# Patient Record
Sex: Female | Born: 1960
Health system: Southern US, Community
[De-identification: ages and names within clinical notes are randomized; demographics above are authoritative.]

## PROBLEM LIST (undated history)

## (undated) DIAGNOSIS — R011 Cardiac murmur, unspecified: Secondary | ICD-10-CM

## (undated) DIAGNOSIS — N816 Rectocele: Secondary | ICD-10-CM

## (undated) DIAGNOSIS — Z86010 Personal history of colonic polyps: Secondary | ICD-10-CM

## (undated) DIAGNOSIS — M6530 Trigger finger, unspecified finger: Secondary | ICD-10-CM

## (undated) DIAGNOSIS — M1990 Unspecified osteoarthritis, unspecified site: Secondary | ICD-10-CM

## (undated) DIAGNOSIS — I639 Cerebral infarction, unspecified: Secondary | ICD-10-CM

## (undated) DIAGNOSIS — G4700 Insomnia, unspecified: Secondary | ICD-10-CM

## (undated) DIAGNOSIS — M792 Neuralgia and neuritis, unspecified: Secondary | ICD-10-CM

## (undated) DIAGNOSIS — N200 Calculus of kidney: Secondary | ICD-10-CM

## (undated) DIAGNOSIS — I10 Essential (primary) hypertension: Secondary | ICD-10-CM

## (undated) DIAGNOSIS — G459 Transient cerebral ischemic attack, unspecified: Secondary | ICD-10-CM

## (undated) DIAGNOSIS — N289 Disorder of kidney and ureter, unspecified: Secondary | ICD-10-CM

## (undated) DIAGNOSIS — E785 Hyperlipidemia, unspecified: Secondary | ICD-10-CM

## (undated) DIAGNOSIS — K589 Irritable bowel syndrome without diarrhea: Secondary | ICD-10-CM

## (undated) DIAGNOSIS — G43909 Migraine, unspecified, not intractable, without status migrainosus: Secondary | ICD-10-CM

## (undated) DIAGNOSIS — M797 Fibromyalgia: Secondary | ICD-10-CM

## (undated) DIAGNOSIS — F419 Anxiety disorder, unspecified: Secondary | ICD-10-CM

## (undated) HISTORY — DX: Anxiety disorder, unspecified: F41.9

## (undated) HISTORY — DX: Cardiac murmur, unspecified: R01.1

## (undated) HISTORY — DX: Calculus of kidney: N20.0

## (undated) HISTORY — DX: Hyperlipidemia, unspecified: E78.5

## (undated) HISTORY — DX: Cerebral infarction, unspecified: I63.9

## (undated) HISTORY — DX: Insomnia, unspecified: G47.00

## (undated) HISTORY — DX: Irritable bowel syndrome, unspecified: K58.9

---
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
## 2009-02-27 ENCOUNTER — Ambulatory Visit: Payer: Self-pay | Admitting: Radiology

## 2009-02-27 ENCOUNTER — Emergency Department (HOSPITAL_BASED_OUTPATIENT_CLINIC_OR_DEPARTMENT_OTHER): Admission: EM | Admit: 2009-02-27 | Discharge: 2009-02-27 | Payer: Self-pay | Admitting: Emergency Medicine

## 2010-03-01 ENCOUNTER — Emergency Department (HOSPITAL_COMMUNITY)
Admission: EM | Admit: 2010-03-01 | Discharge: 2010-03-01 | Payer: Self-pay | Source: Home / Self Care | Admitting: Family Medicine

## 2010-04-01 HISTORY — PX: ABDOMINAL HYSTERECTOMY: SHX81

## 2010-04-25 ENCOUNTER — Emergency Department (HOSPITAL_COMMUNITY)
Admission: EM | Admit: 2010-04-25 | Discharge: 2010-04-26 | Payer: Self-pay | Source: Home / Self Care | Admitting: Emergency Medicine

## 2010-04-25 LAB — BASIC METABOLIC PANEL
BUN: 13 mg/dL (ref 6–23)
Calcium: 8.9 mg/dL (ref 8.4–10.5)
GFR calc non Af Amer: >60 mL/min (ref >60)
Glucose, Bld: 144 mg/dL — ABNORMAL HIGH (ref 70–99)
Potassium: 3.6 mEq/L (ref 3.5–5.1)
Sodium: 132 mEq/L — ABNORMAL LOW (ref 135–145)

## 2010-04-25 LAB — DIFFERENTIAL
Basophils Absolute: 0 10*3/uL (ref 0.0–0.1)
Eosinophils Absolute: 0.2 10*3/uL (ref 0.0–0.7)
Eosinophils Relative: 3 % (ref 0–5)
Monocytes Absolute: 0.3 10*3/uL (ref 0.1–1.0)

## 2010-04-25 LAB — CBC
MCHC: 32.4 g/dL (ref 30.0–36.0)
Platelets: 246 10*3/uL (ref 150–400)
RDW: 12.8 % (ref 11.5–15.5)
WBC: 5.9 10*3/uL (ref 4.0–10.5)

## 2010-07-04 LAB — DIFFERENTIAL
Basophils Absolute: 0.1 10*3/uL (ref 0.0–0.1)
Basophils Relative: 1 % (ref 0–1)
Eosinophils Absolute: 0.1 10*3/uL (ref 0.0–0.7)
Eosinophils Relative: 2 % (ref 0–5)
Monocytes Absolute: 0.4 10*3/uL (ref 0.1–1.0)
Monocytes Relative: 5 % (ref 3–12)

## 2010-07-04 LAB — COMPREHENSIVE METABOLIC PANEL
ALT: 10 U/L (ref 0–35)
Albumin: 4.1 g/dL (ref 3.5–5.2)
Alkaline Phosphatase: 85 U/L (ref 39–117)
BUN: 11 mg/dL (ref 6–23)
Chloride: 103 mEq/L (ref 96–112)
Potassium: 4.5 mEq/L (ref 3.5–5.1)
Sodium: 141 mEq/L (ref 135–145)
Total Bilirubin: 0.4 mg/dL (ref 0.3–1.2)
Total Protein: 7.4 g/dL (ref 6.0–8.3)

## 2010-07-04 LAB — CBC
HCT: 39.6 % (ref 36.0–46.0)
Hemoglobin: 13.1 g/dL (ref 12.0–15.0)
Platelets: 220 10*3/uL (ref 150–400)
RDW: 12.9 % (ref 11.5–15.5)
WBC: 7.5 10*3/uL (ref 4.0–10.5)

## 2010-07-04 LAB — URINALYSIS, ROUTINE W REFLEX MICROSCOPIC
Bilirubin Urine: NEGATIVE
Glucose, UA: NEGATIVE mg/dL
Hgb urine dipstick: NEGATIVE
Ketones, ur: NEGATIVE mg/dL
Protein, ur: NEGATIVE mg/dL
pH: 6 (ref 5.0–8.0)

## 2010-10-12 ENCOUNTER — Inpatient Hospital Stay (INDEPENDENT_AMBULATORY_CARE_PROVIDER_SITE_OTHER)
Admission: RE | Admit: 2010-10-12 | Discharge: 2010-10-12 | Disposition: A | Discharge status: Home / Self Care | Payer: 59 | Source: Ambulatory Visit | Attending: Family Medicine | Admitting: Family Medicine

## 2010-10-12 DIAGNOSIS — R252 Cramp and spasm: Secondary | ICD-10-CM

## 2011-10-23 ENCOUNTER — Emergency Department (HOSPITAL_COMMUNITY)
Admission: EM | Admit: 2011-10-23 | Discharge: 2011-10-24 | Disposition: A | Discharge status: Home / Self Care | Payer: 59 | Attending: Emergency Medicine | Admitting: Emergency Medicine

## 2011-10-23 ENCOUNTER — Emergency Department (HOSPITAL_COMMUNITY)
Admission: EM | Admit: 2011-10-23 | Discharge: 2011-10-23 | Disposition: A | Discharge status: Nursing Home (Includes ICF) | Payer: 59 | Source: Home / Self Care | Attending: Emergency Medicine | Admitting: Emergency Medicine

## 2011-10-23 ENCOUNTER — Encounter (HOSPITAL_COMMUNITY): Payer: Self-pay | Admitting: *Deleted

## 2011-10-23 ENCOUNTER — Encounter (HOSPITAL_COMMUNITY): Payer: Self-pay

## 2011-10-23 DIAGNOSIS — R1032 Left lower quadrant pain: Secondary | ICD-10-CM | POA: Insufficient documentation

## 2011-10-23 DIAGNOSIS — E119 Type 2 diabetes mellitus without complications: Secondary | ICD-10-CM | POA: Insufficient documentation

## 2011-10-23 DIAGNOSIS — I10 Essential (primary) hypertension: Secondary | ICD-10-CM | POA: Insufficient documentation

## 2011-10-23 DIAGNOSIS — K5900 Constipation, unspecified: Secondary | ICD-10-CM | POA: Insufficient documentation

## 2011-10-23 DIAGNOSIS — R1031 Right lower quadrant pain: Secondary | ICD-10-CM | POA: Insufficient documentation

## 2011-10-23 DIAGNOSIS — R109 Unspecified abdominal pain: Secondary | ICD-10-CM

## 2011-10-23 DIAGNOSIS — Z79899 Other long term (current) drug therapy: Secondary | ICD-10-CM | POA: Insufficient documentation

## 2011-10-23 HISTORY — DX: Neuralgia and neuritis, unspecified: M79.2

## 2011-10-23 HISTORY — DX: Essential (primary) hypertension: I10

## 2011-10-23 LAB — COMPREHENSIVE METABOLIC PANEL
CO2: 24 mEq/L (ref 19–32)
Calcium: 9.3 mg/dL (ref 8.4–10.5)
Creatinine, Ser: 0.75 mg/dL (ref 0.50–1.10)
GFR calc Af Amer: >90 mL/min (ref >90)
GFR calc non Af Amer: >90 mL/min (ref >90)
Glucose, Bld: 89 mg/dL (ref 70–99)
Total Protein: 7.9 g/dL (ref 6.0–8.3)

## 2011-10-23 LAB — URINALYSIS, ROUTINE W REFLEX MICROSCOPIC
Leukocytes, UA: NEGATIVE
Protein, ur: NEGATIVE mg/dL
Urobilinogen, UA: 0.2 mg/dL (ref 0.0–1.0)

## 2011-10-23 LAB — DIFFERENTIAL
Basophils Absolute: 0.1 10*3/uL (ref 0.0–0.1)
Eosinophils Absolute: 0.1 10*3/uL (ref 0.0–0.7)
Eosinophils Relative: 1 % (ref 0–5)
Lymphocytes Relative: 51 % — ABNORMAL HIGH (ref 12–46)
Lymphs Abs: 2.7 10*3/uL (ref 0.7–4.0)
Monocytes Absolute: 0.2 10*3/uL (ref 0.1–1.0)

## 2011-10-23 LAB — CBC
HCT: 40.3 % (ref 36.0–46.0)
MCH: 28.5 pg (ref 26.0–34.0)
MCV: 85 fL (ref 78.0–100.0)
RDW: 12.9 % (ref 11.5–15.5)
WBC: 5.2 10*3/uL (ref 4.0–10.5)

## 2011-10-23 LAB — POCT URINALYSIS DIP (DEVICE)
Hgb urine dipstick: NEGATIVE
Ketones, ur: NEGATIVE mg/dL
Protein, ur: NEGATIVE mg/dL
Specific Gravity, Urine: 1.015 (ref 1.005–1.030)

## 2011-10-23 NOTE — ED Notes (Signed)
i attempted to startan iv. The pt has been stuck x3  Previously and no vein is visible.  Iv team called

## 2011-10-23 NOTE — ED Provider Notes (Signed)
History     CSN: [id number]  Arrival date & time 10/23/11  1914   First MD Initiated Contact with Patient 10/23/11 2141      Chief Complaint  Patient presents with  . Abdominal Pain    (Consider location/radiation/quality/duration/timing/severity/associated sxs/prior treatment) Patient is a 51 y.o. female presenting with abdominal pain. The history is provided by the patient.  Abdominal Pain The primary symptoms of the illness include abdominal pain. The primary symptoms of the illness do not include fever, fatigue, shortness of breath, nausea, vomiting, diarrhea, hematemesis, hematochezia, dysuria or vaginal discharge. The current episode started yesterday. The onset of the illness was gradual. The problem has not changed since onset. The abdominal pain began yesterday. The abdominal pain is located in the LLQ and RLQ. The abdominal pain radiates to the epigastric region. The abdominal pain is relieved by nothing. The abdominal pain is exacerbated by eating.  The patient states that she believes she is currently not pregnant. The patient has not had a change in bowel habit. Symptoms associated with the illness do not include chills, diaphoresis, hematuria or back pain. Significant associated medical issues include diabetes.    Past Medical History  Diagnosis Date  . Diabetes mellitus   . Hypertension   . Nerve pain     Past Surgical History  Procedure Date  . Abdominal hysterectomy     History reviewed. No pertinent family history.  History  Substance Use Topics  . Smoking status: Never Smoker   . Smokeless tobacco: Not on file  . Alcohol Use: No    OB History    Grav Para Term Preterm Abortions TAB SAB Ect Mult Living                  Review of Systems  Constitutional: Negative for fever, chills, diaphoresis and fatigue.  HENT: Negative for ear pain, congestion, sore throat, facial swelling, mouth sores, trouble swallowing, neck pain and neck stiffness.   Eyes:  Negative.   Respiratory: Negative for apnea, cough, chest tightness, shortness of breath and wheezing.   Cardiovascular: Negative for chest pain, palpitations and leg swelling.  Gastrointestinal: Positive for abdominal pain. Negative for nausea, vomiting, diarrhea, blood in stool, hematochezia, abdominal distention and hematemesis.  Genitourinary: Negative for dysuria, hematuria, flank pain, vaginal discharge, difficulty urinating and menstrual problem.  Musculoskeletal: Negative for back pain and gait problem.  Skin: Negative for rash and wound.  Neurological: Negative for dizziness, tremors, seizures, syncope, facial asymmetry, numbness and headaches.  Psychiatric/Behavioral: Negative.   All other systems reviewed and are negative.    Allergies  Enalapril and Relpax  Home Medications   Current Outpatient Rx  Name Route Sig Dispense Refill  . LIPOIC ACID PO Oral Take 1 tablet by mouth daily.    . ASPIRIN EC 81 MG PO TBEC Oral Take 81 mg by mouth every morning.    . DESIPRAMINE HCL PO Oral Take 1 tablet by mouth 3 (three) times daily.    . OMEGA-3 FATTY ACIDS 1000 MG PO CAPS Oral Take 1 g by mouth 3 (three) times daily.     Marland Kitchen GABAPENTIN 600 MG PO TABS Oral Take 600 mg by mouth 3 (three) times daily.    Marland Kitchen GINKGO BILOBA COMPLEX PO Oral Take 1 capsule by mouth daily.     . MELOXICAM 7.5 MG PO TABS Oral Take 7.5 mg by mouth daily as needed. For pain    . METFORMIN HCL 500 MG PO TABS Oral Take 500 mg  by mouth 2 (two) times daily with a meal.    . ADULT MULTIVITAMIN W/MINERALS CH Oral Take 1 tablet by mouth daily.    Marland Kitchen PROPRANOLOL HCL 60 MG PO TABS Oral Take 60 mg by mouth 2 (two) times daily.    . TRAMADOL HCL 50 MG PO TABS Oral Take 50 mg by mouth every 6 (six) hours as needed. For pain    . VITAMIN C 500 MG PO TABS Oral Take 500 mg by mouth daily.      BP 147/116  Pulse 73  Temp 97.6 F (36.4 C) (Oral)  Resp 18  SpO2 96%  Physical Exam  Nursing note and vitals  reviewed. Constitutional: She is oriented to person, place, and time. She appears well-developed and well-nourished. No distress.  HENT:  Head: Normocephalic and atraumatic.  Right Ear: External ear normal.  Left Ear: External ear normal.  Nose: Nose normal.  Mouth/Throat: Oropharynx is clear and moist. No oropharyngeal exudate.  Eyes: Conjunctivae and EOM are normal. Pupils are equal, round, and reactive to light. Right eye exhibits no discharge. Left eye exhibits no discharge.  Neck: Normal range of motion. Neck supple. No JVD present. No tracheal deviation present. No thyromegaly present.  Cardiovascular: Normal rate, regular rhythm, normal heart sounds and intact distal pulses.  Exam reveals no gallop and no friction rub.   No murmur heard. Pulmonary/Chest: Effort normal and breath sounds normal. No respiratory distress. She has no wheezes. She has no rales. She exhibits no tenderness.  Abdominal: Soft. Bowel sounds are normal. She exhibits no distension. There is tenderness in the right lower quadrant and left lower quadrant. There is no rebound and no guarding.  Genitourinary: Vagina normal and uterus normal. Uterus is not deviated. Cervix exhibits no motion tenderness, no discharge and no friability. Right adnexum displays no mass. Left adnexum displays no mass. No erythema or tenderness around the vagina. No foreign body around the vagina. No signs of injury around the vagina. No vaginal discharge found.       RLQ and LLQ pain   Musculoskeletal: Normal range of motion.  Lymphadenopathy:    She has no cervical adenopathy.  Neurological: She is alert and oriented to person, place, and time. No cranial nerve deficit. Coordination normal.  Skin: Skin is warm. No rash noted. She is not diaphoretic.  Psychiatric: She has a normal mood and affect. Her behavior is normal. Judgment and thought content normal.    ED Course  Procedures (including critical care time)  Labs Reviewed   DIFFERENTIAL - Abnormal; Notable for the following:    Neutrophils Relative 42 (*)     Lymphocytes Relative 51 (*)     All other components within normal limits  CBC  COMPREHENSIVE METABOLIC PANEL  URINALYSIS, ROUTINE W REFLEX MICROSCOPIC   No results found.   No diagnosis found.    MDM  51 year old female patient with past medical history diabetes hypertension presents as a transfer from urgent care clinic with lower abdominal pain. Patient says lower bowel pain started last night next left and right sides associated with some epigastric pain. Patient nausea but not vomiting pain is slightly worse with eating no fevers. No vaginal discharge or vaginal bleeding. GU exam normal as above with just right and left lower bowel pain. No bleeding in stool patient with normal bowel movement 2 days ago. Patient with rebound tenderness significant lower quadrant pain differential includes possible appendicitis or diverticulitis we'll assess with CT. Results for orders placed during  the hospital encounter of 10/23/11  CBC      Component Value Range   WBC 5.2  4.0 - 10.5 K/uL   RBC 4.74  3.87 - 5.11 MIL/uL   Hemoglobin 13.5  12.0 - 15.0 g/dL   HCT 16.1  09.6 - 04.5 %   MCV 85.0  78.0 - 100.0 fL   MCH 28.5  26.0 - 34.0 pg   MCHC 33.5  30.0 - 36.0 g/dL   RDW 40.9  81.1 - 91.4 %   Platelets 202  150 - 400 K/uL  DIFFERENTIAL      Component Value Range   Neutrophils Relative 42 (*) 43 - 77 %   Neutro Abs 2.2  1.7 - 7.7 K/uL   Lymphocytes Relative 51 (*) 12 - 46 %   Lymphs Abs 2.7  0.7 - 4.0 K/uL   Monocytes Relative 4  3 - 12 %   Monocytes Absolute 0.2  0.1 - 1.0 K/uL   Eosinophils Relative 1  0 - 5 %   Eosinophils Absolute 0.1  0.0 - 0.7 K/uL   Basophils Relative 1  0 - 1 %   Basophils Absolute 0.1  0.0 - 0.1 K/uL  COMPREHENSIVE METABOLIC PANEL      Component Value Range   Sodium 135  135 - 145 mEq/L   Potassium 4.2  3.5 - 5.1 mEq/L   Chloride 99  96 - 112 mEq/L   CO2 24  19 - 32  mEq/L   Glucose, Bld 89  70 - 99 mg/dL   BUN 11  6 - 23 mg/dL   Creatinine, Ser 7.82  0.50 - 1.10 mg/dL   Calcium 9.3  8.4 - 95.6 mg/dL   Total Protein 7.9  6.0 - 8.3 g/dL   Albumin 3.8  3.5 - 5.2 g/dL   AST 19  0 - 37 U/L   ALT 12  0 - 35 U/L   Alkaline Phosphatase 78  39 - 117 U/L   Total Bilirubin 0.3  0.3 - 1.2 mg/dL   GFR calc non Af Amer >90  >90 mL/min   GFR calc Af Amer >90  >90 mL/min  URINALYSIS, ROUTINE W REFLEX MICROSCOPIC      Component Value Range   Color, Urine YELLOW  YELLOW   APPearance CLEAR  CLEAR   Specific Gravity, Urine 1.008  1.005 - 1.030   pH 5.5  5.0 - 8.0   Glucose, UA NEGATIVE  NEGATIVE mg/dL   Hgb urine dipstick NEGATIVE  NEGATIVE   Bilirubin Urine NEGATIVE  NEGATIVE   Ketones, ur NEGATIVE  NEGATIVE mg/dL   Protein, ur NEGATIVE  NEGATIVE mg/dL   Urobilinogen, UA 0.2  0.0 - 1.0 mg/dL   Nitrite NEGATIVE  NEGATIVE   Leukocytes, UA NEGATIVE  NEGATIVE  ]   Labs normal patient without urinary tract infection. CT results of abdomen still pending at this time will be followed up by Dr. Patria Mane or Dr. Rubin Payor.  Case discussed with Dr. Felicity Coyer, MD 10/23/11 2325

## 2011-10-23 NOTE — ED Notes (Signed)
Pt was sent here from ucc with lower abd pain she has had for several days.  Alert skin warm and dry no distress

## 2011-10-23 NOTE — ED Notes (Signed)
C/o lower abdominal cramping, bloating and nausea since last night.  Denies diarrhea, vomiting, fever or dysuria.  States she was constipated and had enema with good results on Monday.  States normal soft BM this am.

## 2011-10-23 NOTE — ED Notes (Signed)
Pt waiting for results no difficulies

## 2011-10-23 NOTE — ED Provider Notes (Signed)
Chief Complaint  Patient presents with  . Abdominal Pain    History of Present Illness:    The patient is a 51 year old female who has a history since last night of bilateral lower abdominal pain without radiation. The pain is severe, constant, and aching. Nothing particularly makes it worse or better. Her abdomen feels swollen and tight. She's felt nauseated but has not vomited. She notes anorexia. She feels weak and has broken out in a sweat she denies any fever. She tends to have chronic constipation problems but this is no different from usual and she denies diarrhea or blood in the stool. She's had no urinary symptoms or GYN complaints. She is status post hysterectomy 2 years ago.  Review of Systems:  Other than noted above, the patient denies any of the following symptoms: Constitutional:  No fever, chills, fatigue, weight loss or anorexia. Lungs:  No cough or shortness of breath. Heart:  No chest pain, palpitations, syncope or edema.  No cardiac history. Abdomen:  No nausea, vomiting, hematememesis, melena, diarrhea, or hematochezia. GU:  No dysuria, frequency, urgency, or hematuria. Gyn:  No vaginal discharge, itching, abnormal bleeding, dyspareunia, or pelvic pain. Skin:  No rash or itching.  PMFSH:  Past medical history, family history, social history, meds, and allergies were reviewed along with nurse's notes.  No prior abdominal surgeries, past history of GI problems, STDs or GYN problems.  No history of aspirin or NSAID use.  No excessive alcohol intake.  Physical Exam:   Vital signs:  BP 161/88  Pulse 66  Temp 98.3 F (36.8 C) (Oral)  Resp 20  SpO2 99% Gen:  Alert, oriented, in no distress. Lungs:  Breath sounds clear and equal bilaterally.  No wheezes, rales or rhonchi. Heart:  Regular rhythm.  No gallops or murmers.   Abdomen:  Abdomen is slightly distended, soft, and has tenderness with guarding but no rebound in both lower quadrants and the epigastrium. Bowel sounds are  active. No organomegaly or mass. Skin:  Clear, warm and dry.  No rash.  Labs:   Results for orders placed during the hospital encounter of 10/23/11  POCT URINALYSIS DIP (DEVICE)      Component Value Range   Glucose, UA NEGATIVE  NEGATIVE mg/dL   Bilirubin Urine NEGATIVE  NEGATIVE   Ketones, ur NEGATIVE  NEGATIVE mg/dL   Specific Gravity, Urine 1.015  1.005 - 1.030   Hgb urine dipstick NEGATIVE  NEGATIVE   pH 7.0  5.0 - 8.0   Protein, ur NEGATIVE  NEGATIVE mg/dL   Urobilinogen, UA 0.2  0.0 - 1.0 mg/dL   Nitrite NEGATIVE  NEGATIVE   Leukocytes, UA NEGATIVE  NEGATIVE    Assessment:  The encounter diagnosis was Abdominal pain. My differential diagnosis is diverticulitis, appendicitis, or intestinal obstruction. She will need further evaluation at the emergency department for this.  Plan:   1.  The following meds were prescribed:   New Prescriptions   No medications on file   2.  The patient was transferred via shuttle to the emergency department.   Reuben Likes, MD 10/23/11 1900

## 2011-10-23 NOTE — ED Notes (Addendum)
Pt from Urgent Care.  Pt c/o lower abdominal pain that radiates to epigastric area, nausea with no vomiting.  Denies SOB, CP, diarrhea, dysuria.  Urinalysis done today at urgent care.

## 2011-10-24 ENCOUNTER — Encounter (HOSPITAL_COMMUNITY): Payer: Self-pay | Admitting: Radiology

## 2011-10-24 ENCOUNTER — Emergency Department (HOSPITAL_COMMUNITY): Payer: 59

## 2011-10-24 LAB — GC/CHLAMYDIA PROBE AMP, GENITAL: GC Probe Amp, Genital: NEGATIVE

## 2011-10-24 MED ORDER — IOHEXOL 300 MG/ML  SOLN: 20.0000 mL | INTRAMUSCULAR | Status: AC

## 2011-10-24 MED ORDER — MAGNESIUM CITRATE PO SOLN: 296.0000 mL | Freq: Once | ORAL | Status: AC

## 2011-10-24 MED ORDER — POLYETHYLENE GLYCOL 3350 17 GM/SCOOP PO POWD: 17.0000 g | Freq: Every day | ORAL | Status: AC

## 2011-10-24 MED ORDER — IOHEXOL 300 MG/ML  SOLN
100.0000 mL | Freq: Once | INTRAMUSCULAR | Status: AC | PRN
  Administered 2011-10-24: 100 mL via INTRAVENOUS

## 2011-10-24 NOTE — ED Notes (Signed)
Completed drinking contrast and CT notified

## 2011-10-24 NOTE — ED Provider Notes (Signed)
I saw and evaluated the patient, reviewed the resident's note and I agree with the findings and plan.  The patient has mild ongoing left lower quadrant abdominal pain.  CT scan pending at this time.  Care transferred to Dr. Rubin Payor.   1. RLQ abdominal pain   2. LLQ pain    Results for orders placed during the hospital encounter of 10/23/11  CBC      Component Value Range   WBC 5.2  4.0 - 10.5 K/uL   RBC 4.74  3.87 - 5.11 MIL/uL   Hemoglobin 13.5  12.0 - 15.0 g/dL   HCT 52.8  41.3 - 24.4 %   MCV 85.0  78.0 - 100.0 fL   MCH 28.5  26.0 - 34.0 pg   MCHC 33.5  30.0 - 36.0 g/dL   RDW 01.0  27.2 - 53.6 %   Platelets 202  150 - 400 K/uL  DIFFERENTIAL      Component Value Range   Neutrophils Relative 42 (*) 43 - 77 %   Neutro Abs 2.2  1.7 - 7.7 K/uL   Lymphocytes Relative 51 (*) 12 - 46 %   Lymphs Abs 2.7  0.7 - 4.0 K/uL   Monocytes Relative 4  3 - 12 %   Monocytes Absolute 0.2  0.1 - 1.0 K/uL   Eosinophils Relative 1  0 - 5 %   Eosinophils Absolute 0.1  0.0 - 0.7 K/uL   Basophils Relative 1  0 - 1 %   Basophils Absolute 0.1  0.0 - 0.1 K/uL  COMPREHENSIVE METABOLIC PANEL      Component Value Range   Sodium 135  135 - 145 mEq/L   Potassium 4.2  3.5 - 5.1 mEq/L   Chloride 99  96 - 112 mEq/L   CO2 24  19 - 32 mEq/L   Glucose, Bld 89  70 - 99 mg/dL   BUN 11  6 - 23 mg/dL   Creatinine, Ser 6.44  0.50 - 1.10 mg/dL   Calcium 9.3  8.4 - 03.4 mg/dL   Total Protein 7.9  6.0 - 8.3 g/dL   Albumin 3.8  3.5 - 5.2 g/dL   AST 19  0 - 37 U/L   ALT 12  0 - 35 U/L   Alkaline Phosphatase 78  39 - 117 U/L   Total Bilirubin 0.3  0.3 - 1.2 mg/dL   GFR calc non Af Amer >90  >90 mL/min   GFR calc Af Amer >90  >90 mL/min  URINALYSIS, ROUTINE W REFLEX MICROSCOPIC      Component Value Range   Color, Urine YELLOW  YELLOW   APPearance CLEAR  CLEAR   Specific Gravity, Urine 1.008  1.005 - 1.030   pH 5.5  5.0 - 8.0   Glucose, UA NEGATIVE  NEGATIVE mg/dL   Hgb urine dipstick NEGATIVE  NEGATIVE   Bilirubin Urine NEGATIVE  NEGATIVE   Ketones, ur NEGATIVE  NEGATIVE mg/dL   Protein, ur NEGATIVE  NEGATIVE mg/dL   Urobilinogen, UA 0.2  0.0 - 1.0 mg/dL   Nitrite NEGATIVE  NEGATIVE   Leukocytes, UA NEGATIVE  NEGATIVE  WET PREP, GENITAL      Component Value Range   Yeast Wet Prep HPF POC RARE (*) NONE SEEN   Trich, Wet Prep NONE SEEN  NONE SEEN   Clue Cells Wet Prep HPF POC MODERATE (*) NONE SEEN   WBC, Wet Prep HPF POC FEW (*) NONE SEEN     Vania Rea  Patria Mane, MD 10/24/11 [phone]

## 2011-10-24 NOTE — ED Notes (Signed)
Patient up to the bathroom, amb without difficulty. 

## 2012-01-28 ENCOUNTER — Ambulatory Visit (INDEPENDENT_AMBULATORY_CARE_PROVIDER_SITE_OTHER): Payer: Self-pay | Admitting: Family Medicine

## 2012-01-28 ENCOUNTER — Ambulatory Visit: Payer: 59

## 2012-01-28 DIAGNOSIS — E119 Type 2 diabetes mellitus without complications: Secondary | ICD-10-CM

## 2012-01-28 NOTE — Progress Notes (Signed)
Patient presents today for 3 month DM follow-up as part of the employer-sponsored [employer] to Wellness program.  Medications, glucose readings, and lifestyle factors (diet and exercise) have been reviewed.  Details of this visit can by found in [employer] documenting program through [employer] [facility]).  Patient has set a series of goals and will follow-up in 3 months for further review of DM.

## 2012-02-04 ENCOUNTER — Encounter: Payer: Self-pay | Admitting: Family Medicine

## 2012-02-04 NOTE — Progress Notes (Signed)
Patient ID: Nichole Garza, female   DOB: [birth year], 51 y.o.   MRN: [id number] Reviewed and agree with documentation and management.

## 2012-05-28 ENCOUNTER — Emergency Department (HOSPITAL_COMMUNITY): Payer: 59

## 2012-05-28 ENCOUNTER — Observation Stay (HOSPITAL_COMMUNITY)
Admission: EM | Admit: 2012-05-28 | Discharge: 2012-05-30 | Disposition: A | Discharge status: Home / Self Care | Payer: 59 | Attending: Urology | Admitting: Urology

## 2012-05-28 ENCOUNTER — Encounter (HOSPITAL_COMMUNITY): Payer: Self-pay | Admitting: Adult Health

## 2012-05-28 DIAGNOSIS — Z79899 Other long term (current) drug therapy: Secondary | ICD-10-CM | POA: Insufficient documentation

## 2012-05-28 DIAGNOSIS — E119 Type 2 diabetes mellitus without complications: Secondary | ICD-10-CM | POA: Insufficient documentation

## 2012-05-28 DIAGNOSIS — N201 Calculus of ureter: Principal | ICD-10-CM | POA: Insufficient documentation

## 2012-05-28 DIAGNOSIS — Z7982 Long term (current) use of aspirin: Secondary | ICD-10-CM | POA: Insufficient documentation

## 2012-05-28 DIAGNOSIS — N200 Calculus of kidney: Secondary | ICD-10-CM

## 2012-05-28 DIAGNOSIS — N1330 Unspecified hydronephrosis: Secondary | ICD-10-CM | POA: Insufficient documentation

## 2012-05-28 DIAGNOSIS — IMO0001 Reserved for inherently not codable concepts without codable children: Secondary | ICD-10-CM | POA: Insufficient documentation

## 2012-05-28 DIAGNOSIS — I10 Essential (primary) hypertension: Secondary | ICD-10-CM | POA: Insufficient documentation

## 2012-05-28 HISTORY — DX: Fibromyalgia: M79.7

## 2012-05-28 LAB — COMPREHENSIVE METABOLIC PANEL
Alkaline Phosphatase: 85 U/L (ref 39–117)
BUN: 12 mg/dL (ref 6–23)
Calcium: 9.1 mg/dL (ref 8.4–10.5)
GFR calc Af Amer: 55 mL/min — ABNORMAL LOW (ref >90)
Glucose, Bld: 228 mg/dL — ABNORMAL HIGH (ref 70–99)
Potassium: 4 mEq/L (ref 3.5–5.1)
Total Protein: 7.3 g/dL (ref 6.0–8.3)

## 2012-05-28 LAB — URINE MICROSCOPIC-ADD ON

## 2012-05-28 LAB — CBC WITH DIFFERENTIAL/PLATELET
Eosinophils Absolute: 0 10*3/uL (ref 0.0–0.7)
Eosinophils Relative: 0 % (ref 0–5)
HCT: 33.6 % — ABNORMAL LOW (ref 36.0–46.0)
Hemoglobin: 11.2 g/dL — ABNORMAL LOW (ref 12.0–15.0)
Lymphs Abs: 1.2 10*3/uL (ref 0.7–4.0)
MCH: 28.6 pg (ref 26.0–34.0)
MCV: 85.9 fL (ref 78.0–100.0)
Monocytes Relative: 6 % (ref 3–12)
RBC: 3.91 MIL/uL (ref 3.87–5.11)

## 2012-05-28 LAB — URINALYSIS, ROUTINE W REFLEX MICROSCOPIC
Bilirubin Urine: NEGATIVE
Ketones, ur: NEGATIVE mg/dL
Nitrite: NEGATIVE
Protein, ur: NEGATIVE mg/dL
Specific Gravity, Urine: 1.014 (ref 1.005–1.030)
Urobilinogen, UA: 0.2 mg/dL (ref 0.0–1.0)

## 2012-05-28 LAB — LIPASE, BLOOD: Lipase: 24 U/L (ref 11–59)

## 2012-05-28 MED ORDER — HYDROMORPHONE HCL PF 1 MG/ML IJ SOLN
0.5000 mg | INTRAMUSCULAR | Status: DC | PRN
  Administered 2012-05-29 (×3): 0.5 mg via INTRAVENOUS
  Administered 2012-05-29 (×2): 1 mg via INTRAVENOUS
  Filled 2012-05-28 (×5): qty 1

## 2012-05-28 MED ORDER — ZOLPIDEM TARTRATE 5 MG PO TABS: 5.0000 mg | ORAL_TABLET | Freq: Every evening | ORAL | Status: DC | PRN

## 2012-05-28 MED ORDER — HYDROMORPHONE HCL PF 1 MG/ML IJ SOLN: 0.5000 mg | INTRAMUSCULAR | Status: DC | PRN

## 2012-05-28 MED ORDER — HYDROMORPHONE HCL PF 1 MG/ML IJ SOLN
1.0000 mg | Freq: Once | INTRAMUSCULAR | Status: AC
  Administered 2012-05-28: 1 mg via INTRAVENOUS
  Filled 2012-05-28: qty 1

## 2012-05-28 MED ORDER — DEXTROSE-NACL 5-0.45 % IV SOLN
INTRAVENOUS | Status: DC
  Administered 2012-05-28: via INTRAVENOUS

## 2012-05-28 MED ORDER — ONDANSETRON HCL 4 MG/2ML IJ SOLN
4.0000 mg | Freq: Once | INTRAMUSCULAR | Status: AC
  Administered 2012-05-28: 4 mg via INTRAVENOUS
  Filled 2012-05-28: qty 2

## 2012-05-28 MED ORDER — HYDROMORPHONE HCL PF 1 MG/ML IJ SOLN
0.5000 mg | Freq: Once | INTRAMUSCULAR | Status: AC
  Administered 2012-05-28: 0.5 mg via INTRAVENOUS
  Filled 2012-05-28: qty 1

## 2012-05-28 MED ORDER — SODIUM CHLORIDE 0.9 % IV SOLN: INTRAVENOUS | Status: AC

## 2012-05-28 MED ORDER — ACETAMINOPHEN 325 MG PO TABS: 650.0000 mg | ORAL_TABLET | ORAL | Status: DC | PRN

## 2012-05-28 MED ORDER — ONDANSETRON HCL 4 MG/2ML IJ SOLN
4.0000 mg | INTRAMUSCULAR | Status: DC | PRN
  Administered 2012-05-28 – 2012-05-29 (×2): 4 mg via INTRAVENOUS
  Filled 2012-05-28 (×2): qty 2

## 2012-05-28 MED ORDER — SODIUM CHLORIDE 0.9 % IV BOLUS (SEPSIS)
1000.0000 mL | Freq: Once | INTRAVENOUS | Status: AC
  Administered 2012-05-28: 1000 mL via INTRAVENOUS

## 2012-05-28 NOTE — ED Provider Notes (Signed)
History     CSN: [id number]  Arrival date & time 05/28/12  1651   First MD Initiated Contact with Patient 05/28/12 1656      Chief Complaint  Patient presents with  . Abdominal Pain    (Consider location/radiation/quality/duration/timing/severity/associated sxs/prior treatment) Patient is a 52 y.o. female presenting with abdominal pain.  Abdominal Pain  Pt reports about 24hrs of gradually worsening, now severe RUQ, radiating down to RLQ and into R leg worse with movement. Nausea but no vomiting, no diarrhea, constipation or dysuria. No prior history of same.   Past Medical History  Diagnosis Date  . Diabetes mellitus   . Hypertension   . Nerve pain   . Fibromyalgia     Past Surgical History  Procedure Laterality Date  . Abdominal hysterectomy      History reviewed. No pertinent family history.  History  Substance Use Topics  . Smoking status: Never Smoker   . Smokeless tobacco: Not on file  . Alcohol Use: No    OB History   Grav Para Term Preterm Abortions TAB SAB Ect Mult Living                  Review of Systems  Gastrointestinal: Positive for abdominal pain.   All other systems reviewed and are negative except as noted in HPI.   Allergies  Enalapril and Relpax  Home Medications   Current Outpatient Rx  Name  Route  Sig  Dispense  Refill  . Alpha-Lipoic Acid (LIPOIC ACID PO)   Oral   Take 1 tablet by mouth daily.         Marland Kitchen aspirin EC 81 MG tablet   Oral   Take 81 mg by mouth every morning.         . DESIPRAMINE HCL PO   Oral   Take 1 tablet by mouth 3 (three) times daily.         . fish oil-omega-3 fatty acids 1000 MG capsule   Oral   Take 1 g by mouth 3 (three) times daily.          Marland Kitchen gabapentin (NEURONTIN) 600 MG tablet   Oral   Take 600 mg by mouth 3 (three) times daily.         Marland Kitchen GINKGO BILOBA COMPLEX PO   Oral   Take 1 capsule by mouth daily.          Marland Kitchen losartan (COZAAR) 25 MG tablet   Oral   Take 25 mg by mouth  daily.         . meloxicam (MOBIC) 7.5 MG tablet   Oral   Take 7.5 mg by mouth daily as needed. For pain         . metFORMIN (GLUCOPHAGE) 500 MG tablet   Oral   Take 500 mg by mouth 2 (two) times daily with a meal.         . mexiletine (MEXITIL) 150 MG capsule   Oral   Take 150 mg by mouth 3 (three) times daily.         . Multiple Vitamin (MULTIVITAMIN WITH MINERALS) TABS   Oral   Take 1 tablet by mouth daily.         . pravastatin (PRAVACHOL) 10 MG tablet   Oral   Take 10 mg by mouth daily.         . propranolol (INDERAL) 60 MG tablet   Oral   Take 60 mg by mouth 2 (two) times  daily.         . traMADol (ULTRAM) 50 MG tablet   Oral   Take 50 mg by mouth every 6 (six) hours as needed. For pain         . vitamin C (ASCORBIC ACID) 500 MG tablet   Oral   Take 500 mg by mouth daily.           BP 151/81  Pulse 76  Temp(Src) 97.8 F (36.6 C) (Oral)  Resp 18  SpO2 100%  Physical Exam  Nursing note and vitals reviewed. Constitutional: She is oriented to person, place, and time. She appears well-developed and well-nourished.  HENT:  Head: Normocephalic and atraumatic.  Eyes: EOM are normal. Pupils are equal, round, and reactive to light.  Neck: Normal range of motion. Neck supple.  Cardiovascular: Normal rate, normal heart sounds and intact distal pulses.   Pulmonary/Chest: Effort normal and breath sounds normal.  Abdominal: Bowel sounds are normal. She exhibits no distension. There is tenderness (RUQ). There is guarding (RUQ). There is no rebound.  Musculoskeletal: Normal range of motion. She exhibits no edema and no tenderness.  Neurological: She is alert and oriented to person, place, and time. She has normal strength. No cranial nerve deficit or sensory deficit.  Skin: Skin is warm and dry. No rash noted.  Psychiatric: She has a normal mood and affect.    ED Course  Procedures (including critical care time)  Labs Reviewed  CBC WITH  DIFFERENTIAL - Abnormal; Notable for the following:    Hemoglobin 11.2 (*)    HCT 33.6 (*)    Neutrophils Relative 82 (*)    Neutro Abs 8.4 (*)    All other components within normal limits  COMPREHENSIVE METABOLIC PANEL - Abnormal; Notable for the following:    Sodium 130 (*)    Chloride 92 (*)    Glucose, Bld 228 (*)    Creatinine, Ser 1.28 (*)    Albumin 3.3 (*)    Total Bilirubin 0.2 (*)    GFR calc non Af Amer 47 (*)    GFR calc Af Amer 55 (*)    All other components within normal limits  URINALYSIS, ROUTINE W REFLEX MICROSCOPIC - Abnormal; Notable for the following:    Glucose, UA 250 (*)    Hgb urine dipstick TRACE (*)    All other components within normal limits  URINE MICROSCOPIC-ADD ON - Abnormal; Notable for the following:    Squamous Epithelial / LPF FEW (*)    All other components within normal limits  LIPASE, BLOOD   Ct Abdomen Pelvis Wo Contrast  05/28/2012  *RADIOLOGY REPORT*  Clinical Data: Right-sided pain.  Evaluate for obstructive uropathy.  CT ABDOMEN AND PELVIS WITHOUT CONTRAST  Technique:  Multidetector CT imaging of the abdomen and pelvis was performed following the standard protocol without intravenous contrast.  Comparison: Ultrasound from earlier today  Findings: There is atelectasis noted within both lung bases.  There is no focal liver abnormality.  The gallbladder appears normal.  No biliary dilatation.  Normal appearance of the pancreas.  The spleen is within normal limits.  Normal appearance of the adrenal glands. There is a right-sided high attenuation, subcapsular fluid collection encasing the right kidney.  Moderate to marked right-sided hydronephrosis and hydroureter is noted.  Large stone in the distal right ureter measures 7 mm, image 70.  Urinary bladder appears normal.  Previous hysterectomy.  The left kidney appears normal.  Small amount of free fluid noted within the  dependent portion of the pelvis.  There is also new stranding within the perinephric  space surrounding the right kidney.  No upper abdominal adenopathy is noted.  The abdominal aorta has a normal caliber.  There is no pelvic or inguinal adenopathy.  There is moderate distention of the stomach.  The small bowel loops have a normal caliber.  Normal appearance of the colon.  Review of the visualized osseous structures is unremarkable.  IMPRESSION:  1.  Distal right ureteral stone measures 7 mm.  There is associated right-sided hydroureter and hydronephrosis. 2.  High attenuation, subcapsular fluid encasing the right kidney is concerning for subcapsular hematoma.   Original Report Authenticated By: Signa Kell, M.D.    US Abdomen Complete  05/28/2012  *RADIOLOGY REPORT*  Clinical Data:  52 year old female with right-sided abdominal pain.  ABDOMINAL ULTRASOUND COMPLETE  Comparison:  10/24/2011 CT  Findings:  Gallbladder:  The gallbladder is unremarkable. There is no evidence of gallstones, gallbladder wall thickening, or pericholecystic fluid.  Common Bile Duct:  There is no evidence of intrahepatic or extrahepatic biliary dilation. The CBD measures 2.2 mm in greatest diameter.  Liver:  The liver is within normal limits in parenchymal echogenicity. No focal abnormalities are identified.  IVC:  Appears normal.  Pancreas:  Although the pancreas is difficult to visualize in its entirety, no focal pancreatic abnormality is identified.  Spleen:  Within normal limits in size and echotexture.  Right kidney: There is a mildly dilated proximal ureter and fullness of the intrarenal collecting system without identifiable cause on this study.  The right kidney is normal in parenchymal echogenicity.  There is no evidence of solid mass or definite renal calculi.  The right kidney measures 10.3 cm.  Left kidney:  The left kidney is normal in parenchymal echogenicity.  There is no evidence of solid mass, hydronephrosis or definite renal calculi.   The left kidney measures 9.2 cm.  Abdominal Aorta:  No abdominal  aortic aneurysm identified.  There is no evidence of ascites.  IMPRESSION: Dilated proximal right ureter with fullness of the right intrarenal collecting system without identifiable cause on this study.  A distal obstruction/calculus is not excluded and consider CT for further evaluation as indicated.  No other significant abnormalities identified.   Original Report Authenticated By: Harmon Pier, M.D.      1. Kidney stone       MDM  Labs as above. US shows normal gall bladder, but hydronephrosis so sent for CT which shows large stone as above. Discussed with Dr. Brunilda Payor, pain is not well controlled, will likely need stenting. He will accept in transfer to [facility] hospital for pain control overnight. Pt amenable to this plan.         Charles B. Bernette Mayers, MD 05/28/12 2132

## 2012-05-28 NOTE — ED Notes (Signed)
Presents with onset of RUQ abdominal pain that began last night described as cramping and progressive pain radiates to back and goes down to RLQ into right leg. Denies diarrhea and nausea.

## 2012-05-28 NOTE — H&P (Signed)
History and Physical  Chief Complaint:  Right flank and right lower quadrant pain  History of Present Illness: The patient is a 52 years old female who was seen in the emergency room today with a 24 hours history of gradually increasing right-sided abdominal pain. The pain was severe this afternoon and she went to the emergency room. The pain was associated with nausea and radiated to the right lower quadrant and her right leg. She did not have vomiting. A CT scan showed a 7 mm stone in the right distal ureter with hydronephrosis. She was treated with analgesics. However she continued to have pain. She is now admitted for pain control and management of the distal ureteral stone.  Past Medical History  Diagnosis Date  . Diabetes mellitus   . Hypertension   . Nerve pain   . Fibromyalgia    Past Surgical History  Procedure Laterality Date  . Abdominal hysterectomy      Medications: Alpha lipoic, Aspirin, desipramine, gabapentin, Ginko, losartan, metformin, Mexitil, pravastatin, propanolol, tramadol, Vit C. Allergies:  Allergies  Allergen Reactions  . Enalapril Anaphylaxis  . Relpax (Eletriptan) Other (See Comments)    "fainting"    History reviewed. No pertinent family history. Social History:  reports that she has never smoked. She does not have any smokeless tobacco history on file. She reports that she does not drink alcohol or use illicit drugs.  ROS: All systems are reviewed and negative except as noted.   Physical Exam:  Vital signs in last 24 hours: Temp:  [97.8 F (36.6 C)-98.2 F (36.8 C)] 98.2 F (36.8 C) (02/27 2038) Pulse Rate:  [73-79] 73 (02/27 2038) Resp:  [16-18] 18 (02/27 2038) BP: (149-151)/(81-95) 151/81 mmHg (02/27 1707) SpO2:  [100 %] 100 % (02/27 2038) Head: Normal.  Pupils: Equal and reactive to light. Ears, Nose Throat: Are normal.  No cervical adenopathy.  No thyromegaly. Cardiovascular: Skin warm; not flushed Respiratory: Breaths quiet; no  shortness of breath Abdomen: No masses.  Right CVA tenderness.  Right lower quadrant tenderness. Neurological: Normal sensation to touch Musculoskeletal: Normal motor function arms and legs Lymphatics: No inguinal adenopathy Skin: No rashes Genitourinary:Normal female genitalia.  S/P hysterectomy.  Laboratory Data:  Results for orders placed during the hospital encounter of 05/28/12 (from the past 24 hour(s))  CBC WITH DIFFERENTIAL     Status: Abnormal   Collection Time    05/28/12  5:20 PM      Result Value Range   WBC 10.3  4.0 - 10.5 K/uL   RBC 3.91  3.87 - 5.11 MIL/uL   Hemoglobin 11.2 (*) 12.0 - 15.0 g/dL   HCT 21.3 (*) 08.6 - 57.8 %   MCV 85.9  78.0 - 100.0 fL   MCH 28.6  26.0 - 34.0 pg   MCHC 33.3  30.0 - 36.0 g/dL   RDW 46.9  62.9 - 52.8 %   Platelets 244  150 - 400 K/uL   Neutrophils Relative 82 (*) 43 - 77 %   Neutro Abs 8.4 (*) 1.7 - 7.7 K/uL   Lymphocytes Relative 12  12 - 46 %   Lymphs Abs 1.2  0.7 - 4.0 K/uL   Monocytes Relative 6  3 - 12 %   Monocytes Absolute 0.6  0.1 - 1.0 K/uL   Eosinophils Relative 0  0 - 5 %   Eosinophils Absolute 0.0  0.0 - 0.7 K/uL   Basophils Relative 0  0 - 1 %   Basophils Absolute 0.0  0.0 - 0.1 K/uL  COMPREHENSIVE METABOLIC PANEL     Status: Abnormal   Collection Time    05/28/12  5:20 PM      Result Value Range   Sodium 130 (*) 135 - 145 mEq/L   Potassium 4.0  3.5 - 5.1 mEq/L   Chloride 92 (*) 96 - 112 mEq/L   CO2 27  19 - 32 mEq/L   Glucose, Bld 228 (*) 70 - 99 mg/dL   BUN 12  6 - 23 mg/dL   Creatinine, Ser 1.61 (*) 0.50 - 1.10 mg/dL   Calcium 9.1  8.4 - 09.6 mg/dL   Total Protein 7.3  6.0 - 8.3 g/dL   Albumin 3.3 (*) 3.5 - 5.2 g/dL   AST 24  0 - 37 U/L   ALT 19  0 - 35 U/L   Alkaline Phosphatase 85  39 - 117 U/L   Total Bilirubin 0.2 (*) 0.3 - 1.2 mg/dL   GFR calc non Af Amer 47 (*) >90 mL/min   GFR calc Af Amer 55 (*) >90 mL/min  LIPASE, BLOOD     Status: None   Collection Time    05/28/12  5:20 PM      Result  Value Range   Lipase 24  11 - 59 U/L  URINALYSIS, ROUTINE W REFLEX MICROSCOPIC     Status: Abnormal   Collection Time    05/28/12  7:11 PM      Result Value Range   Color, Urine YELLOW  YELLOW   APPearance CLEAR  CLEAR   Specific Gravity, Urine 1.014  1.005 - 1.030   pH 8.0  5.0 - 8.0   Glucose, UA 250 (*) NEGATIVE mg/dL   Hgb urine dipstick TRACE (*) NEGATIVE   Bilirubin Urine NEGATIVE  NEGATIVE   Ketones, ur NEGATIVE  NEGATIVE mg/dL   Protein, ur NEGATIVE  NEGATIVE mg/dL   Urobilinogen, UA 0.2  0.0 - 1.0 mg/dL   Nitrite NEGATIVE  NEGATIVE   Leukocytes, UA NEGATIVE  NEGATIVE  URINE MICROSCOPIC-ADD ON     Status: Abnormal   Collection Time    05/28/12  7:11 PM      Result Value Range   Squamous Epithelial / LPF FEW (*) RARE   WBC, UA 0-2  <3 WBC/hpf   RBC / HPF 0-2  <3 RBC/hpf   No results found for this or any previous visit (from the past 240 hour(s)). Creatinine:  Recent Labs  05/28/12 1720  CREATININE 1.28*    Impression/Assessment:  Right distal ureteral calculus. Right hydronephrosis  Plan:  Cystoscopy, right ureteral stone manipulation  Nichole Garza 05/28/2012, 10:06 PM

## 2012-05-28 NOTE — ED Notes (Signed)
Patient transported to Ultrasound 

## 2012-05-29 ENCOUNTER — Encounter (HOSPITAL_COMMUNITY)
Admission: EM | Disposition: A | Discharge status: Home / Self Care | Payer: Self-pay | Source: Home / Self Care | Attending: Emergency Medicine

## 2012-05-29 ENCOUNTER — Encounter (HOSPITAL_COMMUNITY): Payer: Self-pay

## 2012-05-29 ENCOUNTER — Encounter (HOSPITAL_COMMUNITY): Payer: Self-pay | Admitting: Anesthesiology

## 2012-05-29 ENCOUNTER — Observation Stay (HOSPITAL_COMMUNITY): Payer: 59 | Admitting: Anesthesiology

## 2012-05-29 HISTORY — PX: CYSTOSCOPY WITH RETROGRADE PYELOGRAM, URETEROSCOPY AND STENT PLACEMENT: SHX5789

## 2012-05-29 HISTORY — PX: HOLMIUM LASER APPLICATION: SHX5852

## 2012-05-29 LAB — SURGICAL PCR SCREEN: MRSA, PCR: NEGATIVE

## 2012-05-29 LAB — GLUCOSE, CAPILLARY
Glucose-Capillary: 110 mg/dL — ABNORMAL HIGH (ref 70–99)
Glucose-Capillary: 157 mg/dL — ABNORMAL HIGH (ref 70–99)
Glucose-Capillary: 88 mg/dL (ref 70–99)

## 2012-05-29 SURGERY — CYSTOURETEROSCOPY, WITH RETROGRADE PYELOGRAM AND STENT INSERTION
Anesthesia: General | Site: Ureter | Laterality: Right

## 2012-05-29 MED ORDER — PROPRANOLOL HCL 60 MG PO TABS
60.0000 mg | ORAL_TABLET | Freq: Two times a day (BID) | ORAL | Status: DC
  Administered 2012-05-29: 60 mg via ORAL
  Filled 2012-05-29 (×3): qty 1

## 2012-05-29 MED ORDER — INSULIN ASPART 100 UNIT/ML ~~LOC~~ SOLN
0.0000 [IU] | SUBCUTANEOUS | Status: DC
  Administered 2012-05-29: 3 [IU] via SUBCUTANEOUS

## 2012-05-29 MED ORDER — ACETAMINOPHEN 10 MG/ML IV SOLN
INTRAVENOUS | Status: DC | PRN
  Administered 2012-05-29: 1000 mg via INTRAVENOUS

## 2012-05-29 MED ORDER — CEFAZOLIN SODIUM-DEXTROSE 2-3 GM-% IV SOLR
2.0000 g | INTRAVENOUS | Status: AC
  Administered 2012-05-29: 2 g via INTRAVENOUS

## 2012-05-29 MED ORDER — OMEGA-3 FATTY ACIDS 1000 MG PO CAPS: 1.0000 g | ORAL_CAPSULE | Freq: Three times a day (TID) | ORAL | Status: DC

## 2012-05-29 MED ORDER — IOHEXOL 300 MG/ML  SOLN
INTRAMUSCULAR | Status: DC | PRN
  Administered 2012-05-29: 15 mL via INTRAVENOUS

## 2012-05-29 MED ORDER — OXYCODONE HCL 5 MG/5ML PO SOLN: 5.0000 mg | Freq: Once | ORAL | Status: DC | PRN

## 2012-05-29 MED ORDER — ACETAMINOPHEN 10 MG/ML IV SOLN: 1000.0000 mg | Freq: Once | INTRAVENOUS | Status: DC | PRN

## 2012-05-29 MED ORDER — HYDROCODONE-ACETAMINOPHEN 5-500 MG PO CAPS: 1.0000 | ORAL_CAPSULE | Freq: Four times a day (QID) | ORAL | Status: DC | PRN

## 2012-05-29 MED ORDER — PROPOFOL 10 MG/ML IV BOLUS
INTRAVENOUS | Status: DC | PRN
  Administered 2012-05-29: 115 mg via INTRAVENOUS

## 2012-05-29 MED ORDER — MIDAZOLAM HCL 5 MG/5ML IJ SOLN
INTRAMUSCULAR | Status: DC | PRN
  Administered 2012-05-29: 1.5 mg via INTRAVENOUS

## 2012-05-29 MED ORDER — LACTATED RINGERS IV SOLN
INTRAVENOUS | Status: DC
  Administered 2012-05-29: 15:00:00 via INTRAVENOUS

## 2012-05-29 MED ORDER — GABAPENTIN 300 MG PO CAPS
600.0000 mg | ORAL_CAPSULE | Freq: Three times a day (TID) | ORAL | Status: DC
  Administered 2012-05-29 (×2): 600 mg via ORAL
  Filled 2012-05-29 (×5): qty 2

## 2012-05-29 MED ORDER — SIMVASTATIN 10 MG PO TABS
10.0000 mg | ORAL_TABLET | Freq: Every day | ORAL | Status: DC
  Administered 2012-05-29: 10 mg via ORAL
  Filled 2012-05-29 (×2): qty 1

## 2012-05-29 MED ORDER — ADULT MULTIVITAMIN W/MINERALS CH
1.0000 | ORAL_TABLET | Freq: Every day | ORAL | Status: DC
  Administered 2012-05-29: 1 via ORAL
  Filled 2012-05-29 (×2): qty 1

## 2012-05-29 MED ORDER — OMEGA-3-ACID ETHYL ESTERS 1 G PO CAPS
1.0000 g | ORAL_CAPSULE | Freq: Three times a day (TID) | ORAL | Status: DC
  Administered 2012-05-29 (×2): 1 g via ORAL
  Filled 2012-05-29 (×7): qty 1

## 2012-05-29 MED ORDER — LACTATED RINGERS IV SOLN
INTRAVENOUS | Status: DC | PRN
  Administered 2012-05-29: 13:00:00 via INTRAVENOUS

## 2012-05-29 MED ORDER — SUCCINYLCHOLINE CHLORIDE 20 MG/ML IJ SOLN
INTRAMUSCULAR | Status: DC | PRN
  Administered 2012-05-29: 80 mg via INTRAVENOUS

## 2012-05-29 MED ORDER — ONDANSETRON HCL 4 MG/2ML IJ SOLN
INTRAMUSCULAR | Status: DC | PRN
  Administered 2012-05-29: 4 mg via INTRAVENOUS

## 2012-05-29 MED ORDER — METFORMIN HCL 500 MG PO TABS
500.0000 mg | ORAL_TABLET | Freq: Two times a day (BID) | ORAL | Status: DC
Start: 2012-05-29 — End: 2012-05-30
  Administered 2012-05-29 – 2012-05-30 (×2): 500 mg via ORAL
  Filled 2012-05-29 (×4): qty 1

## 2012-05-29 MED ORDER — VITAMIN C 500 MG PO TABS
500.0000 mg | ORAL_TABLET | Freq: Every day | ORAL | Status: DC
  Administered 2012-05-29: 500 mg via ORAL
  Filled 2012-05-29 (×3): qty 1

## 2012-05-29 MED ORDER — TRAMADOL HCL 50 MG PO TABS: 50.0000 mg | ORAL_TABLET | Freq: Four times a day (QID) | ORAL | Status: DC | PRN

## 2012-05-29 MED ORDER — 0.9 % SODIUM CHLORIDE (POUR BTL) OPTIME
TOPICAL | Status: DC | PRN
  Administered 2012-05-29: 1000 mL

## 2012-05-29 MED ORDER — ASPIRIN EC 81 MG PO TBEC
81.0000 mg | DELAYED_RELEASE_TABLET | Freq: Every morning | ORAL | Status: DC
  Filled 2012-05-29: qty 1

## 2012-05-29 MED ORDER — HYDROMORPHONE HCL PF 1 MG/ML IJ SOLN: 0.2500 mg | INTRAMUSCULAR | Status: DC | PRN

## 2012-05-29 MED ORDER — DESIPRAMINE HCL 50 MG PO TABS
50.0000 mg | ORAL_TABLET | Freq: Two times a day (BID) | ORAL | Status: DC
  Filled 2012-05-29 (×4): qty 1

## 2012-05-29 MED ORDER — OXYCODONE HCL 5 MG PO TABS: 5.0000 mg | ORAL_TABLET | Freq: Once | ORAL | Status: DC | PRN

## 2012-05-29 MED ORDER — GABAPENTIN 600 MG PO TABS
600.0000 mg | ORAL_TABLET | Freq: Three times a day (TID) | ORAL | Status: DC
  Filled 2012-05-29 (×2): qty 1

## 2012-05-29 MED ORDER — LOSARTAN POTASSIUM 25 MG PO TABS
25.0000 mg | ORAL_TABLET | Freq: Every day | ORAL | Status: DC
  Administered 2012-05-29: 25 mg via ORAL
  Filled 2012-05-29 (×2): qty 1

## 2012-05-29 MED ORDER — MEPERIDINE HCL 50 MG/ML IJ SOLN: 6.2500 mg | INTRAMUSCULAR | Status: DC | PRN

## 2012-05-29 MED ORDER — MEXILETINE HCL 150 MG PO CAPS
150.0000 mg | ORAL_CAPSULE | Freq: Three times a day (TID) | ORAL | Status: DC
  Administered 2012-05-29: 150 mg via ORAL
  Filled 2012-05-29 (×5): qty 1

## 2012-05-29 MED ORDER — SODIUM CHLORIDE 0.9 % IR SOLN
Status: DC | PRN
  Administered 2012-05-29: 5000 mL

## 2012-05-29 MED ORDER — METOCLOPRAMIDE HCL 5 MG/ML IJ SOLN
INTRAMUSCULAR | Status: DC | PRN
  Administered 2012-05-29: 10 mg via INTRAVENOUS

## 2012-05-29 MED ORDER — FENTANYL CITRATE 0.05 MG/ML IJ SOLN
INTRAMUSCULAR | Status: DC | PRN
  Administered 2012-05-29: 50 ug via INTRAVENOUS

## 2012-05-29 MED ORDER — PROMETHAZINE HCL 25 MG/ML IJ SOLN: 6.2500 mg | INTRAMUSCULAR | Status: DC | PRN

## 2012-05-29 SURGICAL SUPPLY — 23 items
ADAPTER CATH URET PLST 4-6FR (CATHETERS) ×2 IMPLANT
BAG URO CATCHER STRL LF (DRAPE) ×2 IMPLANT
BASKET ZERO TIP NITINOL 2.4FR (BASKET) ×2 IMPLANT
CATH INTERMIT  6FR 70CM (CATHETERS) IMPLANT
CATH URET 5FR 28IN CONE TIP (BALLOONS) ×1
CATH URET 5FR 28IN OPEN ENDED (CATHETERS) ×2 IMPLANT
CATH URET 5FR 70CM CONE TIP (BALLOONS) ×1 IMPLANT
CLOTH BEACON ORANGE TIMEOUT ST (SAFETY) ×2 IMPLANT
DRAPE CAMERA CLOSED 9X96 (DRAPES) ×2 IMPLANT
GLOVE BIOGEL M 7.0 STRL (GLOVE) ×6 IMPLANT
GLOVE SURG SS PI 8.0 STRL IVOR (GLOVE) IMPLANT
GOWN PREVENTION PLUS XLARGE (GOWN DISPOSABLE) IMPLANT
GOWN STRL NON-REIN LRG LVL3 (GOWN DISPOSABLE) ×4 IMPLANT
GOWN STRL REIN XL XLG (GOWN DISPOSABLE) IMPLANT
LASER FIBER DISP (UROLOGICAL SUPPLIES) ×2 IMPLANT
MANIFOLD NEPTUNE II (INSTRUMENTS) ×2 IMPLANT
MARKER SKIN DUAL TIP RULER LAB (MISCELLANEOUS) ×2 IMPLANT
NS IRRIG 1000ML POUR BTL (IV SOLUTION) ×2 IMPLANT
PACK CYSTO (CUSTOM PROCEDURE TRAY) ×2 IMPLANT
SHEATH URET ACCESS 12FR/35CM (UROLOGICAL SUPPLIES) ×2 IMPLANT
STENT CONTOUR 6FRX24X.038 (STENTS) ×2 IMPLANT
TUBING CONNECTING 10 (TUBING) ×2 IMPLANT
WIRE COONS/BENSON .038X145CM (WIRE) ×2 IMPLANT

## 2012-05-29 NOTE — Progress Notes (Signed)
Report called to OR holding; Electronics engineer.

## 2012-05-29 NOTE — Anesthesia Preprocedure Evaluation (Addendum)
Anesthesia Evaluation  Patient identified by MRN, date of birth, ID band Patient awake    Reviewed: Allergy & Precautions, H&P , NPO status , Patient's Chart, lab work & pertinent test results, reviewed documented beta blocker date and time   History of Anesthesia Complications (+) Family history of anesthesia reaction  Airway Mallampati: II TM Distance: >3 FB Neck ROM: Full    Dental  (+) Upper Dentures and Dental Advisory Given   Pulmonary neg pulmonary ROS,  breath sounds clear to auscultation  Pulmonary exam normal       Cardiovascular hypertension, Pt. on medications and Pt. on home beta blockers Rhythm:Regular Rate:Normal     Neuro/Psych  Neuromuscular disease negative psych ROS   GI/Hepatic negative GI ROS, Neg liver ROS,   Endo/Other  diabetes, Type 2, Oral Hypoglycemic Agents  Renal/GU negative Renal ROS  negative genitourinary   Musculoskeletal  (+) Fibromyalgia -  Abdominal   Peds negative pediatric ROS (+)  Hematology negative hematology ROS (+)   Anesthesia Other Findings   Reproductive/Obstetrics negative OB ROS                          Anesthesia Physical Anesthesia Plan  ASA: III  Anesthesia Plan: General   Post-op Pain Management:    Induction: Intravenous  Airway Management Planned: Oral ETT  Additional Equipment:   Intra-op Plan:   Post-operative Plan: Extubation in OR  Informed Consent: I have reviewed the patients History and Physical, chart, labs and discussed the procedure including the risks, benefits and alternatives for the proposed anesthesia with the patient or authorized representative who has indicated his/her understanding and acceptance.   Dental advisory given  Plan Discussed with: CRNA  Anesthesia Plan Comments:         Anesthesia Quick Evaluation

## 2012-05-29 NOTE — Progress Notes (Signed)
Dr. Brunilda Payor present to assess and DC pt home, however she has not transportation home this pm. As noted earlier today, pt's husband in [place] for work, and she does not have anyone else available this pm. She states she is fearful to go home as she just had general anesthesia today. Reassured, and Dr. Brunilda Payor stated to make Dr. Laverle Patter aware if pt truly could not find transportation home.

## 2012-05-29 NOTE — Transfer of Care (Signed)
Immediate Anesthesia Transfer of Care Note  Patient: Nichole Garza  Procedure(s) Performed: Procedure(s): CYSTOSCOPY WITH RETROGRADE PYELOGRAM, URETEROSCOPY AND STENT PLACEMENT (Right) HOLMIUM LASER APPLICATION (Right)  Patient Location: PACU  Anesthesia Type:General  Level of Consciousness: awake, sedated and patient cooperative  Airway & Oxygen Therapy: Patient Spontanous Breathing and Patient connected to face mask oxygen  Post-op Assessment: Report given to PACU RN and Post -op Vital signs reviewed and stable  Post vital signs: Reviewed and stable  Complications: No apparent anesthesia complications

## 2012-05-29 NOTE — Discharge Summary (Signed)
Patient ID: Nichole Garza MRN: [id number] DOB/AGE: 04/20/1960 52 y.o.  Admit date: 05/28/2012 Discharge date: 05/29/2012  Admission Diagnoses: Kidney stone [592.0]  Discharge Diagnoses:  Right distal ureteral calculus, right hydronephrosis.  Diabetes.  Fibromyalgia.  Discharged Condition: Improved  Hospital Course: Admitted last night with severe right sided abdominal pain not relieved by IV analgesics.  CT scan revealed a 7 mm right distal ureteral calculus with hydronephrosis.  She had right ureteral stone extraction today.  She is doing well at this time.  She does not have any more flank pain.  Discharged home on hydrocodone PRN.  Will follow up as outpatient and remove stent in about 2 weeks.  Will send stone for composition analysis.   Significant Diagnostic Studies: Ct Abdomen Pelvis Wo Contrast  05/28/2012  *RADIOLOGY REPORT*  Clinical Data: Right-sided pain.  Evaluate for obstructive uropathy.  CT ABDOMEN AND PELVIS WITHOUT CONTRAST  Technique:  Multidetector CT imaging of the abdomen and pelvis was performed following the standard protocol without intravenous contrast.  Comparison: Ultrasound from earlier today  Findings: There is atelectasis noted within both lung bases.  There is no focal liver abnormality.  The gallbladder appears normal.  No biliary dilatation.  Normal appearance of the pancreas.  The spleen is within normal limits.  Normal appearance of the adrenal glands. There is a right-sided high attenuation, subcapsular fluid collection encasing the right kidney.  Moderate to marked right-sided hydronephrosis and hydroureter is noted.  Large stone in the distal right ureter measures 7 mm, image 70.  Urinary bladder appears normal.  Previous hysterectomy.  The left kidney appears normal.  Small amount of free fluid noted within the dependent portion of the pelvis.  There is also new stranding within the perinephric space surrounding the right kidney.  No upper abdominal  adenopathy is noted.  The abdominal aorta has a normal caliber.  There is no pelvic or inguinal adenopathy.  There is moderate distention of the stomach.  The small bowel loops have a normal caliber.  Normal appearance of the colon.  Review of the visualized osseous structures is unremarkable.  IMPRESSION:  1.  Distal right ureteral stone measures 7 mm.  There is associated right-sided hydroureter and hydronephrosis. 2.  High attenuation, subcapsular fluid encasing the right kidney is concerning for subcapsular hematoma.   Original Report Authenticated By: Signa Kell, M.D.    US Abdomen Complete  05/28/2012  *RADIOLOGY REPORT*  Clinical Data:  52 year old female with right-sided abdominal pain.  ABDOMINAL ULTRASOUND COMPLETE  Comparison:  10/24/2011 CT  Findings:  Gallbladder:  The gallbladder is unremarkable. There is no evidence of gallstones, gallbladder wall thickening, or pericholecystic fluid.  Common Bile Duct:  There is no evidence of intrahepatic or extrahepatic biliary dilation. The CBD measures 2.2 mm in greatest diameter.  Liver:  The liver is within normal limits in parenchymal echogenicity. No focal abnormalities are identified.  IVC:  Appears normal.  Pancreas:  Although the pancreas is difficult to visualize in its entirety, no focal pancreatic abnormality is identified.  Spleen:  Within normal limits in size and echotexture.  Right kidney: There is a mildly dilated proximal ureter and fullness of the intrarenal collecting system without identifiable cause on this study.  The right kidney is normal in parenchymal echogenicity.  There is no evidence of solid mass or definite renal calculi.  The right kidney measures 10.3 cm.  Left kidney:  The left kidney is normal in parenchymal echogenicity.  There is no evidence of solid mass,  hydronephrosis or definite renal calculi.   The left kidney measures 9.2 cm.  Abdominal Aorta:  No abdominal aortic aneurysm identified.  There is no evidence of  ascites.  IMPRESSION: Dilated proximal right ureter with fullness of the right intrarenal collecting system without identifiable cause on this study.  A distal obstruction/calculus is not excluded and consider CT for further evaluation as indicated.  No other significant abnormalities identified.   Original Report Authenticated By: Harmon Pier, M.D.     Treatments: Cystoscopy, right ureteral stone extraction  Discharge Exam: Blood pressure 150/77, pulse 79, temperature 98 F (36.7 C), temperature source Oral, resp. rate 16, height 5' (1.524 m), weight 112 lb 9.6 oz (51.075 kg), SpO2 100.00%. Abdomen: soft, non distended, non tender. Voids well.  Passed more stone fragments.  Disposition: 01-Home or Self Care     Medication List    TAKE these medications       aspirin EC 81 MG tablet  Take 81 mg by mouth every morning.     desipramine 50 MG tablet  Commonly known as:  NORPRAMIN  Take 50 mg by mouth 2 (two) times daily.     fish oil-omega-3 fatty acids 1000 MG capsule  Take 1 g by mouth 3 (three) times daily.     gabapentin 600 MG tablet  Commonly known as:  NEURONTIN  Take 600 mg by mouth 3 (three) times daily.     GINKGO BILOBA COMPLEX PO  Take 1 capsule by mouth daily.     hydrocodone-acetaminophen 5-500 MG per capsule  Commonly known as:  LORCET-HD  Take 1 capsule by mouth every 6 (six) hours as needed for pain.     LIPOIC ACID PO  Take 1 tablet by mouth daily.     losartan 25 MG tablet  Commonly known as:  COZAAR  Take 25 mg by mouth daily.     metFORMIN 500 MG tablet  Commonly known as:  GLUCOPHAGE  Take 500 mg by mouth 2 (two) times daily with a meal.     mexiletine 150 MG capsule  Commonly known as:  MEXITIL  Take 150 mg by mouth 3 (three) times daily.     multivitamin with minerals Tabs  Take 1 tablet by mouth daily.     pravastatin 10 MG tablet  Commonly known as:  PRAVACHOL  Take 10 mg by mouth daily.     propranolol 60 MG tablet  Commonly known  as:  INDERAL  Take 60 mg by mouth 2 (two) times daily.     traMADol 50 MG tablet  Commonly known as:  ULTRAM  Take 50 mg by mouth every 6 (six) hours as needed. For pain     vitamin C 500 MG tablet  Commonly known as:  ASCORBIC ACID  Take 500 mg by mouth daily.         Signed: Lincy Belles-HENRY 05/29/2012, 6:30 PM

## 2012-05-29 NOTE — Anesthesia Postprocedure Evaluation (Signed)
Anesthesia Post Note  Patient: Chloie Shaw-Falconer  Procedure(s) Performed: Procedure(s) (LRB): CYSTOSCOPY WITH RETROGRADE PYELOGRAM, URETEROSCOPY AND STENT PLACEMENT (Right) HOLMIUM LASER APPLICATION (Right)  Anesthesia type: General  Patient location: PACU  Post pain: Pain level controlled  Post assessment: Post-op Vital signs reviewed  Last Vitals: BP 133/73  Pulse 87  Temp(Src) 36.3 C (Oral)  Resp 16  Ht 5' (1.524 m)  Wt 112 lb 9.6 oz (51.075 kg)  BMI 21.99 kg/m2  SpO2 98%  Post vital signs: Reviewed  Level of consciousness: sedated  Complications: No apparent anesthesia complications

## 2012-05-30 LAB — GLUCOSE, CAPILLARY
Glucose-Capillary: 128 mg/dL — ABNORMAL HIGH (ref 70–99)
Glucose-Capillary: 100 mg/dL — ABNORMAL HIGH (ref 70–99)

## 2012-05-30 NOTE — Progress Notes (Signed)
Patient ID: Nichole Garza, female   DOB: [birth year], 52 y.o.   MRN: [id number]  Pt doing well.  She was discharged by Dr. Brunilda Payor last night but did not have a ride.  She has remained stable overnight and will be discharged this morning.

## 2012-06-01 ENCOUNTER — Encounter (HOSPITAL_COMMUNITY): Payer: Self-pay | Admitting: Urology

## 2012-06-03 NOTE — Op Note (Signed)
Nichole Garza is a 52 y.o.    Pre-op diagnosis: Right distal ureteral calculus, right hydronephrosis  Postop diagnosis: Same  Procedure done: Cystoscopy, right retrograde pyelogram, ureteroscopy with holmium laser of right distal ureteral calculus, stone extraction, insertion of double-J stent  Surgeon: Wendie Simmer. Nesi  Anesthesia: General  Date of procedure: 05/29/2012  Indication: Patient is a 52 years old female who presented to the emergency room on February 27 with severe right flank pain radiating to the right lower quadrant and associated with nausea and vomiting. CT scan showed moderate right hydronephrosis secondary to a 7 mm distal ureteral calculus. She was given analgesics. However she continued to complain of pain. She was admitted for further management of the pain and stone manipulation. She was brought to the OR for the procedure. The procedure, the risks, benefits were explained to the patient. The risks include but are not limited to hemorrhage, infection, ureteral injury, inability to extract the stone. She understands and wishes to proceed  Procedure: Patient was identified by her wrist band and proper timeout was taken.  Under general anesthesia she was prepped and draped and placed in the dorsolithotomy position. A panendoscope was inserted in the bladder. The bladder mucosa is normal. There is no stone or tumor in the bladder. The ureteral orifices are in normal position and shape.  Right retrograde pyelogram:  A cone-tip catheter was passed through the cystoscope and the right ureteral orifice. 5 cc of Omnipaque were then injected through the cone-tip catheter. There is a filling defect in the distal ureter consistent with the known ureteral stone. The ureter proximal to the filling defect is moderately dilated. I did not inject the contrast under pressure and therefore the proximal ureter and calyces were not visualized. The cone-tip catheter was then removed. A  sensor wire was passed through the cystoscope and the right ureter. The cystoscope was then removed.  A semirigid ureteroscope was then passed in the bladder and through the right ureteral orifice. The ureteroscope was advanced in the distal ureter and the stone was identified. With a 365 microfiber holmium laser the stone was then fragmented in multiple small stone fragments. The stone fragments were then extracted with the Nitinol basket. Some minute stone fragments remained in the ureter. There are small enough for her to pass then spontaneously.  Contrast was then injected through the ureteroscope. There was no evidence of extravasation of contrast. The proximal ureter and calyces are moderately dilated. The ureteroscope was then removed.  The sensor wire was backloaded into the cystoscope and a #[id number] [id number] double-J stent was passed over the sensor wire. When the tip of the double-J stent reached the  collecting system the sensor wire was removed. The proximal curl of the double-J stent is in the renal pelvis and the distal curl is in the bladder.  The bladder was then emptied and the cystoscope removed.  The patient tolerated the procedure well and left the OR in satisfactory condition to postanesthesia care unit.

## 2012-10-19 ENCOUNTER — Observation Stay (HOSPITAL_BASED_OUTPATIENT_CLINIC_OR_DEPARTMENT_OTHER)
Admission: EM | Admit: 2012-10-19 | Discharge: 2012-10-21 | Disposition: A | Discharge status: Home / Self Care | Payer: 59 | Attending: Internal Medicine | Admitting: Internal Medicine

## 2012-10-19 ENCOUNTER — Encounter (HOSPITAL_BASED_OUTPATIENT_CLINIC_OR_DEPARTMENT_OTHER): Payer: Self-pay | Admitting: Family Medicine

## 2012-10-19 ENCOUNTER — Observation Stay (HOSPITAL_COMMUNITY): Payer: 59

## 2012-10-19 ENCOUNTER — Emergency Department (HOSPITAL_BASED_OUTPATIENT_CLINIC_OR_DEPARTMENT_OTHER): Payer: 59

## 2012-10-19 DIAGNOSIS — Z79899 Other long term (current) drug therapy: Secondary | ICD-10-CM | POA: Insufficient documentation

## 2012-10-19 DIAGNOSIS — G589 Mononeuropathy, unspecified: Secondary | ICD-10-CM | POA: Insufficient documentation

## 2012-10-19 DIAGNOSIS — G629 Polyneuropathy, unspecified: Secondary | ICD-10-CM | POA: Diagnosis present

## 2012-10-19 DIAGNOSIS — H538 Other visual disturbances: Secondary | ICD-10-CM | POA: Insufficient documentation

## 2012-10-19 DIAGNOSIS — E119 Type 2 diabetes mellitus without complications: Secondary | ICD-10-CM | POA: Diagnosis present

## 2012-10-19 DIAGNOSIS — IMO0001 Reserved for inherently not codable concepts without codable children: Secondary | ICD-10-CM | POA: Insufficient documentation

## 2012-10-19 DIAGNOSIS — R519 Headache, unspecified: Secondary | ICD-10-CM | POA: Diagnosis present

## 2012-10-19 DIAGNOSIS — R209 Unspecified disturbances of skin sensation: Secondary | ICD-10-CM | POA: Insufficient documentation

## 2012-10-19 DIAGNOSIS — Z8673 Personal history of transient ischemic attack (TIA), and cerebral infarction without residual deficits: Secondary | ICD-10-CM | POA: Insufficient documentation

## 2012-10-19 DIAGNOSIS — M797 Fibromyalgia: Secondary | ICD-10-CM | POA: Insufficient documentation

## 2012-10-19 DIAGNOSIS — H53149 Visual discomfort, unspecified: Secondary | ICD-10-CM | POA: Insufficient documentation

## 2012-10-19 DIAGNOSIS — I079 Rheumatic tricuspid valve disease, unspecified: Secondary | ICD-10-CM | POA: Insufficient documentation

## 2012-10-19 DIAGNOSIS — R51 Headache: Principal | ICD-10-CM | POA: Insufficient documentation

## 2012-10-19 DIAGNOSIS — I10 Essential (primary) hypertension: Secondary | ICD-10-CM | POA: Insufficient documentation

## 2012-10-19 DIAGNOSIS — I160 Hypertensive urgency: Secondary | ICD-10-CM | POA: Diagnosis present

## 2012-10-19 DIAGNOSIS — G459 Transient cerebral ischemic attack, unspecified: Secondary | ICD-10-CM

## 2012-10-19 DIAGNOSIS — Z7982 Long term (current) use of aspirin: Secondary | ICD-10-CM | POA: Insufficient documentation

## 2012-10-19 DIAGNOSIS — I059 Rheumatic mitral valve disease, unspecified: Secondary | ICD-10-CM | POA: Insufficient documentation

## 2012-10-19 DIAGNOSIS — M79609 Pain in unspecified limb: Secondary | ICD-10-CM | POA: Insufficient documentation

## 2012-10-19 HISTORY — DX: Transient cerebral ischemic attack, unspecified: G45.9

## 2012-10-19 LAB — GLUCOSE, CAPILLARY
Glucose-Capillary: 129 mg/dL — ABNORMAL HIGH (ref 70–99)
Glucose-Capillary: 113 mg/dL — ABNORMAL HIGH (ref 70–99)
Glucose-Capillary: 121 mg/dL — ABNORMAL HIGH (ref 70–99)

## 2012-10-19 LAB — COMPREHENSIVE METABOLIC PANEL
ALT: 23 U/L (ref 0–35)
AST: 24 U/L (ref 0–37)
Albumin: 3.6 g/dL (ref 3.5–5.2)
Calcium: 9.8 mg/dL (ref 8.4–10.5)
Creatinine, Ser: 0.9 mg/dL (ref 0.50–1.10)
Sodium: 138 mEq/L (ref 135–145)
Total Protein: 7.4 g/dL (ref 6.0–8.3)

## 2012-10-19 LAB — CBC
MCH: 28.3 pg (ref 26.0–34.0)
MCHC: 32.9 g/dL (ref 30.0–36.0)
MCHC: 32.8 g/dL (ref 30.0–36.0)
MCV: 85.9 fL (ref 78.0–100.0)
MCV: 85.5 fL (ref 78.0–100.0)
Platelets: 219 10*3/uL (ref 150–400)
Platelets: 238 10*3/uL (ref 150–400)
RBC: 4.74 MIL/uL (ref 3.87–5.11)
RDW: 14.5 % (ref 11.5–15.5)
WBC: 5.8 10*3/uL (ref 4.0–10.5)

## 2012-10-19 LAB — CREATININE, SERUM: GFR calc Af Amer: >90 mL/min (ref >90)

## 2012-10-19 LAB — RAPID URINE DRUG SCREEN, HOSP PERFORMED
Barbiturates: NOT DETECTED
Cocaine: NOT DETECTED
Opiates: POSITIVE — AB
Tetrahydrocannabinol: NOT DETECTED

## 2012-10-19 LAB — PROTIME-INR
INR: 0.92 (ref 0.00–1.49)
Prothrombin Time: 12.2 seconds (ref 11.6–15.2)

## 2012-10-19 LAB — URINALYSIS, ROUTINE W REFLEX MICROSCOPIC
Nitrite: NEGATIVE
Specific Gravity, Urine: 1.02 (ref 1.005–1.030)
Urobilinogen, UA: 0.2 mg/dL (ref 0.0–1.0)
pH: 6 (ref 5.0–8.0)

## 2012-10-19 LAB — DIFFERENTIAL
Basophils Relative: 1 % (ref 0–1)
Eosinophils Absolute: 0.1 10*3/uL (ref 0.0–0.7)
Eosinophils Relative: 1 % (ref 0–5)
Lymphs Abs: 2.1 10*3/uL (ref 0.7–4.0)
Neutrophils Relative %: 51 % (ref 43–77)

## 2012-10-19 LAB — TROPONIN I: Troponin I: <0.3 ng/mL (ref <0.30)

## 2012-10-19 MED ORDER — SIMVASTATIN 5 MG PO TABS
5.0000 mg | ORAL_TABLET | Freq: Every day | ORAL | Status: DC
  Administered 2012-10-19 – 2012-10-20 (×2): 5 mg via ORAL
  Filled 2012-10-19 (×3): qty 1

## 2012-10-19 MED ORDER — TRAMADOL HCL 50 MG PO TABS
50.0000 mg | ORAL_TABLET | Freq: Four times a day (QID) | ORAL | Status: DC | PRN
  Administered 2012-10-19: 50 mg via ORAL
  Filled 2012-10-19: qty 1

## 2012-10-19 MED ORDER — CLOPIDOGREL BISULFATE 75 MG PO TABS
75.0000 mg | ORAL_TABLET | Freq: Every day | ORAL | Status: DC
  Administered 2012-10-19 – 2012-10-21 (×3): 75 mg via ORAL
  Filled 2012-10-19 (×4): qty 1

## 2012-10-19 MED ORDER — KETOROLAC TROMETHAMINE 30 MG/ML IJ SOLN
30.0000 mg | Freq: Four times a day (QID) | INTRAMUSCULAR | Status: DC | PRN
  Administered 2012-10-19 – 2012-10-20 (×3): 30 mg via INTRAVENOUS
  Filled 2012-10-19 (×3): qty 1

## 2012-10-19 MED ORDER — GABAPENTIN 800 MG PO TABS
800.0000 mg | ORAL_TABLET | Freq: Three times a day (TID) | ORAL | Status: DC
  Filled 2012-10-19 (×2): qty 1

## 2012-10-19 MED ORDER — GABAPENTIN 600 MG PO TABS: 600.0000 mg | ORAL_TABLET | Freq: Three times a day (TID) | ORAL | Status: DC

## 2012-10-19 MED ORDER — VITAMIN C 500 MG PO TABS
500.0000 mg | ORAL_TABLET | Freq: Every day | ORAL | Status: DC
  Administered 2012-10-20 – 2012-10-21 (×2): 500 mg via ORAL
  Filled 2012-10-19 (×2): qty 1

## 2012-10-19 MED ORDER — PROPRANOLOL HCL 60 MG PO TABS
60.0000 mg | ORAL_TABLET | Freq: Two times a day (BID) | ORAL | Status: DC
  Administered 2012-10-19: 60 mg via ORAL
  Filled 2012-10-19 (×3): qty 1

## 2012-10-19 MED ORDER — SODIUM CHLORIDE 0.9 % IV SOLN: INTRAVENOUS | Status: DC

## 2012-10-19 MED ORDER — ADULT MULTIVITAMIN W/MINERALS CH
1.0000 | ORAL_TABLET | Freq: Every day | ORAL | Status: DC
  Administered 2012-10-20 – 2012-10-21 (×2): 1 via ORAL
  Filled 2012-10-19 (×2): qty 1

## 2012-10-19 MED ORDER — LOSARTAN POTASSIUM 25 MG PO TABS
50.0000 mg | ORAL_TABLET | Freq: Every day | ORAL | Status: DC
  Filled 2012-10-19: qty 2

## 2012-10-19 MED ORDER — ONDANSETRON HCL 4 MG/2ML IJ SOLN
4.0000 mg | Freq: Once | INTRAMUSCULAR | Status: AC
  Administered 2012-10-19: 4 mg via INTRAVENOUS
  Filled 2012-10-19: qty 2

## 2012-10-19 MED ORDER — MORPHINE SULFATE 4 MG/ML IJ SOLN
4.0000 mg | INTRAMUSCULAR | Status: DC | PRN
  Administered 2012-10-20: 4 mg via INTRAVENOUS
  Filled 2012-10-19: qty 1

## 2012-10-19 MED ORDER — HYDRALAZINE HCL 20 MG/ML IJ SOLN
10.0000 mg | Freq: Once | INTRAMUSCULAR | Status: AC
  Administered 2012-10-19: 10 mg via INTRAVENOUS
  Filled 2012-10-19: qty 1

## 2012-10-19 MED ORDER — PANTOPRAZOLE SODIUM 40 MG PO TBEC
40.0000 mg | DELAYED_RELEASE_TABLET | Freq: Every day | ORAL | Status: DC
  Administered 2012-10-20 – 2012-10-21 (×2): 40 mg via ORAL
  Filled 2012-10-19 (×2): qty 1

## 2012-10-19 MED ORDER — MEXILETINE HCL 150 MG PO CAPS
150.0000 mg | ORAL_CAPSULE | Freq: Three times a day (TID) | ORAL | Status: DC
  Administered 2012-10-19 – 2012-10-21 (×6): 150 mg via ORAL
  Filled 2012-10-19 (×8): qty 1

## 2012-10-19 MED ORDER — ONDANSETRON HCL 4 MG/2ML IJ SOLN: 4.0000 mg | Freq: Three times a day (TID) | INTRAMUSCULAR | Status: DC | PRN

## 2012-10-19 MED ORDER — MORPHINE SULFATE 4 MG/ML IJ SOLN
4.0000 mg | Freq: Once | INTRAMUSCULAR | Status: AC
  Administered 2012-10-19: 4 mg via INTRAVENOUS
  Filled 2012-10-19: qty 1

## 2012-10-19 MED ORDER — ACETAMINOPHEN 325 MG PO TABS: 650.0000 mg | ORAL_TABLET | ORAL | Status: DC | PRN

## 2012-10-19 MED ORDER — DULOXETINE HCL 30 MG PO CPEP
30.0000 mg | ORAL_CAPSULE | Freq: Every day | ORAL | Status: DC
  Administered 2012-10-20 – 2012-10-21 (×2): 30 mg via ORAL
  Filled 2012-10-19 (×2): qty 1

## 2012-10-19 MED ORDER — HYDROCODONE-ACETAMINOPHEN 5-325 MG PO TABS: 1.0000 | ORAL_TABLET | ORAL | Status: DC | PRN

## 2012-10-19 MED ORDER — DESIPRAMINE HCL 50 MG PO TABS: 50.0000 mg | ORAL_TABLET | Freq: Two times a day (BID) | ORAL | Status: DC

## 2012-10-19 MED ORDER — ENOXAPARIN SODIUM 40 MG/0.4ML ~~LOC~~ SOLN
40.0000 mg | SUBCUTANEOUS | Status: DC
  Administered 2012-10-19 – 2012-10-20 (×2): 40 mg via SUBCUTANEOUS
  Filled 2012-10-19 (×3): qty 0.4

## 2012-10-19 MED ORDER — METFORMIN HCL 500 MG PO TABS
500.0000 mg | ORAL_TABLET | Freq: Two times a day (BID) | ORAL | Status: DC
  Administered 2012-10-19 – 2012-10-21 (×4): 500 mg via ORAL
  Filled 2012-10-19 (×6): qty 1

## 2012-10-19 MED ORDER — GABAPENTIN 400 MG PO CAPS
800.0000 mg | ORAL_CAPSULE | Freq: Three times a day (TID) | ORAL | Status: DC
  Administered 2012-10-19 – 2012-10-21 (×6): 800 mg via ORAL
  Filled 2012-10-19 (×8): qty 2

## 2012-10-19 MED ORDER — OMEGA-3-ACID ETHYL ESTERS 1 G PO CAPS
1.0000 g | ORAL_CAPSULE | Freq: Every day | ORAL | Status: DC
  Administered 2012-10-19 – 2012-10-21 (×3): 1 g via ORAL
  Filled 2012-10-19 (×3): qty 1

## 2012-10-19 MED ORDER — LABETALOL HCL 5 MG/ML IV SOLN
10.0000 mg | INTRAVENOUS | Status: DC | PRN
  Administered 2012-10-20: 10 mg via INTRAVENOUS
  Filled 2012-10-19 (×2): qty 4

## 2012-10-19 MED ORDER — LABETALOL HCL 5 MG/ML IV SOLN
20.0000 mg | Freq: Once | INTRAVENOUS | Status: AC
  Administered 2012-10-19: 20 mg via INTRAVENOUS
  Filled 2012-10-19: qty 4

## 2012-10-19 NOTE — ED Notes (Signed)
[facility] has been notified of room 1229 at [facility]

## 2012-10-19 NOTE — Progress Notes (Signed)
  Echocardiogram 2D Echocardiogram has been performed.  Nichole Garza 10/19/2012, 2:54 PM

## 2012-10-19 NOTE — H&P (Signed)
[facility] History and Physical  Nichole Garza YNW:[id number] DOB: 1960/10/01 DOA: 10/19/2012  Referring physician: Dr. Manus Gunning PCP: Jackie Plum, MD   Chief Complaint: Headache   History of Present Illness: Man Garza is an 52 y.o. female with a PMH of DM and HTN who awoke this morning with a headache and left sided facial numbness.  Had a similar episode 13 years ago and was diagnosed with a TIA at that time.  Headache is frontal and left sided, rated a 6/10.  There is associated photophobia.  Has had a total of 8 mg of morphine with limited relief of pain.  Has not missed any of her usual medications.  Although she has a history of migraine headaches, her current headache is different than her usual migraine pain.  Review of Systems: Constitutional: No fever, no chills;  Appetite diminished; No weight loss, no weight gain, no fatigue.  HEENT: + blurry vision, no diplopia, no pharyngitis, no dysphagia CV: No chest pain, no palpitations.  Resp: No SOB, no cough. GI: No nausea, no vomiting, no diarrhea, no melena, no hematochezia.  GU: No dysuria, no hematuria. MSK: + chronic myalgias and arthralgias.  Neuro:  + headache, + focal neurological deficits, no history of seizures.  Psych: + depression, no anxiety.  Endo: No heat intolerance, no cold intolerance, no polyuria, no polydipsia  Skin: No rashes, no skin lesions.  Heme: No easy bruising.  Past Medical History Past Medical History  Diagnosis Date  . Diabetes mellitus   . Hypertension   . Nerve pain   . Fibromyalgia   . TIA (transient ischemic attack)      Past Surgical History Past Surgical History  Procedure Laterality Date  . Abdominal hysterectomy    . Cystoscopy with retrograde pyelogram, ureteroscopy and stent placement Right 05/29/2012    Procedure: CYSTOSCOPY WITH RETROGRADE PYELOGRAM, URETEROSCOPY AND STENT PLACEMENT;  Surgeon: Lindaann Slough, MD;  Location: [facility];  Service: Urology;   Laterality: Right;  . Holmium laser application Right 05/29/2012    Procedure: HOLMIUM LASER APPLICATION;  Surgeon: Lindaann Slough, MD;  Location: [facility];  Service: Urology;  Laterality: Right;     Social History: History   Social History  . Marital Status: Married    Spouse Name: Nichole Garza    Number of Children: 1  . Years of Education: N/A   Occupational History  . NURSE [employer]   Social History Main Topics  . Smoking status: Never Smoker   . Smokeless tobacco: Not on file  . Alcohol Use: No  . Drug Use: No  . Sexually Active: Yes    Birth Control/ Protection: Post-menopausal   Other Topics Concern  . Not on file   Social History Narrative   Married.  Lives with husband.  Works as a Lawyer.    Family History:  Family History  Problem Relation Age of Onset  . Diabetes Mother   . Diabetes Father   . Cancer Brother     Allergies: Enalapril and Relpax  Meds: Prior to Admission medications   Medication Sig Start Date End Date Taking? Authorizing Provider  DULoxetine (CYMBALTA) 30 MG capsule Take 30 mg by mouth daily.   Yes Historical Provider, MD  Alpha-Lipoic Acid (LIPOIC ACID PO) Take 1 tablet by mouth daily.    Historical Provider, MD  aspirin EC 81 MG tablet Take 81 mg by mouth every morning.    Historical Provider, MD  desipramine (NORPRAMIN) 50 MG tablet Take 50 mg by mouth  2 (two) times daily.    Historical Provider, MD  fish oil-omega-3 fatty acids 1000 MG capsule Take 1 g by mouth 3 (three) times daily.     Historical Provider, MD  gabapentin (NEURONTIN) 600 MG tablet Take 600 mg by mouth 3 (three) times daily.    Historical Provider, MD  Edrick Oh BILOBA COMPLEX PO Take 1 capsule by mouth daily.     Historical Provider, MD  hydrocodone-acetaminophen (LORCET-HD) 5-500 MG per capsule Take 1 capsule by mouth every 6 (six) hours as needed for pain. 05/29/12   Lindaann Slough, MD  losartan (COZAAR) 25 MG tablet Take 25 mg by mouth daily.    Historical  Provider, MD  metFORMIN (GLUCOPHAGE) 500 MG tablet Take 500 mg by mouth 2 (two) times daily with a meal.    Historical Provider, MD  mexiletine (MEXITIL) 150 MG capsule Take 150 mg by mouth 3 (three) times daily.    Historical Provider, MD  Multiple Vitamin (MULTIVITAMIN WITH MINERALS) TABS Take 1 tablet by mouth daily.    Historical Provider, MD  pravastatin (PRAVACHOL) 10 MG tablet Take 10 mg by mouth daily.    Historical Provider, MD  propranolol (INDERAL) 60 MG tablet Take 60 mg by mouth 2 (two) times daily.    Historical Provider, MD  traMADol (ULTRAM) 50 MG tablet Take 50 mg by mouth every 6 (six) hours as needed. For pain    Historical Provider, MD  vitamin C (ASCORBIC ACID) 500 MG tablet Take 500 mg by mouth daily.    Historical Provider, MD    Physical Exam: Filed Vitals:   10/19/12 1400 10/19/12 1415 10/19/12 1430 10/19/12 1445  BP: 152/83 165/72 123/76 148/85  Pulse: 77 73 79 67  Temp:      TempSrc:      Resp: 21 24 16 19   Height: 5' (1.524 m)     Weight: 49.1 kg (108 lb 3.9 oz)     SpO2: 100% 100% 100% 100%     Physical Exam: Blood pressure 148/85, pulse 67, temperature 98.3 F (36.8 C), temperature source Oral, resp. rate 19, height 5' (1.524 m), weight 49.1 kg (108 lb 3.9 oz), SpO2 100.00%. Gen: Mild-moderate distress. Head: Normocephalic, atraumatic. Eyes: PERRL, EOMI, sclerae nonicteric. Visual fields full. Mouth: Oropharynx clear. Tongue midline. Palate rises symmetrically. Neck: Supple, no thyromegaly, no lymphadenopathy, no jugular venous distention. Chest: Lungs clear to auscultation bilaterally. CV: Heart sounds are regular. No murmurs, rubs, or gallops. Abdomen: Soft, nontender, nondistended with normal active bowel sounds. Extremities: Extremities Skin: Warm and dry. Neuro: Alert and oriented times 3; cranial nerves II through XII grossly intact. No pronator drift. 5/5 strength bilaterally. Subjective numbness to left cheek. Psych: Mood and affect  anxious.  Labs on Admission:  Basic Metabolic Panel:  Recent Labs Lab 10/19/12 1100  NA 138  K 3.9  CL 100  CO2 29  GLUCOSE 125*  BUN 14  CREATININE 0.90  CALCIUM 9.8   Liver Function Tests:  Recent Labs Lab 10/19/12 1100  AST 24  ALT 23  ALKPHOS 113  BILITOT <0.1*  PROT 7.4  ALBUMIN 3.6   CBC:  Recent Labs Lab 10/19/12 1025 10/19/12 1427  WBC 5.1 5.8  NEUTROABS 2.6  --   HGB 13.4 12.4  HCT 40.7 37.8  MCV 85.9 85.5  PLT 219 238   Cardiac Enzymes:  Recent Labs Lab 10/19/12 1100  TROPONINI <0.30    CBG:  Recent Labs Lab 10/19/12 1026  GLUCAP 129*    Radiological  Exams on Admission: Ct Head Wo Contrast  10/19/2012   *RADIOLOGY REPORT*  Clinical Data: Headache.  Hypertension.  CT HEAD WITHOUT CONTRAST  Technique:  Contiguous axial images were obtained from the base of the skull through the vertex without contrast.  Comparison: 04/26/2010.  Findings: The ventricles are normal.  No extra-axial fluid collections are seen.  The brainstem and cerebellum are unremarkable.  No acute intracranial findings such as infarction or hemorrhage.  No mass lesions.  The bony calvarium is intact.  The visualized paranasal sinuses and mastoid air cells are clear.  IMPRESSION: No acute intracranial findings or mass lesion.   Original Report Authenticated By: Rudie Meyer, M.D.    EKG: Independently reviewed. Normal sinus rhythm at 76 beats per minute. By atrial enlargement. Nonspecific T-wave abnormalities noted.  Assessment/Plan Principal Problem:   Headache(784.0) with left-sided paresthesias to the cheek -Admit for observation. Given history of TIA, and risk factors for cerebrovascular disease, we'll initiate a full neurological evaluation with MRI/MRA of the brain, two-dimensional echocardiogram, and carotid Dopplers. -Check fasting lipid panel and hemoglobin A1c. -Treat headaches symptomatically. -Continue home medications to lower blood pressure with when  necessary labetalol. Active Problems:   Diabetes mellitus -Continue metformin. Check CBGs q. a.c. and at bedtime. Add sliding scale insulin if greater than 140.   Hypertensive urgency -Continue home medications with when necessary labetalol. Blood pressure improved.   Neuropathy / Fibromyalgia -Continue Cymbalta and Neurontin.  Code Status: Full. Family Communication: Husband Delroy.   Disposition Plan: Home when stable.  Time spent: 1 hour.  Aalijah Mims [facility] Pager [phone]  If 7PM-7AM, please contact night-coverage [email or web address] Password [facility] 10/19/2012, 3:05 PM

## 2012-10-19 NOTE — ED Notes (Signed)
BP prior to Labetalol BP 152/139 given over 5 minutes.  C/o of HA. Moves all extremities without difficulties.  Hand grips equal bil.  Oxygen at 2l. Friend in room with patient. . States head hurts across forehead and then down across nose. Bp after 10 min 168/92.  States feels sleepy and wanted head lowered.  C/o right side of chin hurting. Placed on Cardiac monitor NS noted.

## 2012-10-19 NOTE — ED Notes (Signed)
Pt c/o headache since "middle of the night", photophobia, left side of face numb. No facial asymmetry, equal hand grips and leg strength at present. Pt reports h/o migraines and similar issue.

## 2012-10-19 NOTE — Progress Notes (Signed)
Name: Shayana Hornstein MRN: [id number] DOB: May 14, 1960  ELECTRONIC ICU PHYSICIAN NOTE  Problem:  Best practice review re GI prophylaxis  Intervention:  Protonix 40 mg po daily   Sandrea Hughs 10/19/2012, 4:32 PM

## 2012-10-19 NOTE — ED Provider Notes (Signed)
History    CSN: [id number] Arrival date & time 10/19/12  4098  First MD Initiated Contact with Patient 10/19/12 [phone]     Chief Complaint  Patient presents with  . Headache  . Hypertension   (Consider location/radiation/quality/duration/timing/severity/associated sxs/prior Treatment) HPI Comments: Patient awoke around 12:00 with diffuse headache is gradually worsened. Is associated with photophobia left-sided facial numbness, , "stiffness" in the legs. Denies any chest pain or shortness of breath. States compliance with her blood pressure medications. She is a diabetic and hypertensive. Reports she's had a headache 13 years ago that was similar to this associated with numbness as well. This was attributed to TIA. Denies having any facial numbness with her headaches until today except for that one episode. She does report frequent headaches and fibromyalgia. She denies any vision change, fever or chills.  The history is provided by the patient.   Past Medical History  Diagnosis Date  . Diabetes mellitus   . Hypertension   . Nerve pain   . Fibromyalgia   . TIA (transient ischemic attack)    Past Surgical History  Procedure Laterality Date  . Abdominal hysterectomy    . Cystoscopy with retrograde pyelogram, ureteroscopy and stent placement Right 05/29/2012    Procedure: CYSTOSCOPY WITH RETROGRADE PYELOGRAM, URETEROSCOPY AND STENT PLACEMENT;  Surgeon: Lindaann Slough, MD;  Location: [facility];  Service: Urology;  Laterality: Right;  . Holmium laser application Right 05/29/2012    Procedure: HOLMIUM LASER APPLICATION;  Surgeon: Lindaann Slough, MD;  Location: [facility];  Service: Urology;  Laterality: Right;   Family History  Problem Relation Age of Onset  . Diabetes Mother   . Diabetes Father   . Cancer Brother    History  Substance Use Topics  . Smoking status: Never Smoker   . Smokeless tobacco: Not on file  . Alcohol Use: No   OB History   Grav Para Term Preterm Abortions TAB  SAB Ect Mult Living                 Review of Systems  Constitutional: Negative for fever, activity change and appetite change.  HENT: Negative for congestion and rhinorrhea.   Eyes: Positive for photophobia.  Respiratory: Negative for cough, chest tightness and shortness of breath.   Cardiovascular: Negative for chest pain.  Gastrointestinal: Negative for nausea, vomiting and abdominal pain.  Genitourinary: Negative for dysuria, hematuria, vaginal bleeding and vaginal discharge.  Musculoskeletal: Negative for back pain.  Skin: Negative for rash.  Neurological: Positive for dizziness, light-headedness and headaches.  A complete 10 system review of systems was obtained and all systems are negative except as noted in the HPI and PMH.    Allergies  Enalapril and Relpax  Home Medications   No current outpatient prescriptions on file. BP 148/85  Pulse 67  Temp(Src) 98.3 F (36.8 C) (Oral)  Resp 19  Ht 5' (1.524 m)  Wt 108 lb 3.9 oz (49.1 kg)  BMI 21.14 kg/m2  SpO2 100% Physical Exam  Constitutional: She is oriented to person, place, and time. She appears well-developed. No distress.  HENT:  Head: Normocephalic and atraumatic.  Mouth/Throat: Oropharynx is clear and moist. No oropharyngeal exudate.  photophobic  Eyes: Conjunctivae and EOM are normal. Pupils are equal, round, and reactive to light.  Neck: Normal range of motion. Neck supple.  No meningismus  Cardiovascular: Normal rate, regular rhythm and normal heart sounds.   No murmur heard. Pulmonary/Chest: Effort normal and breath sounds normal. No respiratory distress.  Abdominal:  Soft. There is no tenderness. There is no rebound and no guarding.  Musculoskeletal: Normal range of motion. She exhibits no edema and no tenderness.  Neurological: She is alert and oriented to person, place, and time. No cranial nerve deficit. She exhibits normal muscle tone. Coordination normal.  CN 2-12 intact, no ataxia on finger to nose,  no nystagmus, 5/5 strength throughout, no pronator drift, Romberg negative, normal gait.   Skin: Skin is warm.    ED Course  Procedures (including critical care time) Labs Reviewed  GLUCOSE, CAPILLARY - Abnormal; Notable for the following:    Glucose-Capillary 129 (*)    All other components within normal limits  COMPREHENSIVE METABOLIC PANEL - Abnormal; Notable for the following:    Glucose, Bld 125 (*)    Total Bilirubin <0.1 (*)    GFR calc non Af Amer 72 (*)    GFR calc Af Amer 83 (*)    All other components within normal limits  CREATININE, SERUM - Abnormal; Notable for the following:    GFR calc non Af Amer 79 (*)    All other components within normal limits  MRSA PCR SCREENING  PROTIME-INR  APTT  CBC  DIFFERENTIAL  TROPONIN I  ETHANOL  CBC  URINE RAPID DRUG SCREEN (HOSP PERFORMED)  URINALYSIS, ROUTINE W REFLEX MICROSCOPIC  TROPONIN I  HEMOGLOBIN A1C  URINE RAPID DRUG SCREEN (HOSP PERFORMED)   Ct Head Wo Contrast  10/19/2012   *RADIOLOGY REPORT*  Clinical Data: Headache.  Hypertension.  CT HEAD WITHOUT CONTRAST  Technique:  Contiguous axial images were obtained from the base of the skull through the vertex without contrast.  Comparison: 04/26/2010.  Findings: The ventricles are normal.  No extra-axial fluid collections are seen.  The brainstem and cerebellum are unremarkable.  No acute intracranial findings such as infarction or hemorrhage.  No mass lesions.  The bony calvarium is intact.  The visualized paranasal sinuses and mastoid air cells are clear.  IMPRESSION: No acute intracranial findings or mass lesion.   Original Report Authenticated By: Rudie Meyer, M.D.   1. Hypertensive urgency   2. TIA (transient ischemic attack)     MDM  Gradual onset headache with left-sided facial numbness and tingling. No facial asymmetry. Equal hand grips. Last seen normal at midnight.  CT head negative. Patient reports facial numbness has improved throughout her stay in the ED.  Headache persists. She is hypertensive but states compliance with her medications.  Improvement in blood pressure with labetalol to 150 systolic. Patient's numbness has improved though her headache persists. This is different than her usual migraine headaches. Low suspicion for subarachnoid hemorrhage. She has significant cerebrovascular risk factors including diabetes, hypertension, previous TIA. Observation admission for further workup discussed with Dr. Brien Few.   Date: 10/19/2012  Rate: 76  Rhythm: normal sinus rhythm  QRS Axis: normal  Intervals: normal  ST/T Wave abnormalities: nonspecific ST/T changes  Conduction Disutrbances:none  Narrative Interpretation: Inferior lateral T wave inversions, no comparison  Old EKG Reviewed: none available       Glynn Octave, MD 10/19/12 1535

## 2012-10-19 NOTE — Progress Notes (Signed)
*  PRELIMINARY RESULTS* Vascular Ultrasound Carotid Duplex (Doppler) has been completed.  Preliminary findings: Bilateral:  Less than 39% ICA stenosis.  Vertebral artery flow is antegrade.      Farrel Demark, RDMS, RVT  10/19/2012, 4:13 PM

## 2012-10-20 LAB — GLUCOSE, CAPILLARY: Glucose-Capillary: 129 mg/dL — ABNORMAL HIGH (ref 70–99)

## 2012-10-20 LAB — SEDIMENTATION RATE: Sed Rate: 22 mm/hr (ref 0–22)

## 2012-10-20 LAB — LIPID PANEL
HDL: 66 mg/dL (ref >39)
LDL Cholesterol: 79 mg/dL (ref 0–99)
Total CHOL/HDL Ratio: 2.8 RATIO
VLDL: 37 mg/dL (ref 0–40)

## 2012-10-20 MED ORDER — POLYETHYLENE GLYCOL 3350 17 G PO PACK
17.0000 g | PACK | Freq: Every day | ORAL | Status: DC | PRN
  Administered 2012-10-20: 17 g via ORAL
  Filled 2012-10-20 (×2): qty 1

## 2012-10-20 MED ORDER — OXYCODONE HCL 5 MG PO TABS: 10.0000 mg | ORAL_TABLET | ORAL | Status: DC | PRN

## 2012-10-20 MED ORDER — LOSARTAN POTASSIUM 50 MG PO TABS
100.0000 mg | ORAL_TABLET | Freq: Every day | ORAL | Status: DC
  Administered 2012-10-20 – 2012-10-21 (×2): 100 mg via ORAL
  Filled 2012-10-20 (×2): qty 2

## 2012-10-20 MED ORDER — CYCLOBENZAPRINE HCL 5 MG PO TABS
5.0000 mg | ORAL_TABLET | Freq: Three times a day (TID) | ORAL | Status: DC
  Administered 2012-10-20 – 2012-10-21 (×3): 5 mg via ORAL
  Filled 2012-10-20 (×5): qty 1

## 2012-10-20 MED ORDER — PROPRANOLOL HCL 80 MG PO TABS
80.0000 mg | ORAL_TABLET | Freq: Two times a day (BID) | ORAL | Status: DC
  Administered 2012-10-20 – 2012-10-21 (×3): 80 mg via ORAL
  Filled 2012-10-20 (×4): qty 1

## 2012-10-20 NOTE — Progress Notes (Signed)
Pt slept well, requested pain medication only once after she was awaken for a temp check.

## 2012-10-20 NOTE — Progress Notes (Signed)
Pt transferred from ICU to room 1440. Agree with this morning's assessment. Will continue to monitor. [place] Mccuen, Lavone Orn, RN

## 2012-10-20 NOTE — Progress Notes (Signed)
[facility] PROGRESS NOTE  Nichole Garza ZOX:[id number] DOB: 11/27/1960 DOA: 10/19/2012 PCP: Jackie Plum, MD  Brief narrative: Nichole Garza is an 52 y.o. female with a PMH of DM and HTN who was admitted on 10/19/2012 for evaluation of headache and left sided facial numbness concerning for at TIA. On initial presentation, the patient's blood pressure was markedly elevated at 200/94.  Assessment/Plan: Principal Problem:  Headache(784.0) with left-sided paresthesias to the cheek  -Admitted for observation. Given history of TIA, and risk factors for cerebrovascular disease, she is status post a full neurological evaluation with MRI/MRA of the brain (negative), two-dimensional echocardiogram (normal EF, no source of embolism), and carotid Dopplers (39% blockage of the right internal carotid artery).  -Fasting lipid panel showed total cholesterol 182, triglycerides 187, HDL 66, and LDL 79. She is on appropriate lipid-lowering therapies. -Hemoglobin A1c shows good glycemic control, 6.6%. -Treat headaches symptomatically. Check sedimentation rate. Suspect atypical migraine. -Continue home medications to lower blood pressure with when necessary labetalol.  -Given risk factor profile, aspirin was changed to Plavix. Active Problems:  Diabetes mellitus  -Continue metformin. CBGs 105-129. Add sliding scale insulin if greater than 140.  Hypertensive urgency  -Continue home medications with when necessary labetalol. Blood pressure improved.  -Propranolol increased to 80 mg twice a day and Cozaar increased to 100 mg daily for better blood pressure control. Neuropathy / Fibromyalgia  -Continue Cymbalta and Neurontin.  Code Status: Full.  Family Communication: No family at the bedside.   Disposition Plan: Home when stable.  Medical Consultants:  None.  Other Consultants:  None.  Anti-infectives:  None.  HPI/Subjective: Nichole Garza continues to have  intermittent headache pain. She slept well until about 4:00 in the morning when she was awakened for labs, and complained of headache at that time. She's also complaining of left lower extremity pain. The numbness in her cheek is currently gone.  Objective: Filed Vitals:   10/20/12 0600 10/20/12 0700 10/20/12 0800 10/20/12 0816  BP: 136/73 188/88 161/96 156/91  Pulse: 65 77 70 75  Temp:      TempSrc:      Resp: 12 22 22 17   Height:      Weight:      SpO2: 99% 98% 99% 99%    Intake/Output Summary (Last 24 hours) at 10/20/12 0842 Last data filed at 10/20/12 0800  Gross per 24 hour  Intake   1540 ml  Output   1100 ml  Net    440 ml    Exam: Gen:  NAD Cardiovascular:  RRR, No M/R/G Respiratory:  Lungs CTAB Gastrointestinal:  Abdomen soft, NT/ND, + BS Extremities:  No C/E/C Neurological: Nonfocal  Data Reviewed: Basic Metabolic Panel:  Recent Labs Lab 10/19/12 1100 10/19/12 1427  NA 138  --   K 3.9  --   CL 100  --   CO2 29  --   GLUCOSE 125*  --   BUN 14  --   CREATININE 0.90 0.83  CALCIUM 9.8  --    GFR Estimated Creatinine Clearance: 56.3 ml/min (by C-G formula based on Cr of 0.83). Liver Function Tests:  Recent Labs Lab 10/19/12 1100  AST 24  ALT 23  ALKPHOS 113  BILITOT <0.1*  PROT 7.4  ALBUMIN 3.6   Coagulation profile  Recent Labs Lab 10/19/12 1025  INR 0.92    CBC:  Recent Labs Lab 10/19/12 1025 10/19/12 1427  WBC 5.1 5.8  NEUTROABS 2.6  --   HGB 13.4 12.4  HCT 40.7 37.8  MCV 85.9 85.5  PLT 219 238   Cardiac Enzymes:  Recent Labs Lab 10/19/12 1100  TROPONINI <0.30   CBG:  Recent Labs Lab 10/19/12 1026 10/19/12 1618 10/19/12 2124 10/20/12 0757  GLUCAP 129* 113* 121* 105*   Hgb A1c  Recent Labs  10/19/12 1427  HGBA1C 6.6*   Lipid Profile  Recent Labs  10/20/12 0320  CHOL 182  HDL 66  LDLCALC 79  TRIG 187*  CHOLHDL 2.8   Microbiology Recent Results (from the past 240 hour(s))  MRSA PCR SCREENING      Status: None   Collection Time    10/19/12  2:00 PM      Result Value Range Status   MRSA by PCR NEGATIVE  NEGATIVE Final   Comment:            The GeneXpert MRSA Assay (FDA     approved for NASAL specimens     only), is one component of a     comprehensive MRSA colonization     surveillance program. It is not     intended to diagnose MRSA     infection nor to guide or     monitor treatment for     MRSA infections.     Procedures and Diagnostic Studies: Ct Head Wo Contrast  10/19/2012   *RADIOLOGY REPORT*  Clinical Data: Headache.  Hypertension.  CT HEAD WITHOUT CONTRAST  Technique:  Contiguous axial images were obtained from the base of the skull through the vertex without contrast.  Comparison: 04/26/2010.  Findings: The ventricles are normal.  No extra-axial fluid collections are seen.  The brainstem and cerebellum are unremarkable.  No acute intracranial findings such as infarction or hemorrhage.  No mass lesions.  The bony calvarium is intact.  The visualized paranasal sinuses and mastoid air cells are clear.  IMPRESSION: No acute intracranial findings or mass lesion.   Original Report Authenticated By: Rudie Meyer, M.D.   Mr [facility] Wo Contrast  10/19/2012   *RADIOLOGY REPORT*  Clinical Data:  Left-sided facial numbness.  Headache.  Blurred vision.  MRI HEAD WITHOUT CONTRAST MRA HEAD WITHOUT CONTRAST  Technique:  Multiplanar, multiecho pulse sequences of the brain and surrounding structures were obtained without intravenous contrast. Angiographic images of the head were obtained using MRA technique without contrast.  Comparison:  Head CT same day  MRI HEAD  Findings:  The brain has a normal appearance without evidence of old or acute infarction, mass lesion, hemorrhage, hydrocephalus or extra-axial collection.  No pituitary mass.  No inflammatory sinus disease.  No skull or skull base lesion.  IMPRESSION: Normal MRI of the brain  MRA HEAD  Findings: Both internal carotid arteries are  widely patent into the brain.  The anterior and middle cerebral vessels are patent without proximal stenosis, aneurysm or vascular malformation.  Both vertebral arteries are patent to the basilar.  No basilar stenosis. Posterior circulation branch vessels are patent and unremarkable.  IMPRESSION: Normal intracranial MR angiography of the large and medium-sized vessels.   Original Report Authenticated By: Paulina Fusi, M.D.   Mri Brain Without Contrast  10/19/2012   *RADIOLOGY REPORT*  Clinical Data:  Left-sided facial numbness.  Headache.  Blurred vision.  MRI HEAD WITHOUT CONTRAST MRA HEAD WITHOUT CONTRAST  Technique:  Multiplanar, multiecho pulse sequences of the brain and surrounding structures were obtained without intravenous contrast. Angiographic images of the head were obtained using MRA technique without contrast.  Comparison:  Head CT same day  MRI HEAD  Findings:  The brain has a normal appearance without evidence of old or acute infarction, mass lesion, hemorrhage, hydrocephalus or extra-axial collection.  No pituitary mass.  No inflammatory sinus disease.  No skull or skull base lesion.  IMPRESSION: Normal MRI of the brain  MRA HEAD  Findings: Both internal carotid arteries are widely patent into the brain.  The anterior and middle cerebral vessels are patent without proximal stenosis, aneurysm or vascular malformation.  Both vertebral arteries are patent to the basilar.  No basilar stenosis. Posterior circulation branch vessels are patent and unremarkable.  IMPRESSION: Normal intracranial MR angiography of the large and medium-sized vessels.   Original Report Authenticated By: Paulina Fusi, M.D.   Two-dimensional echocardiogram  10/19/2012  Study Conclusions  - Left ventricle: The cavity size was normal. Systolic function was normal. The estimated ejection fraction was in the range of 55% to 60%. Wall motion was normal; there were no regional wall motion abnormalities. - Mitral valve: Mild  regurgitation.  Carotid Dopplers  10/19/2012  Summary:  - The vertebral arteries appear patent with antegrade flow. - Findings consistent with less than 39 percent stenosis involving the right internal carotid artery and the left internal carotid artery. - Distal left ICA demonstrates increased velocities which is most likely due to tortuosity of the vessel. No apparent plaque formation.    Scheduled Meds: . clopidogrel  75 mg Oral Q breakfast  . DULoxetine  30 mg Oral Daily  . enoxaparin (LOVENOX) injection  40 mg Subcutaneous Q24H  . gabapentin  800 mg Oral TID  . losartan  50 mg Oral Daily  . metFORMIN  500 mg Oral BID WC  . mexiletine  150 mg Oral TID  . multivitamin with minerals  1 tablet Oral Daily  . omega-3 acid ethyl esters  1 g Oral Daily  . pantoprazole  40 mg Oral Q1200  . propranolol  60 mg Oral BID  . simvastatin  5 mg Oral q1800  . vitamin C  500 mg Oral Daily   Continuous Infusions: . sodium chloride 50 mL (10/19/12 1430)    Time spent: 25 minutes.   LOS: 1 day   Takasha Vetere  [facility] Pager [phone].   *Please note that the hospitalists switch teams on Wednesdays. Please call the flow manager at [phone] if you are having difficulty reaching the hospitalist taking care of this patient as she can update you and provide the most up-to-date pager number of provider caring for the patient. If 8PM-8AM, please contact night-coverage at [email or web address], password [facility]  10/20/2012, 8:42 AM

## 2012-10-21 DIAGNOSIS — E119 Type 2 diabetes mellitus without complications: Secondary | ICD-10-CM

## 2012-10-21 DIAGNOSIS — R51 Headache: Principal | ICD-10-CM

## 2012-10-21 LAB — GLUCOSE, CAPILLARY
Glucose-Capillary: 107 mg/dL — ABNORMAL HIGH (ref 70–99)
Glucose-Capillary: 94 mg/dL (ref 70–99)

## 2012-10-21 NOTE — Discharge Summary (Signed)
Physician Discharge Summary  Nichole Garza [id number]:[id number] DOB: 03/25/1961 DOA: 10/19/2012  PCP: Jackie Plum, MD  Admit date: 10/19/2012 Discharge date: 10/21/2012  Time spent: 35 minutes  Recommendations for Outpatient Follow-up:  1. Follow up with PCP in 2 weeks. 2. Recheck Bp and titrate medications as tolerate it.  Discharge Diagnoses:  Principal Problem:   Headache(784.0) with left-sided paresthesias to the cheek Active Problems:   Diabetes mellitus   Hypertensive urgency   Neuropathy   Fibromyalgia   Discharge Condition: stable  Diet recommendation: regular  Filed Weights   10/19/12 1400 10/20/12 0400  Weight: 49.1 kg (108 lb 3.9 oz) 50.5 kg (111 lb 5.3 oz)    History of present illness:  52 y.o. female with a PMH of DM and HTN who awoke this morning with a headache and left sided facial numbness. Had a similar episode 13 years ago and was diagnosed with a TIA at that time. Headache is frontal and left sided, rated a 6/10. There is associated photophobia. Has had a total of 8 mg of morphine with limited relief of pain. Has not missed any of her usual medications. Although she has a history of migraine headaches, her current headache is different than her usual migraine pain.   Hospital Course:  Headache(784.0) with left-sided paresthesias to the cheek probably due to complicated migranes: - Admitted for observation.  -  Full neurological evaluation with MRI/MRA of the brain (negative), two-dimensional echocardiogram (normal EF, no source of embolism), and carotid Dopplers (39% blockage of the right internal carotid artery).  - Fasting lipid panel showed total cholesterol 182, triglycerides 187, HDL 66, and LDL 79. She is on appropriate lipid-lowering therapies.  - Hemoglobin A1c  6.6%.  - Continue home medications. - pain resolved with current home meds.  Diabetes mellitus  -Continue metformin.   Hypertensive urgency  -Continue home  medications. -Propranolol increased to 80 mg twice a day and Cozaar increased to 100 mg daily.  Neuropathy / Fibromyalgia  -Continue Cymbalta and Neurontin.     Procedures:  MRI/MRA  Carotid oppler   Consultations:  none  Discharge Exam: Filed Vitals:   10/20/12 2119 10/21/12 0206 10/21/12 0539 10/21/12 1101  BP: 108/77 142/76 140/69 153/84  Pulse: 68 65 58 68  Temp: 98.3 F (36.8 C) 98.4 F (36.9 C) 98.2 F (36.8 C)   TempSrc: Oral Oral Oral   Resp: 16 16 15    Height:      Weight:      SpO2: 100% 100% 99%     General: A&O x3 Cardiovascular: RRR Respiratory: good air movement CTA B/L  Discharge Instructions  Discharge Orders   Future Orders Complete By Expires     Diet - low sodium heart healthy  As directed     Increase activity slowly  As directed         Medication List         aspirin EC 81 MG tablet  Take 81 mg by mouth every morning.     DULoxetine 30 MG capsule  Commonly known as:  CYMBALTA  Take 30 mg by mouth daily.     fish oil-omega-3 fatty acids 1000 MG capsule  Take 1 g by mouth 3 (three) times daily.     gabapentin 800 MG tablet  Commonly known as:  NEURONTIN  Take 800 mg by mouth 3 (three) times daily.     GINKGO BILOBA COMPLEX PO  Take 1 capsule by mouth daily.     LIPOIC ACID PO  Take 1 tablet by mouth daily.     losartan 50 MG tablet  Commonly known as:  COZAAR  Take 50 mg by mouth daily.     metFORMIN 500 MG tablet  Commonly known as:  GLUCOPHAGE  Take 500 mg by mouth 2 (two) times daily with a meal.     mexiletine 150 MG capsule  Commonly known as:  MEXITIL  Take 150 mg by mouth 3 (three) times daily.     multivitamin with minerals Tabs  Take 1 tablet by mouth daily.     pravastatin 10 MG tablet  Commonly known as:  PRAVACHOL  Take 10 mg by mouth at bedtime.     propranolol 60 MG tablet  Commonly known as:  INDERAL  Take 60 mg by mouth 2 (two) times daily.     traMADol 50 MG tablet  Commonly known as:   ULTRAM  Take 50 mg by mouth every 6 (six) hours as needed. For pain     vitamin C 500 MG tablet  Commonly known as:  ASCORBIC ACID  Take 500 mg by mouth daily.       Allergies  Allergen Reactions  . Enalapril Anaphylaxis  . Relpax (Eletriptan) Other (See Comments)    "fainting"       Follow-up Information   Follow up with OSEI-BONSU,GEORGE, MD In 2 weeks. (folow up with PCP)    Contact information:   [address], [address] [place] [address] [phone]        The results of significant diagnostics from this hospitalization (including imaging, microbiology, ancillary and laboratory) are listed below for reference.    Significant Diagnostic Studies: Ct Head Wo Contrast  10/19/2012   *RADIOLOGY REPORT*  Clinical Data: Headache.  Hypertension.  CT HEAD WITHOUT CONTRAST  Technique:  Contiguous axial images were obtained from the base of the skull through the vertex without contrast.  Comparison: 04/26/2010.  Findings: The ventricles are normal.  No extra-axial fluid collections are seen.  The brainstem and cerebellum are unremarkable.  No acute intracranial findings such as infarction or hemorrhage.  No mass lesions.  The bony calvarium is intact.  The visualized paranasal sinuses and mastoid air cells are clear.  IMPRESSION: No acute intracranial findings or mass lesion.   Original Report Authenticated By: Rudie Meyer, M.D.   Mr [facility] Wo Contrast  10/19/2012   *RADIOLOGY REPORT*  Clinical Data:  Left-sided facial numbness.  Headache.  Blurred vision.  MRI HEAD WITHOUT CONTRAST MRA HEAD WITHOUT CONTRAST  Technique:  Multiplanar, multiecho pulse sequences of the brain and surrounding structures were obtained without intravenous contrast. Angiographic images of the head were obtained using MRA technique without contrast.  Comparison:  Head CT same day  MRI HEAD  Findings:  The brain has a normal appearance without evidence of old or acute infarction, mass lesion, hemorrhage,  hydrocephalus or extra-axial collection.  No pituitary mass.  No inflammatory sinus disease.  No skull or skull base lesion.  IMPRESSION: Normal MRI of the brain  MRA HEAD  Findings: Both internal carotid arteries are widely patent into the brain.  The anterior and middle cerebral vessels are patent without proximal stenosis, aneurysm or vascular malformation.  Both vertebral arteries are patent to the basilar.  No basilar stenosis. Posterior circulation branch vessels are patent and unremarkable.  IMPRESSION: Normal intracranial MR angiography of the large and medium-sized vessels.   Original Report Authenticated By: Paulina Fusi, M.D.   Mri Brain Without Contrast  10/19/2012   *RADIOLOGY REPORT*  Clinical Data:  Left-sided facial numbness.  Headache.  Blurred vision.  MRI HEAD WITHOUT CONTRAST MRA HEAD WITHOUT CONTRAST  Technique:  Multiplanar, multiecho pulse sequences of the brain and surrounding structures were obtained without intravenous contrast. Angiographic images of the head were obtained using MRA technique without contrast.  Comparison:  Head CT same day  MRI HEAD  Findings:  The brain has a normal appearance without evidence of old or acute infarction, mass lesion, hemorrhage, hydrocephalus or extra-axial collection.  No pituitary mass.  No inflammatory sinus disease.  No skull or skull base lesion.  IMPRESSION: Normal MRI of the brain  MRA HEAD  Findings: Both internal carotid arteries are widely patent into the brain.  The anterior and middle cerebral vessels are patent without proximal stenosis, aneurysm or vascular malformation.  Both vertebral arteries are patent to the basilar.  No basilar stenosis. Posterior circulation branch vessels are patent and unremarkable.  IMPRESSION: Normal intracranial MR angiography of the large and medium-sized vessels.   Original Report Authenticated By: Paulina Fusi, M.D.    Microbiology: Recent Results (from the past 240 hour(s))  MRSA PCR SCREENING      Status: None   Collection Time    10/19/12  2:00 PM      Result Value Range Status   MRSA by PCR NEGATIVE  NEGATIVE Final   Comment:            The GeneXpert MRSA Assay (FDA     approved for NASAL specimens     only), is one component of a     comprehensive MRSA colonization     surveillance program. It is not     intended to diagnose MRSA     infection nor to guide or     monitor treatment for     MRSA infections.     Labs: Basic Metabolic Panel:  Recent Labs Lab 10/19/12 1100 10/19/12 1427  NA 138  --   K 3.9  --   CL 100  --   CO2 29  --   GLUCOSE 125*  --   BUN 14  --   CREATININE 0.90 0.83  CALCIUM 9.8  --    Liver Function Tests:  Recent Labs Lab 10/19/12 1100  AST 24  ALT 23  ALKPHOS 113  BILITOT <0.1*  PROT 7.4  ALBUMIN 3.6   No results found for this basename: LIPASE, AMYLASE,  in the last 168 hours No results found for this basename: AMMONIA,  in the last 168 hours CBC:  Recent Labs Lab 10/19/12 1025 10/19/12 1427  WBC 5.1 5.8  NEUTROABS 2.6  --   HGB 13.4 12.4  HCT 40.7 37.8  MCV 85.9 85.5  PLT 219 238   Cardiac Enzymes:  Recent Labs Lab 10/19/12 1100  TROPONINI <0.30   BNP: BNP (last 3 results) No results found for this basename: PROBNP,  in the last 8760 hours CBG:  Recent Labs Lab 10/20/12 1133 10/20/12 1739 10/20/12 2121 10/21/12 0739 10/21/12 1153  GLUCAP 82 120* 129* 107* 94       Signed:  FELIZ ORTIZ, Kyndal Heringer  [facility] 10/21/2012, 11:58 AM

## 2013-01-05 ENCOUNTER — Ambulatory Visit: Payer: 59 | Attending: Internal Medicine

## 2013-01-05 DIAGNOSIS — IMO0001 Reserved for inherently not codable concepts without codable children: Secondary | ICD-10-CM | POA: Insufficient documentation

## 2013-01-05 DIAGNOSIS — M542 Cervicalgia: Secondary | ICD-10-CM | POA: Insufficient documentation

## 2013-01-05 DIAGNOSIS — M25519 Pain in unspecified shoulder: Secondary | ICD-10-CM | POA: Insufficient documentation

## 2013-08-20 ENCOUNTER — Encounter (HOSPITAL_COMMUNITY): Payer: Self-pay | Admitting: Emergency Medicine

## 2013-08-20 ENCOUNTER — Emergency Department (HOSPITAL_COMMUNITY)
Admission: EM | Admit: 2013-08-20 | Discharge: 2013-08-20 | Disposition: A | Discharge status: Home / Self Care | Payer: No Typology Code available for payment source | Attending: Emergency Medicine | Admitting: Emergency Medicine

## 2013-08-20 ENCOUNTER — Emergency Department (HOSPITAL_COMMUNITY): Payer: No Typology Code available for payment source

## 2013-08-20 DIAGNOSIS — E119 Type 2 diabetes mellitus without complications: Secondary | ICD-10-CM | POA: Insufficient documentation

## 2013-08-20 DIAGNOSIS — S060X9A Concussion with loss of consciousness of unspecified duration, initial encounter: Secondary | ICD-10-CM

## 2013-08-20 DIAGNOSIS — I10 Essential (primary) hypertension: Secondary | ICD-10-CM | POA: Diagnosis not present

## 2013-08-20 DIAGNOSIS — S060X0A Concussion without loss of consciousness, initial encounter: Secondary | ICD-10-CM | POA: Insufficient documentation

## 2013-08-20 DIAGNOSIS — Z79899 Other long term (current) drug therapy: Secondary | ICD-10-CM | POA: Diagnosis not present

## 2013-08-20 DIAGNOSIS — Z7982 Long term (current) use of aspirin: Secondary | ICD-10-CM | POA: Insufficient documentation

## 2013-08-20 DIAGNOSIS — S060XAA Concussion with loss of consciousness status unknown, initial encounter: Secondary | ICD-10-CM

## 2013-08-20 DIAGNOSIS — S0990XA Unspecified injury of head, initial encounter: Secondary | ICD-10-CM | POA: Diagnosis present

## 2013-08-20 DIAGNOSIS — Y9389 Activity, other specified: Secondary | ICD-10-CM | POA: Diagnosis not present

## 2013-08-20 DIAGNOSIS — Z8673 Personal history of transient ischemic attack (TIA), and cerebral infarction without residual deficits: Secondary | ICD-10-CM | POA: Diagnosis not present

## 2013-08-20 DIAGNOSIS — IMO0001 Reserved for inherently not codable concepts without codable children: Secondary | ICD-10-CM | POA: Diagnosis not present

## 2013-08-20 MED ORDER — HYDROCODONE-ACETAMINOPHEN 5-325 MG PO TABS: 1.0000 | ORAL_TABLET | Freq: Four times a day (QID) | ORAL | Status: DC | PRN
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
## 2013-08-20 NOTE — ED Notes (Signed)
Pt ambulates in hallway with tech, ALP notified.

## 2013-08-20 NOTE — ED Notes (Signed)
ALP bedside 

## 2013-08-20 NOTE — ED Notes (Signed)
MD bedside

## 2013-08-20 NOTE — ED Notes (Signed)
Pt ambulates to xray with Tech

## 2013-08-20 NOTE — ED Notes (Signed)
Attempt to walk patient w/o success, pt answers questions when asked, then drifts back to sleep. Pt request that we contact staffing office, instructed pt she would have to call them herself. ALP made aware.
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
## 2013-08-20 NOTE — ED Notes (Signed)
Patient transported to CT 

## 2013-08-20 NOTE — ED Notes (Signed)
MD at bedside. 

## 2013-08-24 NOTE — ED Provider Notes (Signed)
Medical screening examination/treatment/procedure(s) were performed by non-physician practitioner and as supervising physician I was immediately available for consultation/collaboration.   EKG Interpretation None        Ephraim Hamburger, MD 08/24/13 [phone]

## 2013-08-25 ENCOUNTER — Other Ambulatory Visit (HOSPITAL_COMMUNITY): Payer: Self-pay | Admitting: Internal Medicine

## 2013-08-25 DIAGNOSIS — G43909 Migraine, unspecified, not intractable, without status migrainosus: Secondary | ICD-10-CM

## 2013-09-03 ENCOUNTER — Ambulatory Visit (HOSPITAL_COMMUNITY)
Admission: RE | Admit: 2013-09-03 | Discharge: 2013-09-03 | Disposition: A | Discharge status: Home / Self Care | Payer: 59 | Source: Ambulatory Visit | Attending: Internal Medicine | Admitting: Internal Medicine

## 2013-09-03 DIAGNOSIS — G43909 Migraine, unspecified, not intractable, without status migrainosus: Secondary | ICD-10-CM | POA: Insufficient documentation

## 2013-09-03 LAB — CREATININE, SERUM
Creatinine, Ser: 0.82 mg/dL (ref 0.50–1.10)
GFR, EST NON AFRICAN AMERICAN: 80 mL/min — AB (ref >90)

## 2013-09-03 MED ORDER — GADOBENATE DIMEGLUMINE 529 MG/ML IV SOLN
10.0000 mL | Freq: Once | INTRAVENOUS | Status: AC | PRN
  Administered 2013-09-03: 10 mL via INTRAVENOUS

## 2013-09-06 ENCOUNTER — Ambulatory Visit (HOSPITAL_COMMUNITY): Admission: RE | Admit: 2013-09-06 | Payer: 59 | Source: Ambulatory Visit

## 2013-10-12 ENCOUNTER — Other Ambulatory Visit (HOSPITAL_COMMUNITY): Payer: Self-pay | Admitting: Internal Medicine

## 2013-10-14 ENCOUNTER — Other Ambulatory Visit (HOSPITAL_COMMUNITY): Payer: Self-pay | Admitting: Internal Medicine

## 2013-10-14 DIAGNOSIS — G43909 Migraine, unspecified, not intractable, without status migrainosus: Secondary | ICD-10-CM

## 2013-11-03 ENCOUNTER — Ambulatory Visit (HOSPITAL_COMMUNITY)
Admission: RE | Admit: 2013-11-03 | Discharge: 2013-11-03 | Disposition: A | Discharge status: Home / Self Care | Payer: 59 | Source: Ambulatory Visit | Attending: Internal Medicine | Admitting: Internal Medicine

## 2013-11-03 DIAGNOSIS — G43909 Migraine, unspecified, not intractable, without status migrainosus: Secondary | ICD-10-CM

## 2013-11-03 DIAGNOSIS — M47812 Spondylosis without myelopathy or radiculopathy, cervical region: Secondary | ICD-10-CM | POA: Insufficient documentation

## 2014-02-13 ENCOUNTER — Emergency Department (HOSPITAL_COMMUNITY): Payer: 59

## 2014-02-13 ENCOUNTER — Emergency Department (HOSPITAL_COMMUNITY)
Admission: EM | Admit: 2014-02-13 | Discharge: 2014-02-13 | Disposition: A | Discharge status: Nursing Home (Includes ICF) | Payer: 59 | Source: Home / Self Care | Attending: Emergency Medicine | Admitting: Emergency Medicine

## 2014-02-13 ENCOUNTER — Encounter (HOSPITAL_COMMUNITY): Payer: Self-pay | Admitting: *Deleted

## 2014-02-13 ENCOUNTER — Encounter (HOSPITAL_COMMUNITY): Payer: Self-pay

## 2014-02-13 ENCOUNTER — Observation Stay (HOSPITAL_COMMUNITY)
Admission: EM | Admit: 2014-02-13 | Discharge: 2014-02-17 | Disposition: A | Discharge status: Home / Self Care | Payer: 59 | Attending: Internal Medicine | Admitting: Internal Medicine

## 2014-02-13 DIAGNOSIS — E1140 Type 2 diabetes mellitus with diabetic neuropathy, unspecified: Secondary | ICD-10-CM | POA: Diagnosis not present

## 2014-02-13 DIAGNOSIS — R55 Syncope and collapse: Secondary | ICD-10-CM | POA: Diagnosis not present

## 2014-02-13 DIAGNOSIS — J029 Acute pharyngitis, unspecified: Secondary | ICD-10-CM

## 2014-02-13 DIAGNOSIS — Z79899 Other long term (current) drug therapy: Secondary | ICD-10-CM | POA: Diagnosis not present

## 2014-02-13 DIAGNOSIS — D649 Anemia, unspecified: Secondary | ICD-10-CM | POA: Diagnosis not present

## 2014-02-13 DIAGNOSIS — R5081 Fever presenting with conditions classified elsewhere: Secondary | ICD-10-CM

## 2014-02-13 DIAGNOSIS — G9340 Encephalopathy, unspecified: Secondary | ICD-10-CM | POA: Insufficient documentation

## 2014-02-13 DIAGNOSIS — R0789 Other chest pain: Secondary | ICD-10-CM

## 2014-02-13 DIAGNOSIS — R109 Unspecified abdominal pain: Secondary | ICD-10-CM

## 2014-02-13 DIAGNOSIS — Z888 Allergy status to other drugs, medicaments and biological substances status: Secondary | ICD-10-CM | POA: Diagnosis not present

## 2014-02-13 DIAGNOSIS — Z91040 Latex allergy status: Secondary | ICD-10-CM | POA: Diagnosis not present

## 2014-02-13 DIAGNOSIS — K219 Gastro-esophageal reflux disease without esophagitis: Secondary | ICD-10-CM

## 2014-02-13 DIAGNOSIS — E119 Type 2 diabetes mellitus without complications: Secondary | ICD-10-CM

## 2014-02-13 DIAGNOSIS — E1165 Type 2 diabetes mellitus with hyperglycemia: Secondary | ICD-10-CM

## 2014-02-13 DIAGNOSIS — M4690 Unspecified inflammatory spondylopathy, site unspecified: Secondary | ICD-10-CM | POA: Insufficient documentation

## 2014-02-13 DIAGNOSIS — Z87442 Personal history of urinary calculi: Secondary | ICD-10-CM | POA: Insufficient documentation

## 2014-02-13 DIAGNOSIS — R531 Weakness: Secondary | ICD-10-CM

## 2014-02-13 DIAGNOSIS — R4182 Altered mental status, unspecified: Secondary | ICD-10-CM | POA: Diagnosis present

## 2014-02-13 DIAGNOSIS — Z7982 Long term (current) use of aspirin: Secondary | ICD-10-CM | POA: Insufficient documentation

## 2014-02-13 DIAGNOSIS — R404 Transient alteration of awareness: Secondary | ICD-10-CM | POA: Insufficient documentation

## 2014-02-13 DIAGNOSIS — I10 Essential (primary) hypertension: Secondary | ICD-10-CM | POA: Diagnosis not present

## 2014-02-13 DIAGNOSIS — R079 Chest pain, unspecified: Secondary | ICD-10-CM | POA: Diagnosis not present

## 2014-02-13 DIAGNOSIS — M797 Fibromyalgia: Secondary | ICD-10-CM | POA: Insufficient documentation

## 2014-02-13 HISTORY — DX: Migraine, unspecified, not intractable, without status migrainosus: G43.909

## 2014-02-13 HISTORY — DX: Unspecified osteoarthritis, unspecified site: M19.90

## 2014-02-13 HISTORY — DX: Disorder of kidney and ureter, unspecified: N28.9

## 2014-02-13 LAB — I-STAT ARTERIAL BLOOD GAS, ED
ACID-BASE EXCESS: 1 mmol/L (ref 0.0–2.0)
BICARBONATE: 27.1 meq/L — AB (ref 20.0–24.0)
O2 Saturation: 97 %
PCO2 ART: 47.6 mmHg — AB (ref 35.0–45.0)
Patient temperature: 98.6
TCO2: 29 mmol/L (ref 0–100)
pH, Arterial: 7.364 (ref 7.350–7.450)
pO2, Arterial: 93 mmHg (ref 80.0–100.0)

## 2014-02-13 LAB — URINALYSIS, ROUTINE W REFLEX MICROSCOPIC
Glucose, UA: >1000 mg/dL — AB
HGB URINE DIPSTICK: NEGATIVE
KETONES UR: NEGATIVE mg/dL
Leukocytes, UA: NEGATIVE
Nitrite: NEGATIVE
PROTEIN: NEGATIVE mg/dL
Specific Gravity, Urine: 1.021 (ref 1.005–1.030)
UROBILINOGEN UA: 0.2 mg/dL (ref 0.0–1.0)
pH: 5.5 (ref 5.0–8.0)

## 2014-02-13 LAB — CBC WITH DIFFERENTIAL/PLATELET
BASOS PCT: 1 % (ref 0–1)
Basophils Absolute: 0.1 10*3/uL (ref 0.0–0.1)
Eosinophils Absolute: 0.3 10*3/uL (ref 0.0–0.7)
Eosinophils Relative: 4 % (ref 0–5)
HEMATOCRIT: 37.3 % (ref 36.0–46.0)
Hemoglobin: 12 g/dL (ref 12.0–15.0)
Lymphocytes Relative: 21 % (ref 12–46)
Lymphs Abs: 1.6 10*3/uL (ref 0.7–4.0)
MCH: 28.7 pg (ref 26.0–34.0)
MCHC: 32.2 g/dL (ref 30.0–36.0)
MCV: 89.2 fL (ref 78.0–100.0)
MONO ABS: 0.4 10*3/uL (ref 0.1–1.0)
MONOS PCT: 6 % (ref 3–12)
Neutro Abs: 5.2 10*3/uL (ref 1.7–7.7)
Neutrophils Relative %: 68 % (ref 43–77)
Platelets: 259 10*3/uL (ref 150–400)
RBC: 4.18 MIL/uL (ref 3.87–5.11)
RDW: 13 % (ref 11.5–15.5)
WBC: 7.5 10*3/uL (ref 4.0–10.5)

## 2014-02-13 LAB — COMPREHENSIVE METABOLIC PANEL
ALT: 24 U/L (ref 0–35)
AST: 23 U/L (ref 0–37)
Albumin: 2.9 g/dL — ABNORMAL LOW (ref 3.5–5.2)
Alkaline Phosphatase: 124 U/L — ABNORMAL HIGH (ref 39–117)
Anion gap: 12 (ref 5–15)
BILIRUBIN TOTAL: 0.3 mg/dL (ref 0.3–1.2)
BUN: 12 mg/dL (ref 6–23)
CALCIUM: 8.9 mg/dL (ref 8.4–10.5)
CO2: 26 meq/L (ref 19–32)
CREATININE: 0.77 mg/dL (ref 0.50–1.10)
Chloride: 97 mEq/L (ref 96–112)
GLUCOSE: 247 mg/dL — AB (ref 70–99)
Potassium: 4.5 mEq/L (ref 3.7–5.3)
Sodium: 135 mEq/L — ABNORMAL LOW (ref 137–147)
Total Protein: 6.8 g/dL (ref 6.0–8.3)

## 2014-02-13 LAB — CBG MONITORING, ED: Glucose-Capillary: 248 mg/dL — ABNORMAL HIGH (ref 70–99)

## 2014-02-13 LAB — URINE MICROSCOPIC-ADD ON

## 2014-02-13 LAB — I-STAT TROPONIN, ED: Troponin i, poc: 0.02 ng/mL (ref 0.00–0.08)

## 2014-02-13 LAB — GLUCOSE, CAPILLARY: Glucose-Capillary: 146 mg/dL — ABNORMAL HIGH (ref 70–99)

## 2014-02-13 LAB — RAPID URINE DRUG SCREEN, HOSP PERFORMED
Amphetamines: NOT DETECTED
BARBITURATES: NOT DETECTED
Benzodiazepines: NOT DETECTED
COCAINE: NOT DETECTED
OPIATES: NOT DETECTED
Tetrahydrocannabinol: NOT DETECTED

## 2014-02-13 LAB — TSH: TSH: 0.694 u[IU]/mL (ref 0.350–4.500)

## 2014-02-13 LAB — RAPID STREP SCREEN (MED CTR MEBANE ONLY): Streptococcus, Group A Screen (Direct): NEGATIVE

## 2014-02-13 MED ORDER — NALOXONE HCL 0.4 MG/ML IJ SOLN
0.4000 mg | Freq: Once | INTRAMUSCULAR | Status: AC
  Administered 2014-02-13: 0.4 mg via INTRAVENOUS
  Filled 2014-02-13: qty 1

## 2014-02-13 MED ORDER — SODIUM CHLORIDE 0.9 % IJ SOLN: 3.0000 mL | INTRAMUSCULAR | Status: DC | PRN

## 2014-02-13 MED ORDER — ONDANSETRON HCL 4 MG PO TABS: 4.0000 mg | ORAL_TABLET | Freq: Four times a day (QID) | ORAL | Status: DC | PRN

## 2014-02-13 MED ORDER — ADULT MULTIVITAMIN W/MINERALS CH
1.0000 | ORAL_TABLET | Freq: Every day | ORAL | Status: DC
  Administered 2014-02-14 – 2014-02-17 (×4): 1 via ORAL
  Filled 2014-02-13 (×4): qty 1

## 2014-02-13 MED ORDER — GABAPENTIN 800 MG PO TABS
800.0000 mg | ORAL_TABLET | Freq: Three times a day (TID) | ORAL | Status: DC
  Filled 2014-02-13: qty 1

## 2014-02-13 MED ORDER — INSULIN ASPART 100 UNIT/ML ~~LOC~~ SOLN
0.0000 [IU] | Freq: Every day | SUBCUTANEOUS | Status: DC
  Administered 2014-02-15: 4 [IU] via SUBCUTANEOUS

## 2014-02-13 MED ORDER — SODIUM CHLORIDE 0.9 % IV SOLN
Freq: Once | INTRAVENOUS | Status: AC
  Administered 2014-02-13: 12:00:00 via INTRAVENOUS

## 2014-02-13 MED ORDER — ACETAMINOPHEN 650 MG RE SUPP: 650.0000 mg | Freq: Four times a day (QID) | RECTAL | Status: DC | PRN

## 2014-02-13 MED ORDER — GABAPENTIN 400 MG PO CAPS
800.0000 mg | ORAL_CAPSULE | Freq: Three times a day (TID) | ORAL | Status: DC
  Administered 2014-02-13 – 2014-02-17 (×10): 800 mg via ORAL
  Filled 2014-02-13 (×13): qty 2

## 2014-02-13 MED ORDER — ENOXAPARIN SODIUM 40 MG/0.4ML ~~LOC~~ SOLN
40.0000 mg | SUBCUTANEOUS | Status: DC
  Administered 2014-02-13 – 2014-02-16 (×4): 40 mg via SUBCUTANEOUS
  Filled 2014-02-13 (×5): qty 0.4

## 2014-02-13 MED ORDER — SODIUM CHLORIDE 0.9 % IJ SOLN
3.0000 mL | Freq: Two times a day (BID) | INTRAMUSCULAR | Status: DC
  Administered 2014-02-14 – 2014-02-17 (×6): 3 mL via INTRAVENOUS

## 2014-02-13 MED ORDER — DULOXETINE HCL 30 MG PO CPEP
30.0000 mg | ORAL_CAPSULE | Freq: Every day | ORAL | Status: DC
  Administered 2014-02-14 – 2014-02-17 (×4): 30 mg via ORAL
  Filled 2014-02-13 (×4): qty 1

## 2014-02-13 MED ORDER — PRAVASTATIN SODIUM 10 MG PO TABS
10.0000 mg | ORAL_TABLET | Freq: Every day | ORAL | Status: DC
  Administered 2014-02-13 – 2014-02-16 (×4): 10 mg via ORAL
  Filled 2014-02-13 (×5): qty 1

## 2014-02-13 MED ORDER — GI COCKTAIL ~~LOC~~
ORAL | Status: AC
  Filled 2014-02-13: qty 30

## 2014-02-13 MED ORDER — SODIUM CHLORIDE 0.9 % IJ SOLN
3.0000 mL | Freq: Two times a day (BID) | INTRAMUSCULAR | Status: DC
  Administered 2014-02-14 – 2014-02-15 (×2): 3 mL via INTRAVENOUS

## 2014-02-13 MED ORDER — ONDANSETRON HCL 4 MG/2ML IJ SOLN: 4.0000 mg | Freq: Four times a day (QID) | INTRAMUSCULAR | Status: DC | PRN

## 2014-02-13 MED ORDER — OXYCODONE HCL 5 MG PO TABS
5.0000 mg | ORAL_TABLET | Freq: Once | ORAL | Status: AC
  Administered 2014-02-13: 5 mg via ORAL
  Filled 2014-02-13: qty 1

## 2014-02-13 MED ORDER — MEXILETINE HCL 150 MG PO CAPS
150.0000 mg | ORAL_CAPSULE | Freq: Three times a day (TID) | ORAL | Status: DC
  Administered 2014-02-13 – 2014-02-17 (×9): 150 mg via ORAL
  Filled 2014-02-13 (×14): qty 1

## 2014-02-13 MED ORDER — ASPIRIN EC 81 MG PO TBEC
81.0000 mg | DELAYED_RELEASE_TABLET | Freq: Every morning | ORAL | Status: DC
  Administered 2014-02-14 – 2014-02-17 (×4): 81 mg via ORAL
  Filled 2014-02-13 (×4): qty 1

## 2014-02-13 MED ORDER — PROPRANOLOL HCL 60 MG PO TABS
60.0000 mg | ORAL_TABLET | Freq: Two times a day (BID) | ORAL | Status: DC
  Administered 2014-02-13 – 2014-02-17 (×7): 60 mg via ORAL
  Filled 2014-02-13 (×10): qty 1

## 2014-02-13 MED ORDER — CARBAMAZEPINE 100 MG PO CHEW
100.0000 mg | CHEWABLE_TABLET | Freq: Three times a day (TID) | ORAL | Status: DC
  Administered 2014-02-13 – 2014-02-17 (×10): 100 mg via ORAL
  Filled 2014-02-13 (×13): qty 1

## 2014-02-13 MED ORDER — SODIUM CHLORIDE 0.9 % IV SOLN: 250.0000 mL | INTRAVENOUS | Status: DC | PRN

## 2014-02-13 MED ORDER — TRAMADOL HCL 50 MG PO TABS
50.0000 mg | ORAL_TABLET | Freq: Four times a day (QID) | ORAL | Status: DC | PRN
  Administered 2014-02-13 – 2014-02-17 (×5): 50 mg via ORAL
  Filled 2014-02-13 (×5): qty 1

## 2014-02-13 MED ORDER — GI COCKTAIL ~~LOC~~
30.0000 mL | Freq: Once | ORAL | Status: AC
  Administered 2014-02-13: 30 mL via ORAL

## 2014-02-13 MED ORDER — INSULIN ASPART 100 UNIT/ML ~~LOC~~ SOLN
0.0000 [IU] | Freq: Three times a day (TID) | SUBCUTANEOUS | Status: DC
  Administered 2014-02-15: 3 [IU] via SUBCUTANEOUS
  Administered 2014-02-15: 2 [IU] via SUBCUTANEOUS
  Administered 2014-02-16: 3 [IU] via SUBCUTANEOUS
  Administered 2014-02-16 – 2014-02-17 (×3): 2 [IU] via SUBCUTANEOUS
  Administered 2014-02-17: 3 [IU] via SUBCUTANEOUS

## 2014-02-13 MED ORDER — ACETAMINOPHEN 325 MG PO TABS
650.0000 mg | ORAL_TABLET | Freq: Four times a day (QID) | ORAL | Status: DC | PRN
Start: 2014-02-13 — End: 2014-02-17
  Administered 2014-02-13: 650 mg via ORAL
  Filled 2014-02-13: qty 2

## 2014-02-13 MED ORDER — AMLODIPINE BESYLATE 5 MG PO TABS
5.0000 mg | ORAL_TABLET | Freq: Every day | ORAL | Status: DC
  Administered 2014-02-14 – 2014-02-17 (×4): 5 mg via ORAL
  Filled 2014-02-13 (×4): qty 1

## 2014-02-13 NOTE — ED Provider Notes (Signed)
CSN: [id number]     Arrival date & time 02/13/14  1250 History   First MD Initiated Contact with Patient 02/13/14 1253     Chief Complaint  Patient presents with  . Altered Mental Status     (Consider location/radiation/quality/duration/timing/severity/associated sxs/prior Treatment) Patient is a 53 y.o. female presenting with altered mental status. The history is provided by the patient. No language interpreter was used.  Altered Mental Status Presenting symptoms: partial responsiveness   Severity:  Moderate Most recent episode:  Today Episode history:  Single Duration:  1 hour Timing:  Constant Progression:  Worsening Chronicity:  New Context: not head injury, not a recent change in medication and not a recent illness   Associated symptoms: no abdominal pain, no fever, no headaches, no light-headedness, no nausea, no rash and no vomiting     Past Medical History  Diagnosis Date  . Diabetes mellitus   . Hypertension   . Nerve pain   . Fibromyalgia   . TIA (transient ischemic attack)   . Migraine headache   . Arthritis     in her spine  . Renal disorder     kidney stone   Past Surgical History  Procedure Laterality Date  . Cystoscopy with retrograde pyelogram, ureteroscopy and stent placement Right 05/29/2012    Procedure: [place], URETEROSCOPY AND STENT PLACEMENT;  Surgeon: Hanley Ben, MD;  Location: [facility];  Service: Urology;  Laterality: Right;  . Holmium laser application Right 7/51/0258    Procedure: HOLMIUM LASER APPLICATION;  Surgeon: Hanley Ben, MD;  Location: [facility];  Service: Urology;  Laterality: Right;  . Abdominal hysterectomy  2012   Family History  Problem Relation Age of Onset  . Diabetes Mother   . Diabetes Father   . Cancer Brother    History  Substance Use Topics  . Smoking status: Never Smoker   . Smokeless tobacco: Never Used  . Alcohol Use: No   OB History    No data available     Review of  Systems  Constitutional: Negative for fever.  HENT: Negative for congestion, rhinorrhea and sore throat.   Respiratory: Positive for chest tightness. Negative for cough and shortness of breath.   Cardiovascular: Negative for chest pain.  Gastrointestinal: Negative for nausea, vomiting, abdominal pain and diarrhea.  Genitourinary: Negative for dysuria and hematuria.  Skin: Negative for rash.  Neurological: Negative for syncope, light-headedness and headaches.  All other systems reviewed and are negative.     Allergies  Enalapril; Hydrocodone; Latex; and Relpax  Home Medications   Prior to Admission medications   Medication Sig Start Date End Date Taking? Authorizing Provider  Alpha-Lipoic Acid 100 MG CAPS Take 100 mg by mouth daily.    Historical Provider, MD  aspirin EC 81 MG tablet Take 81 mg by mouth every morning.    Historical Provider, MD  DULoxetine (CYMBALTA) 30 MG capsule Take 30 mg by mouth daily.    Historical Provider, MD  fish oil-omega-3 fatty acids 1000 MG capsule Take 1 g by mouth 3 (three) times daily.     Historical Provider, MD  gabapentin (NEURONTIN) 800 MG tablet Take 800 mg by mouth 3 (three) times daily.    Historical Provider, MD  Evelina Bucy BILOBA COMPLEX PO Take 1 capsule by mouth daily.     Historical Provider, MD  HYDROcodone-acetaminophen (NORCO/VICODIN) 5-325 MG per tablet Take 1-2 tablets by mouth every 6 (six) hours as needed. 08/20/13   Montine Circle, PA-C  losartan (COZAAR)  50 MG tablet Take 50 mg by mouth daily.    Historical Provider, MD  metFORMIN (GLUCOPHAGE) 500 MG tablet Take 500 mg by mouth 2 (two) times daily with a meal.    Historical Provider, MD  mexiletine (MEXITIL) 150 MG capsule Take 150 mg by mouth 3 (three) times daily.    Historical Provider, MD  Multiple Vitamin (MULTIVITAMIN WITH MINERALS) TABS Take 1 tablet by mouth daily.    Historical Provider, MD  pravastatin (PRAVACHOL) 10 MG tablet Take 10 mg by mouth at bedtime.    Historical  Provider, MD  propranolol (INDERAL) 60 MG tablet Take 60 mg by mouth 2 (two) times daily.    Historical Provider, MD  traMADol (ULTRAM) 50 MG tablet Take 50 mg by mouth every 6 (six) hours as needed. For pain    Historical Provider, MD  vitamin C (ASCORBIC ACID) 500 MG tablet Take 500 mg by mouth daily.    Historical Provider, MD   BP 104/62 mmHg  Pulse 53  Resp 14  SpO2 97% Physical Exam  Constitutional: She is oriented to person, place, and time. She appears well-developed and well-nourished.  HENT:  Head: Normocephalic and atraumatic.  Right Ear: External ear normal.  Left Ear: External ear normal.  Eyes: EOM are normal.  Neck: Normal range of motion. Neck supple.  Cardiovascular: Normal rate, regular rhythm and intact distal pulses.  Exam reveals no gallop and no friction rub.   No murmur heard. Pulmonary/Chest: Effort normal and breath sounds normal. No respiratory distress. She has no wheezes. She has no rales. She exhibits no tenderness.  Abdominal: Soft. Bowel sounds are normal. She exhibits no distension. There is no tenderness. There is no rebound.  Musculoskeletal: Normal range of motion. She exhibits no edema or tenderness.  Lymphadenopathy:    She has no cervical adenopathy.  Neurological: She is alert and oriented to person, place, and time.  Skin: Skin is warm. No rash noted.  Psychiatric: She has a normal mood and affect. Her behavior is normal.  Nursing note and vitals reviewed.   ED Course  Procedures (including critical care time) Labs Review Labs Reviewed  COMPREHENSIVE METABOLIC PANEL - Abnormal; Notable for the following:    Sodium 135 (*)    Glucose, Bld 247 (*)    Albumin 2.9 (*)    Alkaline Phosphatase 124 (*)    All other components within normal limits  URINALYSIS, ROUTINE W REFLEX MICROSCOPIC - Abnormal; Notable for the following:    Glucose, UA >1000 (*)    Bilirubin Urine LARGE (*)    All other components within normal limits  CBG MONITORING,  ED - Abnormal; Notable for the following:    Glucose-Capillary 248 (*)    All other components within normal limits  I-STAT ARTERIAL BLOOD GAS, ED - Abnormal; Notable for the following:    pCO2 arterial 47.6 (*)    Bicarbonate 27.1 (*)    All other components within normal limits  TSH  URINE RAPID DRUG SCREEN (HOSP PERFORMED)  URINE MICROSCOPIC-ADD ON  TROPONIN I  BLOOD GAS, ARTERIAL  CBC WITH DIFFERENTIAL    Imaging Review Ct Head Wo Contrast  02/13/2014   CLINICAL DATA:  53 year old female with altered mental status  EXAM: CT HEAD WITHOUT CONTRAST  TECHNIQUE: Contiguous axial images were obtained from the base of the skull through the vertex without intravenous contrast.  COMPARISON:  08/20/2013  FINDINGS: No intracranial abnormalities are identified, including mass lesion or mass effect, hydrocephalus, extra-axial fluid collection,  midline shift, hemorrhage, or acute infarction.  The visualized bony calvarium is unremarkable.  IMPRESSION: Unremarkable noncontrast head CT.   Electronically Signed   By: Hassan Rowan M.D.   On: 02/13/2014 15:22   Dg Chest Portable 1 View  02/13/2014   CLINICAL DATA:  53 year old female with altered mental status  EXAM: PORTABLE CHEST - 1 VIEW  COMPARISON:  None.  FINDINGS: Cardiac and mediastinal contours are within normal limits. Inspiratory volumes are low. No focal airspace consolidation, pleural effusion, pneumothorax or evidence of pulmonary edema. No acute osseous abnormality.  IMPRESSION: Low inspiratory volumes without evidence of acute cardiopulmonary process.   Electronically Signed   By: Jacqulynn Cadet M.D.   On: 02/13/2014 14:36     EKG Interpretation None      MDM   Final diagnoses:  Altered mental status    1:06 PM Pt is a 53 y.o. female with pertinent PMHX of DM, HTN, TIA, gibromyalgia who presents to the ED with altered mental status. Seen at urgent care for epigastric discomfort and chest discomfort radiating into neck. Got GI  cocktail with releif of symptoms however became more fatigued and more sleepy. Patient transferred from urgent care to ED for further evaluation. Patient on arrival sleepy, but following commands. Moving all extremities. No chest pain or shortness of breath currently. No fevers or recent illness. No nuasea, vomiting or diarrhea. Takes tramadol chronically for pain. denies taking more tramadol or any illicit drugs. No seizure like activity  On exam: sleepy, follows commands, moving all extremities. Non focal neurologic exam. . Will trial dose of narcan x2. Will obtain AMS work up: CT head wo contrast. portable CXr, CBC, CMP, TSH, ABG and EKG.  EKG personally reviewed by myself showed NSR, biatrial enlargement, nonspecific t wave abnormalities Rate of 67, PR 136m, QRS 749mQT/QTC 386/40752mnormal axis, without evidence of new ischemia. Comparison showed similar, indication: chest pain  Likely toxidrome, possible accidental ingestion. Awoke during ABG.  Review of labs: CMP: hyponatremia, no elevated LFTs, elevated alk phos TSH: 0.694 ABG: 7.346/47.6/93.0/27.1 UDS: negative UA: larg bili no evidence of UTI  CT head wo contrast no evidence of acute hemorrhage  3:49 PM per husband not at baseline. Grossly not at baseline. Plan to consult [facility] for admission for altered mental status not at baseline. Likely toxidrome. No evidence of acidosis or gap acidosis.   Plan for admission to hospitalist  Labs, EKG and imaging reviewed by myself and considered in medical decision making if ordered.  Imaging interpreted by radiology. Pt was discussed with my attending, Dr. RayLinna HoffD 02/13/14 [id number] 02/14/14 1049
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
## 2014-02-13 NOTE — ED Notes (Signed)
Pt. going to sleep, when not stimulated but is arousable. Pt. States only pain medication was 1 Tramadol this AM.  States her CBG was 92.  Ate some crackers this AM.  [facility] did a CBG and it was 326.

## 2014-02-13 NOTE — ED Notes (Signed)
C/o tightness in her lower chest and goes up to her neck Saw her doctor on Thurs and got a breathing treatment. She got a shot for severe pain and her BP was 168/104.  Pain has come back and her voice is leaving.

## 2014-02-13 NOTE — ED Notes (Signed)
[facility]- Pt coming from [facility], was seen there for chest tightness and abdominal pain. Pt became lethargic while at [facility]. Pt had some snoring respirations. Is responsive to painful stimuli. EMS also reports pinpoint pupils.

## 2014-02-13 NOTE — ED Provider Notes (Signed)
CSN: [id number]     Arrival date & time 02/13/14  1027 History   First MD Initiated Contact with Patient 02/13/14 1034     Chief Complaint  Patient presents with  . Chest Pain   (Consider location/radiation/quality/duration/timing/severity/associated sxs/prior Treatment) HPI She is a 53 year old woman here for evaluation of chest discomfort. She states it starts in her lower chest as a tightness/discomfort. It radiates up her chest into her throat. She also reports fluctuations in her voice. She denies any diaphoresis, nausea, shortness of breath. She denies any alleviating or exacerbating factors. She also reports feeling generally weak. She had a similar episode on Thursday, but this was associated with shortness of breath. She was seen by her primary care physician at that time and treated with the breathing treatment.  Past Medical History  Diagnosis Date  . Diabetes mellitus   . Hypertension   . Nerve pain   . Fibromyalgia   . TIA (transient ischemic attack)   . Migraine headache   . Arthritis     in her spine  . Renal disorder     kidney stone   Past Surgical History  Procedure Laterality Date  . Cystoscopy with retrograde pyelogram, ureteroscopy and stent placement Right 05/29/2012    Procedure: [place], URETEROSCOPY AND STENT PLACEMENT;  Surgeon: Hanley Ben, MD;  Location: [facility];  Service: Urology;  Laterality: Right;  . Holmium laser application Right 1/66/0630    Procedure: HOLMIUM LASER APPLICATION;  Surgeon: Hanley Ben, MD;  Location: [facility];  Service: Urology;  Laterality: Right;  . Abdominal hysterectomy  2012   Family History  Problem Relation Age of Onset  . Diabetes Mother   . Diabetes Father   . Cancer Brother    History  Substance Use Topics  . Smoking status: Never Smoker   . Smokeless tobacco: Never Used  . Alcohol Use: No   OB History    No data available     Review of Systems  Constitutional: Positive for  fatigue. Negative for fever and chills.  HENT: Negative.   Respiratory: Positive for chest tightness. Negative for cough and shortness of breath.   Cardiovascular: Positive for chest pain.  Gastrointestinal: Positive for abdominal pain. Negative for nausea, vomiting and diarrhea.  Neurological: Positive for weakness.    Allergies  Enalapril; Hydrocodone; Latex; and Relpax  Home Medications   Prior to Admission medications   Medication Sig Start Date End Date Taking? Authorizing Provider  Alpha-Lipoic Acid 100 MG CAPS Take 100 mg by mouth daily.   Yes Historical Provider, MD  aspirin EC 81 MG tablet Take 81 mg by mouth every morning.   Yes Historical Provider, MD  DULoxetine (CYMBALTA) 30 MG capsule Take 30 mg by mouth daily.   Yes Historical Provider, MD  fish oil-omega-3 fatty acids 1000 MG capsule Take 1 g by mouth 3 (three) times daily.    Yes Historical Provider, MD  gabapentin (NEURONTIN) 800 MG tablet Take 800 mg by mouth 3 (three) times daily.   Yes Historical Provider, MD  Evelina Bucy BILOBA COMPLEX PO Take 1 capsule by mouth daily.    Yes Historical Provider, MD  losartan (COZAAR) 50 MG tablet Take 50 mg by mouth daily.   Yes Historical Provider, MD  metFORMIN (GLUCOPHAGE) 500 MG tablet Take 500 mg by mouth 2 (two) times daily with a meal.   Yes Historical Provider, MD  mexiletine (MEXITIL) 150 MG capsule Take 150 mg by mouth 3 (three) times daily.  Yes Historical Provider, MD  Multiple Vitamin (MULTIVITAMIN WITH MINERALS) TABS Take 1 tablet by mouth daily.   Yes Historical Provider, MD  pravastatin (PRAVACHOL) 10 MG tablet Take 10 mg by mouth at bedtime.   Yes Historical Provider, MD  propranolol (INDERAL) 60 MG tablet Take 60 mg by mouth 2 (two) times daily.   Yes Historical Provider, MD  traMADol (ULTRAM) 50 MG tablet Take 50 mg by mouth every 6 (six) hours as needed. For pain   Yes Historical Provider, MD  vitamin C (ASCORBIC ACID) 500 MG tablet Take 500 mg by mouth daily.   Yes  Historical Provider, MD  HYDROcodone-acetaminophen (NORCO/VICODIN) 5-325 MG per tablet Take 1-2 tablets by mouth every 6 (six) hours as needed. 08/20/13   Montine Circle, PA-C   BP 114/73 mmHg  Pulse 83  Temp(Src) 97.7 F (36.5 C) (Oral)  Resp 16  SpO2 99% Physical Exam  Constitutional: She is oriented to person, place, and time. She appears well-developed and well-nourished.  Appears fatigued  HENT:  Head: Normocephalic and atraumatic.  Nose: Nose normal.  Mouth/Throat: Oropharynx is clear and moist. No oropharyngeal exudate.  Eyes: Conjunctivae are normal.  Neck: Neck supple.  Cardiovascular: Normal rate, regular rhythm and normal heart sounds.   No murmur heard. Pulmonary/Chest: Effort normal and breath sounds normal. No respiratory distress. She has no wheezes. She has no rales. She exhibits tenderness (right sternal border).  Abdominal: Soft. Bowel sounds are normal. She exhibits no distension. There is tenderness (epigastric and RLQ). There is no rebound and no guarding.  Lymphadenopathy:    She has no cervical adenopathy.  Neurological: She is alert and oriented to person, place, and time.  Skin: Skin is warm and dry.    ED Course  EKG  Date/Time: 02/13/2014 11:16 AM Performed by: Melony Overly Authorized by: Melony Overly Interpreted by ED physician Comparison: compared with previous ECG  Similar to previous ECG Rhythm: sinus rhythm Rate: normal QRS axis: normal Conduction: conduction normal ST Segments: ST segments normal T depression: V4 and V5 Clinical impression: non-specific ECG Comments: T-wave inversions in lateral leads, but these are present on prior EKG.   (including critical care time) Labs Review Labs Reviewed - No data to display  Imaging Review No results found.   MDM   1. Weakness generalized   2. Gastroesophageal reflux disease, esophagitis presence not specified    History is not concerning for cardiac etiology. EKG reviewed and  stable from prior. Lungs are clear to auscultation. No wheezing or history of asthma. I suspect this discomfort is coming from reflux. We'll treat with a GI cocktail here.  She reports resolution of her discomfort.  However, she is now appearing even more fatigued. She will not raise her head when speaking with me. She will slit her eyes open for brief moments. She responds to questions appropriately, but in a very soft and difficult to understand voice. Will transfer to [facility] via EMS for additional evaluation. Melony Overly, MD 02/13/14 [phone]

## 2014-02-13 NOTE — ED Notes (Signed)
EKG done by Alene Mires LPN.

## 2014-02-13 NOTE — ED Notes (Signed)
CBG is 248. Notified Nurse Jerene Pitch.

## 2014-02-13 NOTE — ED Notes (Signed)
[facility] called and will have a truck ASAP

## 2014-02-13 NOTE — Progress Notes (Signed)
MD notified due to acute change in patient status. Vital signs and labs are listed below.  Responding MD:  Rogue Bussing, NP Time MD responded: [id number] MD response: Respiratory panel for questionable influenza, will continue to monitor patient.  Vital Signs Filed Vitals:   02/13/14 1730 02/13/14 1800 02/13/14 2157 02/13/14 2341  BP: 129/88 162/87 168/82   Pulse: 70  94   Temp:   100.8 F (38.2 C) 103 F (39.4 C)  TempSrc:   Oral Oral  Resp: 18 19 18    SpO2: 100%  99%      Lab Results WBC  Date/Time Value Ref Range Status  02/13/2014 09:29 PM 7.5 4.0 - 10.5 K/uL Final  10/19/2012 02:27 PM 5.8 4.0 - 10.5 K/uL Final  10/19/2012 10:25 AM 5.1 4.0 - 10.5 K/uL Final   NEUTROPHILS RELATIVE %  Date/Time Value Ref Range Status  02/13/2014 09:29 PM 68 43 - 77 % Final  10/19/2012 10:25 AM 51 43 - 77 % Final  05/28/2012 05:20 PM 82* 43 - 77 % Final   PCO2 ARTERIAL  Date/Time Value Ref Range Status  02/13/2014 02:29 PM 47.6* 35.0 - 45.0 mmHg Final   No results found for: LATICACIDVEN No results found for: PCO2VEN   Parthenia Ames, RN 02/13/2014, 11:54 PM

## 2014-02-13 NOTE — H&P (Signed)
[facility] History and Physical  Nichole Garza IEP:[id number] DOB: 30-Jun-1960 DOA: 02/13/2014  Referring physician: er  PCP: Benito Mccreedy, MD   Chief Complaint: multiple c/o  HPI: Nichole Garza is a 53 y.o. female  Who was seen at the Urgent care earlier today with c/o chest tightness and throat pain.  She was diagnosed with GERD as her EKG was fine.  she was given a GI cocktail and it was reported that she became lethargic. She was then sent to the ER She also went to her PCP Thursday, they gave her a breathing treatment.   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  She is due back at work soon.    C/o weakness.  Initially she was lethargic when she arrived at the ER.  Denies taking too much of her home medications.  She was given narcan with minimal improvement but she is now awake.  She is c/o throat pain and weakness.  Denis fevers but admits to chills.   Labs unremarkable but troponin is still pending.  She is bradycardic during sleep.   Denies difficulty swallowing just pain, denies difficulty breathing   Review of Systems:  All systems reviewed, negative unless stated above    Past Medical History  Diagnosis Date  . Diabetes mellitus   . Hypertension   . Nerve pain   . Fibromyalgia   . TIA (transient ischemic attack)   . Migraine headache   . Arthritis     in her spine  . Renal disorder     kidney stone   Past Surgical History  Procedure Laterality Date  . Cystoscopy with retrograde pyelogram, ureteroscopy and stent placement Right 05/29/2012    Procedure: [place], URETEROSCOPY AND STENT PLACEMENT;  Surgeon: Hanley Ben, MD;  Location: [facility];  Service: Urology;  Laterality: Right;  . Holmium laser application Right 6/60/6301    Procedure: HOLMIUM LASER APPLICATION;  Surgeon: Hanley Ben, MD;  Location: [facility];  Service: Urology;   Laterality: Right;  . Abdominal hysterectomy  2012   Social History:  reports that she has never smoked. She has never used smokeless tobacco. She reports that she does not drink alcohol or use illicit drugs.  Allergies  Allergen Reactions  . Enalapril Anaphylaxis  . Hydrocodone     Does not remember what happened-she woke up in her kitchen  . Latex Swelling    Lips and face  . Relpax [Eletriptan] Other (See Comments)    "fainting"-knocked her out for the whole day    Family History  Problem Relation Age of Onset  . Diabetes Mother   . Diabetes Father   . Cancer Brother      Prior to Admission medications   Medication Sig Start Date End Date Taking? Authorizing Provider  Alpha-Lipoic Acid 100 MG CAPS Take 100 mg by mouth daily.   Yes Historical Provider, MD  amLODipine (NORVASC) 5 MG tablet Take 5 mg by mouth daily. 01/07/14  Yes Historical Provider, MD  aspirin EC 81 MG tablet Take 81 mg by mouth every morning.   Yes Historical Provider, MD  carbamazepine (TEGRETOL) 100 MG chewable tablet Chew 100 mg by mouth 3 (three) times daily. 12/27/13  Yes Historical Provider, MD  DULoxetine (CYMBALTA) 30 MG capsule Take 30 mg by mouth daily.   Yes Historical Provider, MD  fish oil-omega-3 fatty acids 1000 MG capsule Take 1 g by mouth  3 (three) times daily.    Yes Historical Provider, MD  gabapentin (NEURONTIN) 800 MG tablet Take 800 mg by mouth 3 (three) times daily.   Yes Historical Provider, MD  Evelina Bucy BILOBA COMPLEX PO Take 1 capsule by mouth daily.    Yes Historical Provider, MD  losartan (COZAAR) 50 MG tablet Take 50 mg by mouth daily.   Yes Historical Provider, MD  metFORMIN (GLUCOPHAGE) 500 MG tablet Take 500 mg by mouth 2 (two) times daily with a meal.   Yes Historical Provider, MD  mexiletine (MEXITIL) 150 MG capsule Take 150 mg by mouth 3 (three) times daily.   Yes Historical Provider, MD  Multiple Vitamin (MULTIVITAMIN WITH MINERALS) TABS Take 1 tablet by mouth daily.   Yes  Historical Provider, MD  pravastatin (PRAVACHOL) 10 MG tablet Take 10 mg by mouth at bedtime.   Yes Historical Provider, MD  propranolol (INDERAL) 60 MG tablet Take 60 mg by mouth 2 (two) times daily.   Yes Historical Provider, MD  traMADol (ULTRAM) 50 MG tablet Take 50 mg by mouth every 6 (six) hours as needed. For pain   Yes Historical Provider, MD  vitamin C (ASCORBIC ACID) 500 MG tablet Take 500 mg by mouth daily.   Yes Historical Provider, MD  HYDROcodone-acetaminophen (NORCO/VICODIN) 5-325 MG per tablet Take 1-2 tablets by mouth every 6 (six) hours as needed. Patient not taking: Reported on 02/13/2014 08/20/13   Montine Circle, PA-C   Physical Exam: Filed Vitals:   02/13/14 1530 02/13/14 1600 02/13/14 1615 02/13/14 1630  BP: 134/87 130/78 117/68 114/68  Pulse:  55 54 56  Temp:      TempSrc:      Resp: 15 18 20 22   SpO2:  98% 100% 100%    Wt Readings from Last 3 Encounters:  10/20/12 50.5 kg (111 lb 5.3 oz)  05/29/12 51.075 kg (112 lb 9.6 oz)    General:  Will not make eye contact Eyes: PERRL, normal lids, irises & conjunctiva ENT: could not visuable back of throat, patient kept gagging Neck: tender at lymph nodes Cardiovascular: RRR, no m/r/g. No LE edema. Respiratory: CTA bilaterally, no w/r/r. Normal respiratory effort. Abdomen: soft, ntnd Skin: no rash or induration seen on limited exam Musculoskeletal: grossly normal tone BUE/BLE Psychiatric: grossly normal mood and affect, speech fluent and appropriate Neurologic: grossly non-focal.          Labs on Admission:  Basic Metabolic Panel:  Recent Labs Lab 02/13/14 1334  NA 135*  K 4.5  CL 97  CO2 26  GLUCOSE 247*  BUN 12  CREATININE 0.77  CALCIUM 8.9   Liver Function Tests:  Recent Labs Lab 02/13/14 1334  AST 23  ALT 24  ALKPHOS 124*  BILITOT 0.3  PROT 6.8  ALBUMIN 2.9*   No results for input(s): LIPASE, AMYLASE in the last 168 hours. No results for input(s): AMMONIA in the last 168  hours. CBC: No results for input(s): WBC, NEUTROABS, HGB, HCT, MCV, PLT in the last 168 hours. Cardiac Enzymes: No results for input(s): CKTOTAL, CKMB, CKMBINDEX, TROPONINI in the last 168 hours.  BNP (last 3 results) No results for input(s): PROBNP in the last 8760 hours. CBG:  Recent Labs Lab 02/13/14 1320  GLUCAP 248*    Radiological Exams on Admission: Ct Head Wo Contrast  02/13/2014   CLINICAL DATA:  53 year old female with altered mental status  EXAM: CT HEAD WITHOUT CONTRAST  TECHNIQUE: Contiguous axial images were obtained from the base of the skull  through the vertex without intravenous contrast.  COMPARISON:  08/20/2013  FINDINGS: No intracranial abnormalities are identified, including mass lesion or mass effect, hydrocephalus, extra-axial fluid collection, midline shift, hemorrhage, or acute infarction.  The visualized bony calvarium is unremarkable.  IMPRESSION: Unremarkable noncontrast head CT.   Electronically Signed   By: Hassan Rowan M.D.   On: 02/13/2014 15:22   Dg Chest Portable 1 View  02/13/2014   CLINICAL DATA:  53 year old female with altered mental status  EXAM: PORTABLE CHEST - 1 VIEW  COMPARISON:  None.  FINDINGS: Cardiac and mediastinal contours are within normal limits. Inspiratory volumes are low. No focal airspace consolidation, pleural effusion, pneumothorax or evidence of pulmonary edema. No acute osseous abnormality.  IMPRESSION: Low inspiratory volumes without evidence of acute cardiopulmonary process.   Electronically Signed   By: Jacqulynn Cadet M.D.   On: 02/13/2014 14:36    EKG: Independently reviewed. Sinus brady  Assessment/Plan Active Problems:   Diabetes mellitus   Fibromyalgia   Altered mental status   Sore throat   Sore throat- ? GERD vs strep (no fever but + chills)- rapid test- hold on abx; patient is also on FMLA and is to return to work soon. Fibromyalgia Chest tightness- no risk factors for PE, EKG ok, await troponin AMS- resolved;  ? Etiology, CT head ok, ABG ok, UDS negative DM- SSI  obs overnight  Code Status: full DVT Prophylaxis: Family Communication: patient/husband Disposition Plan:   Time spent: 57 min  Eulogio Bear [facility] Pager [phone]

## 2014-02-13 NOTE — ED Notes (Signed)
Attempted report 

## 2014-02-14 ENCOUNTER — Observation Stay (HOSPITAL_COMMUNITY): Payer: 59

## 2014-02-14 DIAGNOSIS — R0789 Other chest pain: Secondary | ICD-10-CM

## 2014-02-14 DIAGNOSIS — R55 Syncope and collapse: Secondary | ICD-10-CM

## 2014-02-14 DIAGNOSIS — R5081 Fever presenting with conditions classified elsewhere: Secondary | ICD-10-CM

## 2014-02-14 DIAGNOSIS — E1149 Type 2 diabetes mellitus with other diabetic neurological complication: Secondary | ICD-10-CM

## 2014-02-14 DIAGNOSIS — R072 Precordial pain: Secondary | ICD-10-CM

## 2014-02-14 DIAGNOSIS — I10 Essential (primary) hypertension: Secondary | ICD-10-CM

## 2014-02-14 DIAGNOSIS — G9340 Encephalopathy, unspecified: Secondary | ICD-10-CM

## 2014-02-14 DIAGNOSIS — R410 Disorientation, unspecified: Secondary | ICD-10-CM | POA: Insufficient documentation

## 2014-02-14 DIAGNOSIS — R4182 Altered mental status, unspecified: Secondary | ICD-10-CM

## 2014-02-14 LAB — RESPIRATORY VIRUS PANEL
Adenovirus: NOT DETECTED
Influenza A H1: NOT DETECTED
Influenza A H3: NOT DETECTED
Influenza A: NOT DETECTED
Influenza B: NOT DETECTED
Metapneumovirus: NOT DETECTED
Parainfluenza 1: NOT DETECTED
Parainfluenza 2: NOT DETECTED
Parainfluenza 3: NOT DETECTED
Respiratory Syncytial Virus A: NOT DETECTED
Respiratory Syncytial Virus B: NOT DETECTED
Rhinovirus: NOT DETECTED

## 2014-02-14 LAB — TROPONIN I
Troponin I: <0.3 ng/mL (ref <0.30)
Troponin I: <0.3 ng/mL (ref <0.30)
Troponin I: <0.3 ng/mL (ref <0.30)

## 2014-02-14 LAB — BASIC METABOLIC PANEL
Anion gap: 16 — ABNORMAL HIGH (ref 5–15)
BUN: 8 mg/dL (ref 6–23)
CALCIUM: 9.1 mg/dL (ref 8.4–10.5)
CO2: 26 mEq/L (ref 19–32)
CREATININE: 0.86 mg/dL (ref 0.50–1.10)
Chloride: 99 mEq/L (ref 96–112)
GFR, EST AFRICAN AMERICAN: 87 mL/min — AB (ref >90)
GFR, EST NON AFRICAN AMERICAN: 75 mL/min — AB (ref >90)
GLUCOSE: 148 mg/dL — AB (ref 70–99)
POTASSIUM: 4.1 meq/L (ref 3.7–5.3)
Sodium: 141 mEq/L (ref 137–147)

## 2014-02-14 LAB — INFLUENZA PANEL BY PCR (TYPE A & B)
H1N1FLUPCR: NOT DETECTED
INFLAPCR: NEGATIVE
Influenza B By PCR: NEGATIVE

## 2014-02-14 LAB — GLUCOSE, CAPILLARY
GLUCOSE-CAPILLARY: 104 mg/dL — AB (ref 70–99)
Glucose-Capillary: 162 mg/dL — ABNORMAL HIGH (ref 70–99)
Glucose-Capillary: 163 mg/dL — ABNORMAL HIGH (ref 70–99)
Glucose-Capillary: 179 mg/dL — ABNORMAL HIGH (ref 70–99)

## 2014-02-14 LAB — CBC
HCT: 35.3 % — ABNORMAL LOW (ref 36.0–46.0)
Hemoglobin: 11.5 g/dL — ABNORMAL LOW (ref 12.0–15.0)
MCH: 28.5 pg (ref 26.0–34.0)
MCHC: 32.6 g/dL (ref 30.0–36.0)
MCV: 87.4 fL (ref 78.0–100.0)
Platelets: 223 10*3/uL (ref 150–400)
RBC: 4.04 MIL/uL (ref 3.87–5.11)
RDW: 13 % (ref 11.5–15.5)
WBC: 8.3 10*3/uL (ref 4.0–10.5)

## 2014-02-14 NOTE — Procedures (Signed)
ELECTROENCEPHALOGRAM REPORT  Patient: Nichole Garza       Room #: 2M35 EEG No. ID: [id number] Age: 53 y.o.        Sex: female Referring Physician: TAT, D Report Date:  02/14/2014        Interpreting Physician: Anthony Sar  History: Avagrace Shaw-Falconer is an 53 y.o. female with a history of diabetes mellitus and hypertension and hyperlipidemia who experienced an episode of loss of consciousness associated with chest discomfort.  Indications for study:  Rule out seizure disorder.  Technique: This is an 18 channel routine scalp EEG performed at the bedside with bipolar and monopolar montages arranged in accordance to the international 10/20 system of electrode placement.   Description: This EEG recording was performed during wakefulness. Background activity consisted of 9-10 Hz alpha rhythm which attenuated well with eye-opening. Photic stimulation produced symmetrical occipital driving response. Hyperventilation reduce a normal symmetrical transient slowing response. No epileptiform discharges were recorded. There was no abnormal slowing of cerebral activity.  Interpretation: This is a normal EEG recording during wakefulness. No evidence of an epileptic disorder was demonstrated.   Rush Farmer M.D. [facility] [phone]

## 2014-02-14 NOTE — Plan of Care (Signed)
Problem: Phase I Progression Outcomes Goal: Pain controlled with appropriate interventions Outcome: Completed/Met Date Met:  02/14/14     

## 2014-02-14 NOTE — Progress Notes (Signed)
Patient states that she would no longer like to take insulin here in the hospital- instead she would like to be placed back on her metformin. NP Baltazar Najjar paged concerning request.

## 2014-02-14 NOTE — Plan of Care (Signed)
Problem: Phase II Progression Outcomes Goal: Obtain order to discontinue catheter if appropriate Outcome: Not Applicable Date Met:  37/94/44

## 2014-02-14 NOTE — Progress Notes (Signed)
PROGRESS NOTE  Nichole Garza OVZ:[id number] DOB: [birth year] DOA: 02/13/2014 PCP: Benito Mccreedy, MD  Brief History 53 year old female with a history of fibromyalgia, diabetes mellitus, hypertension, hyperlipidemia presented with chest discomfort, sore throat for one day.  The patient went to urgent care for the chest discomfort and sore throat. The patient was given a GI cocktail after which she became unresponsive. The next thing she recalled was being in the emergency department. He continues to have intermittent chest discomfort and shortness of breath. She denies any bladder or bowel incontinence. There is no tongue biting. On the evening of 02/13/2014, the patient spiked a fever over 103.45F.  Assessment/Plan: Chest pain with episode of unresponsiveness -chest discomfort appears atypical -Patient is on mexiletine which she states she is taking for "burning in her legs" -She has never seen a cardiologist in the past -Given the patient's episode of unresponsiveness, I am concerned the patient may have had some type of dysrhythmia which may or may not have been related to the mexiletine -I have consulted cardiology -Orthostatic vital signs -EEG -Previous MRI of the brain 09/23/2013 negative -TSH 0.694 -troponins negative 2 Fever -Blood cultures 2 sets -Chest x-ray negative -Urinalysis negative -Throat culture for group A Streptococcus pending -Respiratory viral panel pending -Check influenza PCR -remain off antibiotics as the patient is hemodynamically stable Diabetes mellitus type 2 -NovoLog sliding scale -Discontinue metformin -Hemoglobin A1c Hypertension -Continue amlodipine and propranolol -Relatively controlled     Family Communication:   Pt at beside Disposition Plan:   Home when medically stable       Procedures/Studies: Ct Head Wo Contrast  02/13/2014   CLINICAL DATA:  53 year old female with altered mental status  EXAM: CT HEAD  WITHOUT CONTRAST  TECHNIQUE: Contiguous axial images were obtained from the base of the skull through the vertex without intravenous contrast.  COMPARISON:  08/20/2013  FINDINGS: No intracranial abnormalities are identified, including mass lesion or mass effect, hydrocephalus, extra-axial fluid collection, midline shift, hemorrhage, or acute infarction.  The visualized bony calvarium is unremarkable.  IMPRESSION: Unremarkable noncontrast head CT.   Electronically Signed   By: Hassan Rowan M.D.   On: 02/13/2014 15:22   Dg Chest Portable 1 View  02/13/2014   CLINICAL DATA:  53 year old female with altered mental status  EXAM: PORTABLE CHEST - 1 VIEW  COMPARISON:  None.  FINDINGS: Cardiac and mediastinal contours are within normal limits. Inspiratory volumes are low. No focal airspace consolidation, pleural effusion, pneumothorax or evidence of pulmonary edema. No acute osseous abnormality.  IMPRESSION: Low inspiratory volumes without evidence of acute cardiopulmonary process.   Electronically Signed   By: Jacqulynn Cadet M.D.   On: 02/13/2014 14:36         Subjective: Patient complains of intermittent chest discomfort with intermittent shortness of breath. Had a fever last night. Denies any coughing, hemoptysis, nausea, vomiting, diarrhea, hematochezia, dysuria, hematuria. No headache. Complains of some dizziness.  Objective: Filed Vitals:   02/13/14 2341 02/14/14 0039 02/14/14 0300 02/14/14 0442  BP:  135/84  138/74  Pulse:  103  77  Temp: 103 F (39.4 C) 100.4 F (38 C) 99.2 F (37.3 C) 99 F (37.2 C)  TempSrc: Oral Oral Oral Oral  Resp:  20  18  Height:  5\' 2"  (1.575 m)    Weight:  50 kg (110 lb 3.7 oz)    SpO2:  99%  99%   No intake or output data in the 24 hours  ending 02/14/14 0829 Weight change:  Exam:   General:  Pt is alert, follows commands appropriately, not in acute distress  HEENT: No icterus, No thrush, No neck mass, [place]/AT  Cardiovascular: RRR, S1/S2, no rubs, no  gallops  Respiratory: CTA bilaterally, no wheezing, no crackles, no rhonchi  Abdomen: Soft/+BS, non tender, non distended, no guarding  Extremities: No edema, No lymphangitis, No petechiae, No rashes, no synovitis  Data Reviewed: Basic Metabolic Panel:  Recent Labs Lab 02/13/14 1334 02/14/14 0728  NA 135* 141  K 4.5 4.1  CL 97 99  CO2 26 26  GLUCOSE 247* 148*  BUN 12 8  CREATININE 0.77 0.86  CALCIUM 8.9 9.1   Liver Function Tests:  Recent Labs Lab 02/13/14 1334  AST 23  ALT 24  ALKPHOS 124*  BILITOT 0.3  PROT 6.8  ALBUMIN 2.9*   No results for input(s): LIPASE, AMYLASE in the last 168 hours. No results for input(s): AMMONIA in the last 168 hours. CBC:  Recent Labs Lab 02/13/14 2129 02/14/14 0728  WBC 7.5 8.3  NEUTROABS 5.2  --   HGB 12.0 11.5*  HCT 37.3 35.3*  MCV 89.2 87.4  PLT 259 223   Cardiac Enzymes:  Recent Labs Lab 02/14/14 0019 02/14/14 0638  TROPONINI <0.30 <0.30   BNP: Invalid input(s): POCBNP CBG:  Recent Labs Lab 02/13/14 1320 02/13/14 2314 02/14/14 0819  GLUCAP 248* 146* 104*    Recent Results (from the past 240 hour(s))  Rapid strep screen     Status: None   Collection Time: 02/13/14  5:56 PM  Result Value Ref Range Status   Streptococcus, Group A Screen (Direct) NEGATIVE NEGATIVE Final    Comment: (NOTE) A Rapid Antigen test may result negative if the antigen level in the sample is below the detection level of this test. The FDA has not cleared this test as a stand-alone test therefore the rapid antigen negative result has reflexed to a Group A Strep culture.      Scheduled Meds: . amLODipine  5 mg Oral Daily  . aspirin EC  81 mg Oral q morning - 10a  . carbamazepine  100 mg Oral TID  . DULoxetine  30 mg Oral Daily  . enoxaparin (LOVENOX) injection  40 mg Subcutaneous Q24H  . gabapentin  800 mg Oral TID  . insulin aspart  0-5 Units Subcutaneous QHS  . insulin aspart  0-9 Units Subcutaneous TID WC  . mexiletine   150 mg Oral TID  . multivitamin with minerals  1 tablet Oral Daily  . pravastatin  10 mg Oral QHS  . propranolol  60 mg Oral BID  . sodium chloride  3 mL Intravenous Q12H  . sodium chloride  3 mL Intravenous Q12H   Continuous Infusions:    Vernadette Stutsman, DO  [facility] Pager [phone]  If 7PM-7AM, please contact night-coverage [email or web address] Password [email or web address] 02/14/2014, 8:29 AM   LOS: 1 day

## 2014-02-14 NOTE — Plan of Care (Signed)
Problem: Phase II Progression Outcomes Goal: Progress activity as tolerated unless otherwise ordered Outcome: Completed/Met Date Met:  02/14/14     

## 2014-02-14 NOTE — Plan of Care (Signed)
Problem: Phase I Progression Outcomes Goal: Hemodynamically stable Outcome: Completed/Met Date Met:  02/14/14     

## 2014-02-14 NOTE — Progress Notes (Signed)
UR completed 

## 2014-02-14 NOTE — Plan of Care (Signed)
Problem: Phase II Progression Outcomes Goal: IV changed to normal saline lock Outcome: Completed/Met Date Met:  02/14/14     

## 2014-02-14 NOTE — Progress Notes (Signed)
Lab unable to get blood. 3 techs tried. NP Baltazar Najjar notified.

## 2014-02-14 NOTE — Consult Note (Signed)
Reason for Consult: Syncope, chest pain  Requesting Physician: Tat  Cardiologist: None  HPI: This is a 53 y.o. perimenopausal female with a past medical history significant for 7-years of well controlled DM with severe peripheral neuropathy, HTN and fibromyalgia, admitted after a syncopal event that occurred during evaluation for chest pain in Urgent Care. The syncope occurred immediately following a GI cocktail and was not heralded by any prodromal symptoms. She states that she has never experienced syncope before, but her chart documents fainting spells after taking hydrocodone and Relpax in the past. Her chest discomfort was low retrosternal and accompanied by throat pain. No dysphagia reported. No relationship to physical exertion or meals. Normal cardiac enzymes, subtle lateral T wave inversion laterally is old.  She has developed fever to 103 deg F, but the exact cause is not yet established. Flu swab pending, rapid strep negative. CXR and UA unrevealing.  The mexiletine has led to marked benefit for her "burning" neuropathic symptoms (returned after cessation of med and resolved when mexiletine restarted) for which she is also taking carbamazepine, gabapentin and duloxetine.  PMHx:  Past Medical History  Diagnosis Date  . Diabetes mellitus   . Hypertension   . Nerve pain   . Fibromyalgia   . TIA (transient ischemic attack)   . Migraine headache   . Arthritis     in her spine  . Renal disorder     kidney stone   Past Surgical History  Procedure Laterality Date  . Cystoscopy with retrograde pyelogram, ureteroscopy and stent placement Right 05/29/2012    Procedure: [place], URETEROSCOPY AND STENT PLACEMENT;  Surgeon: Hanley Ben, MD;  Location: [facility];  Service: Urology;  Laterality: Right;  . Holmium laser application Right 05/30/6008    Procedure: HOLMIUM LASER APPLICATION;  Surgeon: Hanley Ben, MD;  Location: [facility];  Service:  Urology;  Laterality: Right;  . Abdominal hysterectomy  2012    FAMHx: Family History  Problem Relation Age of Onset  . Diabetes Mother   . Diabetes Father   . Cancer Brother     SOCHx:  reports that she has never smoked. She has never used smokeless tobacco. She reports that she does not drink alcohol or use illicit drugs.  ALLERGIES: Allergies  Allergen Reactions  . Enalapril Anaphylaxis  . Hydrocodone     Does not remember what happened-she woke up in her kitchen  . Latex Swelling    Lips and face  . Relpax [Eletriptan] Other (See Comments)    "fainting"-knocked her out for the whole day    ROS: The patient specifically denies dyspnea at rest or with exertion, orthopnea, paroxysmal nocturnal dyspnea, focal neurological deficits, intermittent claudication, lower extremity edema, unexplained weight gain, cough, hemoptysis or wheezing.  The patient also denies abdominal pain, nausea, vomiting, dysphagia, diarrhea, constipation, polyuria, polydipsia, dysuria, hematuria, frequency, urgency, abnormal bleeding or bruising, chills, unexpected weight changes, mood swings, change in skin or hair texture, change in voice quality, auditory or visual problems, allergic reactions or rashes, new musculoskeletal complaints other than usual "aches and pains".  Pertinent items are noted in HPI.  HOME MEDICATIONS: Prescriptions prior to admission  Medication Sig Dispense Refill Last Dose  . Alpha-Lipoic Acid 100 MG CAPS Take 100 mg by mouth daily.   02/13/2014 at Unknown time  . amLODipine (NORVASC) 5 MG tablet Take 5 mg by mouth daily.  1 02/13/2014 at Unknown time  . aspirin EC 81 MG tablet Take 81  mg by mouth every morning.   02/13/2014 at Unknown time  . carbamazepine (TEGRETOL) 100 MG chewable tablet Chew 100 mg by mouth 3 (three) times daily.  3 02/13/2014 at Unknown time  . DULoxetine (CYMBALTA) 30 MG capsule Take 30 mg by mouth daily.   02/13/2014 at Unknown time  . fish oil-omega-3  fatty acids 1000 MG capsule Take 1 g by mouth 3 (three) times daily.    02/13/2014 at Unknown time  . gabapentin (NEURONTIN) 800 MG tablet Take 800 mg by mouth 3 (three) times daily.   02/13/2014 at Unknown time  . GINKGO BILOBA COMPLEX PO Take 1 capsule by mouth daily.    02/13/2014 at Unknown time  . losartan (COZAAR) 50 MG tablet Take 50 mg by mouth daily.   02/13/2014 at Unknown time  . metFORMIN (GLUCOPHAGE) 500 MG tablet Take 500 mg by mouth 2 (two) times daily with a meal.   02/13/2014 at Unknown time  . mexiletine (MEXITIL) 150 MG capsule Take 150 mg by mouth 3 (three) times daily.   02/13/2014 at Unknown time  . Multiple Vitamin (MULTIVITAMIN WITH MINERALS) TABS Take 1 tablet by mouth daily.   02/13/2014 at Unknown time  . pravastatin (PRAVACHOL) 10 MG tablet Take 10 mg by mouth at bedtime.   02/12/2014 at Unknown time  . propranolol (INDERAL) 60 MG tablet Take 60 mg by mouth 2 (two) times daily.   02/13/2014 at 830am  . traMADol (ULTRAM) 50 MG tablet Take 50 mg by mouth every 6 (six) hours as needed. For pain   02/13/2014 at Unknown time  . vitamin C (ASCORBIC ACID) 500 MG tablet Take 500 mg by mouth daily.   02/13/2014 at Unknown time  . HYDROcodone-acetaminophen (NORCO/VICODIN) 5-325 MG per tablet Take 1-2 tablets by mouth every 6 (six) hours as needed. (Patient not taking: Reported on 02/13/2014) 15 tablet 0     HOSPITAL MEDICATIONS: Prior to Admission:  Prescriptions prior to admission  Medication Sig Dispense Refill Last Dose  . Alpha-Lipoic Acid 100 MG CAPS Take 100 mg by mouth daily.   02/13/2014 at Unknown time  . amLODipine (NORVASC) 5 MG tablet Take 5 mg by mouth daily.  1 02/13/2014 at Unknown time  . aspirin EC 81 MG tablet Take 81 mg by mouth every morning.   02/13/2014 at Unknown time  . carbamazepine (TEGRETOL) 100 MG chewable tablet Chew 100 mg by mouth 3 (three) times daily.  3 02/13/2014 at Unknown time  . DULoxetine (CYMBALTA) 30 MG capsule Take 30 mg by mouth  daily.   02/13/2014 at Unknown time  . fish oil-omega-3 fatty acids 1000 MG capsule Take 1 g by mouth 3 (three) times daily.    02/13/2014 at Unknown time  . gabapentin (NEURONTIN) 800 MG tablet Take 800 mg by mouth 3 (three) times daily.   02/13/2014 at Unknown time  . GINKGO BILOBA COMPLEX PO Take 1 capsule by mouth daily.    02/13/2014 at Unknown time  . losartan (COZAAR) 50 MG tablet Take 50 mg by mouth daily.   02/13/2014 at Unknown time  . metFORMIN (GLUCOPHAGE) 500 MG tablet Take 500 mg by mouth 2 (two) times daily with a meal.   02/13/2014 at Unknown time  . mexiletine (MEXITIL) 150 MG capsule Take 150 mg by mouth 3 (three) times daily.   02/13/2014 at Unknown time  . Multiple Vitamin (MULTIVITAMIN WITH MINERALS) TABS Take 1 tablet by mouth daily.   02/13/2014 at Unknown time  . pravastatin (PRAVACHOL)  10 MG tablet Take 10 mg by mouth at bedtime.   02/12/2014 at Unknown time  . propranolol (INDERAL) 60 MG tablet Take 60 mg by mouth 2 (two) times daily.   02/13/2014 at 830am  . traMADol (ULTRAM) 50 MG tablet Take 50 mg by mouth every 6 (six) hours as needed. For pain   02/13/2014 at Unknown time  . vitamin C (ASCORBIC ACID) 500 MG tablet Take 500 mg by mouth daily.   02/13/2014 at Unknown time  . HYDROcodone-acetaminophen (NORCO/VICODIN) 5-325 MG per tablet Take 1-2 tablets by mouth every 6 (six) hours as needed. (Patient not taking: Reported on 02/13/2014) 15 tablet 0     VITALS: Blood pressure 170/79, pulse 90, temperature 98.5 F (36.9 C), temperature source Oral, resp. rate 16, height 5\' 2"  (1.575 m), weight 110 lb 3.7 oz (50 kg), SpO2 97 %.  PHYSICAL EXAM:  General: Alert, oriented x3, no distress Head: no evidence of trauma, PERRL, EOMI, no exophtalmos or lid lag, no myxedema, no xanthelasma; normal ears, nose and oropharynx Neck: normal jugular venous pulsations and no hepatojugular reflux; brisk carotid pulses without delay and no carotid bruits Chest: clear to auscultation, no  signs of consolidation by percussion or palpation, normal fremitus, symmetrical and full respiratory excursions Cardiovascular: normal position and quality of the apical impulse, regular rhythm, normal first heart sound and loud second heart sound, no rubs or gallops, no murmur Abdomen: no tenderness or distention, no masses by palpation, no abnormal pulsatility or arterial bruits, normal bowel sounds, no hepatosplenomegaly Extremities: no clubbing, cyanosis;  no edema; 2+ radial, ulnar and brachial pulses bilaterally; 2+ right femoral, posterior tibial and dorsalis pedis pulses; 2+ left femoral, posterior tibial and dorsalis pedis pulses; no subclavian or femoral bruits Neurological: grossly nonfocal   LABS  CBC  Recent Labs  02/13/14 2129 02/14/14 0728  WBC 7.5 8.3  NEUTROABS 5.2  --   HGB 12.0 11.5*  HCT 37.3 35.3*  MCV 89.2 87.4  PLT 259 147   Basic Metabolic Panel  Recent Labs  02/13/14 1334 02/14/14 0728  NA 135* 141  K 4.5 4.1  CL 97 99  CO2 26 26  GLUCOSE 247* 148*  BUN 12 8  CREATININE 0.77 0.86  CALCIUM 8.9 9.1   Liver Function Tests  Recent Labs  02/13/14 1334  AST 23  ALT 24  ALKPHOS 124*  BILITOT 0.3  PROT 6.8  ALBUMIN 2.9*   No results for input(s): LIPASE, AMYLASE in the last 72 hours. Cardiac Enzymes  Recent Labs  02/14/14 0019 02/14/14 0638  TROPONINI <0.30 <0.30   BNP Invalid input(s): POCBNP D-Dimer No results for input(s): DDIMER in the last 72 hours. Hemoglobin A1C No results for input(s): HGBA1C in the last 72 hours. Fasting Lipid Panel No results for input(s): CHOL, HDL, LDLCALC, TRIG, CHOLHDL, LDLDIRECT in the last 72 hours. Thyroid Function Tests  Recent Labs  02/13/14 1334  TSH 0.694      IMAGING: Ct Head Wo Contrast  02/13/2014   CLINICAL DATA:  53 year old female with altered mental status  EXAM: CT HEAD WITHOUT CONTRAST  TECHNIQUE: Contiguous axial images were obtained from the base of the skull through the  vertex without intravenous contrast.  COMPARISON:  08/20/2013  FINDINGS: No intracranial abnormalities are identified, including mass lesion or mass effect, hydrocephalus, extra-axial fluid collection, midline shift, hemorrhage, or acute infarction.  The visualized bony calvarium is unremarkable.  IMPRESSION: Unremarkable noncontrast head CT.   Electronically Signed   By: Merry Proud  Hu M.D.   On: 02/13/2014 15:22   Dg Chest Portable 1 View  02/13/2014   CLINICAL DATA:  53 year old female with altered mental status  EXAM: PORTABLE CHEST - 1 VIEW  COMPARISON:  None.  FINDINGS: Cardiac and mediastinal contours are within normal limits. Inspiratory volumes are low. No focal airspace consolidation, pleural effusion, pneumothorax or evidence of pulmonary edema. No acute osseous abnormality.  IMPRESSION: Low inspiratory volumes without evidence of acute cardiopulmonary process.   Electronically Signed   By: Jacqulynn Cadet M.D.   On: 02/13/2014 14:36    ECG: NSR, subtle lateral T wave changes  TELEMETRY: Not on telemetry  IMPRESSION/PLAN: 1. Syncope - concern for arrhythmia is warranted due to the abrupt nature of the event, but on the other hand she had other events that were probably vasovagal. She should be on telemetry and if no arrhythmia is identified during her hospitalization, recommend a 30 day event monitor. Mexiletine rarely causes syncope (<1% risk of syncope due to bradycardia and/or hypotension). In addition, it has provided her with substantial benefit for her neuropathy. It is reasonable to stop the medication while we finish her evaluation, but she may be able to restart it if no arrhythmia is identified. 2. Chest pain - could be angina pectoris, but seems more likely to be associated with her febrile illness. She has intermediate risk factor burden for CAD. Recommend an echo to evaluate for signs of pericarditis, to evaluate her rather loud second heart sound and to look for possible regional  wall motion abnormalities as a sign of ischemia. After her acute febrile illness resolves a treadmill stress test is very reasonable.  RECOMMENDATION: 1. Telemetry 2. Echo 3. Stress test after resolution of fever 4. D/C mexiletine temporarily  Time Spent Directly with Patient: 60 minutes  Sanda Klein, MD, [facility] [phone] office [phone] pager   02/14/2014, 11:18 AM

## 2014-02-14 NOTE — Plan of Care (Signed)
Problem: Phase I Progression Outcomes Goal: OOB as tolerated unless otherwise ordered Outcome: Completed/Met Date Met:  02/14/14 Goal: Voiding-avoid urinary catheter unless indicated Outcome: Completed/Met Date Met:  02/14/14

## 2014-02-14 NOTE — Progress Notes (Signed)
EEG completed; results pending.    

## 2014-02-15 ENCOUNTER — Observation Stay (HOSPITAL_COMMUNITY): Payer: 59

## 2014-02-15 DIAGNOSIS — R404 Transient alteration of awareness: Secondary | ICD-10-CM

## 2014-02-15 DIAGNOSIS — E119 Type 2 diabetes mellitus without complications: Secondary | ICD-10-CM

## 2014-02-15 DIAGNOSIS — I059 Rheumatic mitral valve disease, unspecified: Secondary | ICD-10-CM

## 2014-02-15 LAB — CBC
HCT: 32.5 % — ABNORMAL LOW (ref 36.0–46.0)
Hemoglobin: 10.6 g/dL — ABNORMAL LOW (ref 12.0–15.0)
MCH: 27.9 pg (ref 26.0–34.0)
MCHC: 32.6 g/dL (ref 30.0–36.0)
MCV: 85.5 fL (ref 78.0–100.0)
Platelets: 259 10*3/uL (ref 150–400)
RBC: 3.8 MIL/uL — ABNORMAL LOW (ref 3.87–5.11)
RDW: 12.7 % (ref 11.5–15.5)
WBC: 6.3 10*3/uL (ref 4.0–10.5)

## 2014-02-15 LAB — GLUCOSE, CAPILLARY
GLUCOSE-CAPILLARY: 196 mg/dL — AB (ref 70–99)
Glucose-Capillary: 217 mg/dL — ABNORMAL HIGH (ref 70–99)
Glucose-Capillary: 112 mg/dL — ABNORMAL HIGH (ref 70–99)
Glucose-Capillary: 330 mg/dL — ABNORMAL HIGH (ref 70–99)

## 2014-02-15 LAB — CULTURE, GROUP A STREP

## 2014-02-15 LAB — BASIC METABOLIC PANEL
ANION GAP: 13 (ref 5–15)
BUN: 5 mg/dL — ABNORMAL LOW (ref 6–23)
CALCIUM: 9.3 mg/dL (ref 8.4–10.5)
CO2: 28 mEq/L (ref 19–32)
CREATININE: 0.86 mg/dL (ref 0.50–1.10)
Chloride: 97 mEq/L (ref 96–112)
GFR calc Af Amer: 87 mL/min — ABNORMAL LOW (ref >90)
GFR, EST NON AFRICAN AMERICAN: 75 mL/min — AB (ref >90)
Glucose, Bld: 181 mg/dL — ABNORMAL HIGH (ref 70–99)
Potassium: 4.3 mEq/L (ref 3.7–5.3)
Sodium: 138 mEq/L (ref 137–147)

## 2014-02-15 LAB — HEMOGLOBIN A1C
HEMOGLOBIN A1C: 7.9 % — AB (ref <5.7)
MEAN PLASMA GLUCOSE: 180 mg/dL — AB (ref <117)

## 2014-02-15 NOTE — Progress Notes (Signed)
Patient Name: Nichole Garza Date of Encounter: 02/15/2014     Active Problems:   Diabetes mellitus   Fibromyalgia   Altered mental status   Sore throat   Acute encephalopathy   Atypical chest pain   Fever presenting with conditions classified elsewhere    SUBJECTIVE  Some recurrent chest pain this AM. Worse with deep inspiration. No SOB or pre/sycnope.   CURRENT MEDS . amLODipine  5 mg Oral Daily  . aspirin EC  81 mg Oral q morning - 10a  . carbamazepine  100 mg Oral TID  . DULoxetine  30 mg Oral Daily  . enoxaparin (LOVENOX) injection  40 mg Subcutaneous Q24H  . gabapentin  800 mg Oral TID  . insulin aspart  0-5 Units Subcutaneous QHS  . insulin aspart  0-9 Units Subcutaneous TID WC  . mexiletine  150 mg Oral TID  . multivitamin with minerals  1 tablet Oral Daily  . pravastatin  10 mg Oral QHS  . propranolol  60 mg Oral BID  . sodium chloride  3 mL Intravenous Q12H  . sodium chloride  3 mL Intravenous Q12H    OBJECTIVE  Filed Vitals:   02/14/14 0908 02/14/14 1523 02/14/14 2258 02/15/14 0625  BP: 170/79 152/87 119/71 139/73  Pulse: 90 86 70 65  Temp: 98.5 F (36.9 C) 98.5 F (36.9 C) 98.4 F (36.9 C) 98.4 F (36.9 C)  TempSrc: Oral Oral Oral Oral  Resp: 16 16 18 14   Height:      Weight:      SpO2: 97% 100% 100% 100%    Intake/Output Summary (Last 24 hours) at 02/15/14 1010 Last data filed at 02/14/14 1339  Gross per 24 hour  Intake    480 ml  Output      0 ml  Net    480 ml   Filed Weights   02/14/14 0039  Weight: 110 lb 3.7 oz (50 kg)    PHYSICAL EXAM  General: Pleasant, NAD. Neuro: Alert and oriented X 3. Moves all extremities spontaneously. Psych: Normal affect. HEENT:  Normal  Neck: Supple without bruits or JVD. Lungs:  Resp regular and unlabored, CTA. Heart: RRR no s3, s4, or murmurs. Loud second heart sound Abdomen: Soft, non-tender, non-distended, BS + x 4.  Extremities: No clubbing, cyanosis or edema. DP/PT/Radials 2+ and  equal bilaterally.  Accessory Clinical Findings  CBC  Recent Labs  02/13/14 2129 02/14/14 0728 02/15/14 0534  WBC 7.5 8.3 6.3  NEUTROABS 5.2  --   --   HGB 12.0 11.5* 10.6*  HCT 37.3 35.3* 32.5*  MCV 89.2 87.4 85.5  PLT 259 223 518   Basic Metabolic Panel  Recent Labs  02/14/14 0728 02/15/14 0534  NA 141 138  K 4.1 4.3  CL 99 97  CO2 26 28  GLUCOSE 148* 181*  BUN 8 5*  CREATININE 0.86 0.86  CALCIUM 9.1 9.3   Liver Function Tests  Recent Labs  02/13/14 1334  AST 23  ALT 24  ALKPHOS 124*  BILITOT 0.3  PROT 6.8  ALBUMIN 2.9*   Cardiac Enzymes  Recent Labs  02/14/14 0019 02/14/14 0638 02/14/14 2248  TROPONINI <0.30 <0.30 <0.30   Hemoglobin A1C  Recent Labs  02/14/14 2248  HGBA1C 7.9*    Thyroid Function Tests  Recent Labs  02/13/14 1334  TSH 0.694    TELE  NSR  Radiology/Studies  Ct Head Wo Contrast  02/13/2014   CLINICAL DATA:  53 year old female with altered mental  status  EXAM: CT HEAD WITHOUT CONTRAST  TECHNIQUE: Contiguous axial images were obtained from the base of the skull through the vertex without intravenous contrast.  COMPARISON:  08/20/2013  FINDINGS: No intracranial abnormalities are identified, including mass lesion or mass effect, hydrocephalus, extra-axial fluid collection, midline shift, hemorrhage, or acute infarction.  The visualized bony calvarium is unremarkable.  IMPRESSION: Unremarkable noncontrast head CT.   Electronically Signed   By: Hassan Rowan M.D.   On: 02/13/2014 15:22   Dg Chest Portable 1 View  02/13/2014   CLINICAL DATA:  53 year old female with altered mental status  EXAM: PORTABLE CHEST - 1 VIEW  COMPARISON:  None.  FINDINGS: Cardiac and mediastinal contours are within normal limits. Inspiratory volumes are low. No focal airspace consolidation, pleural effusion, pneumothorax or evidence of pulmonary edema. No acute osseous abnormality.  IMPRESSION: Low inspiratory volumes without evidence of acute  cardiopulmonary process.   Electronically Signed   By: Jacqulynn Cadet M.D.   On: 02/13/2014 14:36   Dg Abd Portable 1v  02/15/2014   CLINICAL DATA:  53 year old female with mid abdominal pain radiating to the right shoulder and neck. Initial encounter.  EXAM: PORTABLE ABDOMEN - 1 VIEW  COMPARISON:  CT Abdomen and Pelvis 05/28/2012.  FINDINGS: Portable AP supine view at 0653 hrs. Non obstructed bowel gas pattern. Mild to moderate volume of retained stool throughout the colon. Negative lung bases. No definite pneumoperitoneum on this supine view. No acute osseous abnormality identified.  IMPRESSION: Negative supine view of the abdomen.   Electronically Signed   By: Lars Pinks M.D.   On: 02/15/2014 07:15    ASSESSMENT AND PLAN  Nichole Garza is a 53 y.o. female with a history of DM with peripheral neuropathy on mexilitine, HTN and fibromyalgia who was admitted to [facility] on 02/13/14 after a syncopal event that occurred during an evaluation for chest pain at an urgent care. She was also noted to be febrile to 103 deg F.  Syncope - concern for arrhythmia is warranted due to the abrupt nature of the event, but on the other hand she had other events that were probably vasovagal.  -- EEG normal per neuro -- Placed on tele with no events noted. Will discharge with 30 day event monitor recommended  -- D/C mexiletine temporarily as Mexiletine rarely causes syncope (<1% risk of syncope due to bradycardia and/or hypotension). She may be able to restart it if no arrhythmia is identified per Dr. Loletha Grayer  Chest pain - could be angina pectoris, but seems more likely to be associated with her febrile illness. She has intermediate risk factor burden for CAD -- Troponin neg and ECG with no acute ST changes.  -- Echo to evaluate for signs of pericarditis and evaluate her rather loud second heart sound and to look for possible regional wall motion abnormalities as a sign of ischemia. This should be done today.  -- ETT  stress test recommended after resolution of fever and feeling a little better  Fever- now resolved. No source identified.  -- patient still with general malaise  DM- HgA1c 7.9 -- Discontinued mexilitine for peripheral neuropathy.   HTN- continue amlodipine  HLD- continue statin    Signed, Eileen Stanford PA-C  Pager [phone]  I have examined the patient and reviewed assessment and plan and discussed with patient.  Agree with above as stated.  Musculoskeletal pain in neck and upper back as well as chest.  Agree with monitor.  Plan ETT in the future when  she has recovered. OK to work while wearing the monitor.   VARANASI,JAYADEEP S.

## 2014-02-15 NOTE — Plan of Care (Signed)
Problem: Phase II Progression Outcomes Goal: Vital signs remain stable Outcome: Progressing     

## 2014-02-15 NOTE — Progress Notes (Signed)
Patient states that her upper abdomen is hurting and that the pain is radiating to her neck and right shoulder. Pain medication given. NP Baltazar Najjar notified.

## 2014-02-15 NOTE — Progress Notes (Signed)
UR completed 

## 2014-02-15 NOTE — Progress Notes (Signed)
PROGRESS NOTE  Nichole Garza GEX:[id number] DOB: [birth year] DOA: 02/13/2014 PCP: Benito Mccreedy, MD   Brief History 53 year old female with a history of fibromyalgia, diabetes mellitus, hypertension, hyperlipidemia presented with chest discomfort, sore throat for one day. The patient went to urgent care for the chest discomfort and sore throat. The patient was given a GI cocktail after which she became unresponsive. The next thing she recalled was being in the emergency department. He continues to have intermittent chest discomfort and shortness of breath. She denies any bladder or bowel incontinence. There is no tongue biting. On the evening of 02/13/2014, the patient spiked a fever over 103.23F. the patient has remained off antibiotics as she has remained hemodynamically stable Assessment/Plan: Chest pain with episode of unresponsiveness -chest discomfort appears atypical -Patient is on mexiletine which she states she is taking for "burning in her legs" -Per cardiology--hold off on restarting mexiletine in the short-term -appreciate cardiology consult--> Plans noted for event monitor at the time of discharge -Exercise stress test--defer to cardiology regarding the timing of the stress test -Given the patient's episode of unresponsiveness, I am concerned the patient may have had some type of dysrhythmia which may or may not have been related to the mexiletine -Orthostatic vital signs--negative -EEG--negative -Previous MRI of the brain 09/23/2013 negative -TSH 0.694 -troponins negative 3 Fever -likely viral syndrome as the patient's workup has been negative and she is clinically stable -Blood cultures 2 sets--negative -Chest x-ray negative -Urinalysis negative -Throat culture for group A Streptococcus pending -Respiratory viral panel--neg -Check influenza PCR--negative -remain off antibiotics as the patient is hemodynamically stable Diabetes mellitus type  2 -NovoLog sliding scale -Discontinue metformin -Hemoglobin A1c--7.9 -Patient can restart metformin after discharge Hypertension -Continue amlodipine and propranolol -Relatively controlled  Family Communication: husband updated at beside Disposition Plan: Home when medically stable   Procedures/Studies: Ct Head Wo Contrast  02/13/2014   CLINICAL DATA:  53 year old female with altered mental status  EXAM: CT HEAD WITHOUT CONTRAST  TECHNIQUE: Contiguous axial images were obtained from the base of the skull through the vertex without intravenous contrast.  COMPARISON:  08/20/2013  FINDINGS: No intracranial abnormalities are identified, including mass lesion or mass effect, hydrocephalus, extra-axial fluid collection, midline shift, hemorrhage, or acute infarction.  The visualized bony calvarium is unremarkable.  IMPRESSION: Unremarkable noncontrast head CT.   Electronically Signed   By: Hassan Rowan M.D.   On: 02/13/2014 15:22   Dg Chest Portable 1 View  02/13/2014   CLINICAL DATA:  53 year old female with altered mental status  EXAM: PORTABLE CHEST - 1 VIEW  COMPARISON:  None.  FINDINGS: Cardiac and mediastinal contours are within normal limits. Inspiratory volumes are low. No focal airspace consolidation, pleural effusion, pneumothorax or evidence of pulmonary edema. No acute osseous abnormality.  IMPRESSION: Low inspiratory volumes without evidence of acute cardiopulmonary process.   Electronically Signed   By: Jacqulynn Cadet M.D.   On: 02/13/2014 14:36   Dg Abd Portable 1v  02/15/2014   CLINICAL DATA:  53 year old female with mid abdominal pain radiating to the right shoulder and neck. Initial encounter.  EXAM: PORTABLE ABDOMEN - 1 VIEW  COMPARISON:  CT Abdomen and Pelvis 05/28/2012.  FINDINGS: Portable AP supine view at 0653 hrs. Non obstructed bowel gas pattern. Mild to moderate volume of retained stool throughout the colon. Negative lung bases. No definite pneumoperitoneum on this  supine view. No acute osseous abnormality identified.  IMPRESSION: Negative supine view of the abdomen.  Electronically Signed   By: Lars Pinks M.D.   On: 02/15/2014 07:15         Subjective: Patient complains of cervical paraspinal pain and shoulder pain. Denies any fevers, chills, intermittent chest discomfort, shortness breath, nausea, vomiting, diarrhea, abdominal pain, dysuria, hematochezia, melena.  Objective: Filed Vitals:   02/14/14 0908 02/14/14 1523 02/14/14 2258 02/15/14 0625  BP: 170/79 152/87 119/71 139/73  Pulse: 90 86 70 65  Temp: 98.5 F (36.9 C) 98.5 F (36.9 C) 98.4 F (36.9 C) 98.4 F (36.9 C)  TempSrc: Oral Oral Oral Oral  Resp: _0 Height:      Weight:      SpO2: 97% 100% 100% 100%    Intake/Output Summary (Last 24 hours) at 02/15/14 1420 Last data filed at 02/15/14 1104  Gross per 24 hour  Intake    240 ml  Output      0 ml  Net    240 ml   Weight change:  Exam:   General:  Pt is alert, follows commands appropriately, not in acute distress  HEENT: No icterus, No thrush, No meningismus,  [place]/AT  Cardiovascular: RRR, S1/S2, no rubs, no gallops  Respiratory: CTA bilaterally, no wheezing, no crackles, no rhonchi  Abdomen: Soft/+BS, non tender, non distended, no guarding  Extremities: No edema, No lymphangitis, No petechiae, No rashes, no synovitis  Data Reviewed: Basic Metabolic Panel:  Recent Labs Lab 02/13/14 1334 02/14/14 0728 02/15/14 0534  NA 135* 141 138  K 4.5 4.1 4.3  CL 97 99 97  CO2 _1 GLUCOSE 247* 148* 181*  BUN 12 8 5*  CREATININE 0.77 0.86 0.86  CALCIUM 8.9 9.1 9.3   Liver Function Tests:  Recent Labs Lab 02/13/14 1334  AST 23  ALT 24  ALKPHOS 124*  BILITOT 0.3  PROT 6.8  ALBUMIN 2.9*   No results for input(s): LIPASE, AMYLASE in the last 168 hours. No results for input(s): AMMONIA in the last 168 hours. CBC:  Recent Labs Lab 02/13/14 2129 02/14/14 0728 02/15/14 0534  WBC 7.5 8.3 6.3   NEUTROABS 5.2  --   --   HGB 12.0 11.5* 10.6*  HCT 37.3 35.3* 32.5*  MCV 89.2 87.4 85.5  PLT 259 223 259   Cardiac Enzymes:  Recent Labs Lab 02/14/14 0019 02/14/14 0638 02/14/14 2248  TROPONINI <0.30 <0.30 <0.30   BNP: Invalid input(s): POCBNP CBG:  Recent Labs Lab 02/14/14 1221 02/14/14 1746 02/14/14 2252 02/15/14 0738 02/15/14 1247  GLUCAP 162* 163* 179* 217* 196*    Recent Results (from the past 240 hour(s))  Rapid strep screen     Status: None   Collection Time: 02/13/14  5:56 PM  Result Value Ref Range Status   Streptococcus, Group A Screen (Direct) NEGATIVE NEGATIVE Final    Comment: (NOTE) A Rapid Antigen test may result negative if the antigen level in the sample is below the detection level of this test. The FDA has not cleared this test as a stand-alone test therefore the rapid antigen negative result has reflexed to a Group A Strep culture.   Culture, Group A Strep     Status: None   Collection Time: 02/13/14  5:56 PM  Result Value Ref Range Status   Specimen Description THROAT  Final   Special Requests NONE  Final   Culture   Final    No Beta Hemolytic Streptococci Isolated Performed at [facility]    Report Status 02/15/2014 FINAL  Final  Respiratory virus panel (routine influenza)     Status: None   Collection Time: 02/13/14 11:50 PM  Result Value Ref Range Status   Source - RVPAN NASAL SWAB  Corrected    Comment: CORRECTED ON 11/16 AT 2328: PREVIOUSLY REPORTED AS NASAL SWAB   Respiratory Syncytial Virus A NOT DETECTED  Final   Respiratory Syncytial Virus B NOT DETECTED  Final   Influenza A NOT DETECTED  Final   Influenza B NOT DETECTED  Final   Parainfluenza 1 NOT DETECTED  Final   Parainfluenza 2 NOT DETECTED  Final   Parainfluenza 3 NOT DETECTED  Final   Metapneumovirus NOT DETECTED  Final   Rhinovirus NOT DETECTED  Final   Adenovirus NOT DETECTED  Final   Influenza A H1 NOT DETECTED  Final   Influenza A H3 NOT DETECTED   Final    Comment: (NOTE)       Normal Reference Range for each Analyte: NOT DETECTED Testing performed using the Luminex xTAG Respiratory Viral Panel test kit. The analytical performance characteristics of this assay have been determined by [facility].  The modifications have not been cleared or approved by the FDA. This assay has been validated pursuant to the CLIA regulations and is used for clinical purposes. Performed at [facility]      Scheduled Meds: . amLODipine  5 mg Oral Daily  . aspirin EC  81 mg Oral q morning - 10a  . carbamazepine  100 mg Oral TID  . DULoxetine  30 mg Oral Daily  . enoxaparin (LOVENOX) injection  40 mg Subcutaneous Q24H  . gabapentin  800 mg Oral TID  . insulin aspart  0-5 Units Subcutaneous QHS  . insulin aspart  0-9 Units Subcutaneous TID WC  . mexiletine  150 mg Oral TID  . multivitamin with minerals  1 tablet Oral Daily  . pravastatin  10 mg Oral QHS  . propranolol  60 mg Oral BID  . sodium chloride  3 mL Intravenous Q12H  . sodium chloride  3 mL Intravenous Q12H   Continuous Infusions:    Cleda Imel, DO  [facility] Pager [phone]  If 7PM-7AM, please contact night-coverage [email or web address] Password [email or web address] 02/15/2014, 2:20 PM   LOS: 2 days

## 2014-02-15 NOTE — Progress Notes (Signed)
  Echocardiogram 2D Echocardiogram has been performed.  Donata Clay 02/15/2014, 12:25 PM

## 2014-02-16 ENCOUNTER — Other Ambulatory Visit: Payer: Self-pay | Admitting: Physician Assistant

## 2014-02-16 DIAGNOSIS — R002 Palpitations: Secondary | ICD-10-CM

## 2014-02-16 LAB — GLUCOSE, CAPILLARY
Glucose-Capillary: 172 mg/dL — ABNORMAL HIGH (ref 70–99)
Glucose-Capillary: 212 mg/dL — ABNORMAL HIGH (ref 70–99)
Glucose-Capillary: 155 mg/dL — ABNORMAL HIGH (ref 70–99)

## 2014-02-16 MED ORDER — MAGNESIUM HYDROXIDE 400 MG/5ML PO SUSP
30.0000 mL | Freq: Once | ORAL | Status: AC
  Administered 2014-02-16: 30 mL via ORAL
  Filled 2014-02-16: qty 30

## 2014-02-16 MED ORDER — INSULIN GLARGINE 100 UNIT/ML ~~LOC~~ SOLN
5.0000 [IU] | Freq: Every day | SUBCUTANEOUS | Status: DC
  Administered 2014-02-16: 5 [IU] via SUBCUTANEOUS
  Filled 2014-02-16 (×2): qty 0.05

## 2014-02-16 NOTE — Plan of Care (Signed)
Problem: Phase II Progression Outcomes Goal: Vital signs remain stable Outcome: Completed/Met Date Met:  02/16/14     

## 2014-02-16 NOTE — Progress Notes (Signed)
PROGRESS NOTE  Nichole Garza RCB:[id number] DOB: Aug 22, 1960 DOA: 02/13/2014 PCP: Benito Mccreedy, MD   Brief History 53 year old female with a history of fibromyalgia, diabetes mellitus, hypertension, hyperlipidemia presented with chest discomfort, sore throat for one day. The patient went to urgent care for the chest discomfort and sore throat. The patient was given a GI cocktail after which she became unresponsive. The next thing she recalled was being in the emergency department. On the evening of 02/13/2014, the patient spiked a fever over 103.40F.  She continues to have intermittent chest discomfort and shortness of breath but these have improved. She denies any bladder or bowel incontinence. There is no tongue biting. The patient has remained off antibiotics as she has remained hemodynamically stable.     Assessment/Plan: Chest pain with episode of unresponsiveness Chest discomfort appears musculoskeletal.  Worse with movement and inspiration. Cause of syncope is uncertain.  Could have been vasovagal vs medication vs cardiac. Patient was on mexiletine for peripheral neuropathy - off label use.  This has been discontinued by cardiology. Appreciate cardiology consult--> Plans noted for event monitor at the time of discharge Exercise stress test outpatient after acute illness has resolved. Orthostatic vital signs--negative EEG--negative Previous MRI of the brain 09/23/2013 negative TSH 0.694 troponins negative 3   Fever likely viral syndrome as the patient's workup has been negative and she is clinically stable Blood cultures 2 sets--negative to date. Chest x-ray negative Urinalysis negative Throat culture for group A Streptococcus negative Respiratory viral panel--neg influenza PCR--negative remain off antibiotics as the patient is hemodynamically stable   Diabetes mellitus type 2 CBGs a little elevated inpatient will add lantus 5 units qhs to  SSI-s Discontinue metformin inpatient. Hemoglobin A1c--7.9 Patient can restart metformin after discharge   Hypertension -Continue amlodipine and propranolol -Relatively controlled  Family Communication:Patient alert and orientated. Disposition Plan: Home when medically stable   Procedures/Studies: Ct Head Wo Contrast  02/13/2014   CLINICAL DATA:  53 year old female with altered mental status  EXAM: CT HEAD WITHOUT CONTRAST  TECHNIQUE: Contiguous axial images were obtained from the base of the skull through the vertex without intravenous contrast.  COMPARISON:  08/20/2013  FINDINGS: No intracranial abnormalities are identified, including mass lesion or mass effect, hydrocephalus, extra-axial fluid collection, midline shift, hemorrhage, or acute infarction.  The visualized bony calvarium is unremarkable.  IMPRESSION: Unremarkable noncontrast head CT.   Electronically Signed   By: Hassan Rowan M.D.   On: 02/13/2014 15:22   Dg Chest Portable 1 View  02/13/2014   CLINICAL DATA:  53 year old female with altered mental status  EXAM: PORTABLE CHEST - 1 VIEW  COMPARISON:  None.  FINDINGS: Cardiac and mediastinal contours are within normal limits. Inspiratory volumes are low. No focal airspace consolidation, pleural effusion, pneumothorax or evidence of pulmonary edema. No acute osseous abnormality.  IMPRESSION: Low inspiratory volumes without evidence of acute cardiopulmonary process.   Electronically Signed   By: Jacqulynn Cadet M.D.   On: 02/13/2014 14:36   Dg Abd Portable 1v  02/15/2014   CLINICAL DATA:  52 year old female with mid abdominal pain radiating to the right shoulder and neck. Initial encounter.  EXAM: PORTABLE ABDOMEN - 1 VIEW  COMPARISON:  CT Abdomen and Pelvis 05/28/2012.  FINDINGS: Portable AP supine view at 0653 hrs. Non obstructed bowel gas pattern. Mild to moderate volume of retained stool throughout the colon. Negative lung bases. No definite pneumoperitoneum on this supine  view. No acute osseous abnormality identified.  IMPRESSION: Negative supine view of the abdomen.   Electronically Signed   By: Lars Pinks M.D.   On: 02/15/2014 07:15        Subjective: Complains of decreased CP with deep breathing.  Feels weak, but is making herself give up and walk.  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  Has a healing sore on her lip.  Objective: Filed Vitals:   02/15/14 1542 02/15/14 2239 02/16/14 0500 02/16/14 0652  BP: 148/86 90/51 136/78 136/78  Pulse: 59 57 65   Temp: 98.1 F (36.7 C) 97.8 F (36.6 C) 98.3 F (36.8 C) 98.2 F (36.8 C)  TempSrc: Oral Oral Oral Oral  Resp: 16 18 18 18   Height:      Weight:      SpO2: 100% 100% 100% 100%    Intake/Output Summary (Last 24 hours) at 02/16/14 1330 Last data filed at 02/16/14 0919  Gross per 24 hour  Intake    360 ml  Output      0 ml  Net    360 ml   Weight change:  Exam:   General:  Pt is alert, follows commands appropriately, not in acute distress.  In the process of making her bed.  HEENT: No icterus, No thrush, No meningismus,  [place]/AT  Cardiovascular: RRR, S1/S2, no rubs, no gallops  Respiratory: CTA bilaterally, no wheezing, no crackles, no rhonchi.  Mild left thorax pain with inspiration.  Abdomen: Soft/+BS, non tender, non distended, no guarding  Extremities: No edema, No lymphangitis, No petechiae, No rashes, no synovitis  Data Reviewed: Basic Metabolic Panel:  Recent Labs Lab 02/13/14 1334 02/14/14 0728 02/15/14 0534  NA 135* 141 138  K 4.5 4.1 4.3  CL 97 99 97  CO2 26 26 28   GLUCOSE 247* 148* 181*  BUN 12 8 5*  CREATININE 0.77 0.86 0.86  CALCIUM 8.9 9.1 9.3   Liver Function Tests:  Recent Labs Lab 02/13/14 1334  AST 23  ALT 24  ALKPHOS 124*  BILITOT 0.3  PROT 6.8  ALBUMIN 2.9*   CBC:  Recent Labs Lab 02/13/14 2129 02/14/14 0728 02/15/14 0534  WBC 7.5 8.3 6.3  NEUTROABS 5.2  --   --   HGB 12.0 11.5* 10.6*  HCT 37.3 35.3* 32.5*  MCV 89.2  87.4 85.5  PLT 259 223 259   Cardiac Enzymes:  Recent Labs Lab 02/14/14 0019 02/14/14 0638 02/14/14 2248  TROPONINI <0.30 <0.30 <0.30   BNP: Invalid input(s): POCBNP CBG:  Recent Labs Lab 02/15/14 1247 02/15/14 1735 02/15/14 2249 02/16/14 0746 02/16/14 1208  GLUCAP 196* 112* 330* 172* 212*    Recent Results (from the past 240 hour(s))  Rapid strep screen     Status: None   Collection Time: 02/13/14  5:56 PM  Result Value Ref Range Status   Streptococcus, Group A Screen (Direct) NEGATIVE NEGATIVE Final    Comment: (NOTE) A Rapid Antigen test may result negative if the antigen level in the sample is below the detection level of this test. The FDA has not cleared this test as a stand-alone test therefore the rapid antigen negative result has reflexed to a Group A Strep culture.   Culture, Group A Strep     Status: None   Collection Time: 02/13/14  5:56 PM  Result Value Ref Range Status   Specimen Description THROAT  Final   Special Requests NONE  Final   Culture   Final    No Beta  Hemolytic Streptococci Isolated Performed at [facility]    Report Status 02/15/2014 FINAL  Final  Respiratory virus panel (routine influenza)     Status: None   Collection Time: 02/13/14 11:50 PM  Result Value Ref Range Status   Source - RVPAN NASAL SWAB  Corrected    Comment: CORRECTED ON 11/16 AT 2328: PREVIOUSLY REPORTED AS NASAL SWAB   Respiratory Syncytial Virus A NOT DETECTED  Final   Respiratory Syncytial Virus B NOT DETECTED  Final   Influenza A NOT DETECTED  Final   Influenza B NOT DETECTED  Final   Parainfluenza 1 NOT DETECTED  Final   Parainfluenza 2 NOT DETECTED  Final   Parainfluenza 3 NOT DETECTED  Final   Metapneumovirus NOT DETECTED  Final   Rhinovirus NOT DETECTED  Final   Adenovirus NOT DETECTED  Final   Influenza A H1 NOT DETECTED  Final   Influenza A H3 NOT DETECTED  Final    Comment: (NOTE)       Normal Reference Range for each Analyte: NOT  DETECTED Testing performed using the Luminex xTAG Respiratory Viral Panel test kit. The analytical performance characteristics of this assay have been determined by [facility].  The modifications have not been cleared or approved by the FDA. This assay has been validated pursuant to the CLIA regulations and is used for clinical purposes. Performed at [facility], blood (routine x 2)     Status: None (Preliminary result)   Collection Time: 02/14/14 10:48 PM  Result Value Ref Range Status   Specimen Description BLOOD RIGHT FOOT  Final   Special Requests BOTTLES DRAWN AEROBIC ONLY 10CC  Final   Culture  Setup Time   Final    02/15/2014 13:06 Performed at [facility]    Culture   Final           BLOOD CULTURE RECEIVED NO GROWTH TO DATE CULTURE WILL BE HELD FOR 5 DAYS BEFORE ISSUING A FINAL NEGATIVE REPORT Performed at [facility]    Report Status PENDING  Incomplete  Culture, blood (routine x 2)     Status: None (Preliminary result)   Collection Time: 02/14/14 10:52 PM  Result Value Ref Range Status   Specimen Description BLOOD RIGHT FOOT  Final   Special Requests BOTTLES DRAWN AEROBIC ONLY 10CC  Final   Culture  Setup Time   Final    02/15/2014 13:06 Performed at [facility]    Culture   Final           BLOOD CULTURE RECEIVED NO GROWTH TO DATE CULTURE WILL BE HELD FOR 5 DAYS BEFORE ISSUING A FINAL NEGATIVE REPORT Performed at [facility]    Report Status PENDING  Incomplete     Scheduled Meds: . amLODipine  5 mg Oral Daily  . aspirin EC  81 mg Oral q morning - 10a  . carbamazepine  100 mg Oral TID  . DULoxetine  30 mg Oral Daily  . enoxaparin (LOVENOX) injection  40 mg Subcutaneous Q24H  . gabapentin  800 mg Oral TID  . insulin aspart  0-5 Units Subcutaneous QHS  . insulin aspart  0-9 Units Subcutaneous TID WC  . mexiletine  150 mg Oral TID  . multivitamin with minerals  1 tablet Oral Daily  . pravastatin   10 mg Oral QHS  . propranolol  60 mg Oral BID  . sodium chloride  3 mL Intravenous Q12H  . sodium  chloride  3 mL Intravenous Q12H   Continuous Infusions:    Ruthine Dose [facility] Pager [phone]  If 7PM-7AM, please contact night-coverage [email or web address] Password [email or web address] 02/16/2014, 1:30 PM   LOS: 3 days

## 2014-02-16 NOTE — Plan of Care (Signed)
Problem: Phase III Progression Outcomes Goal: Pain controlled on oral analgesia Outcome: Completed/Met Date Met:  02/16/14

## 2014-02-16 NOTE — Plan of Care (Signed)
Problem: Phase III Progression Outcomes Goal: Foley discontinued Outcome: Not Applicable Date Met:  53/97/14

## 2014-02-16 NOTE — Plan of Care (Signed)
Problem: Phase I Progression Outcomes Goal: Initial discharge plan identified Outcome: Progressing     

## 2014-02-16 NOTE — Plan of Care (Signed)
Problem: Phase III Progression Outcomes Goal: Activity at appropriate level-compared to baseline (UP IN CHAIR FOR HEMODIALYSIS)  Outcome: Progressing     

## 2014-02-16 NOTE — Progress Notes (Signed)
UR completed 

## 2014-02-16 NOTE — Plan of Care (Signed)
Problem: Phase III Progression Outcomes Goal: Activity at appropriate level-compared to baseline (UP IN CHAIR FOR HEMODIALYSIS)  Outcome: Completed/Met Date Met:  02/16/14     

## 2014-02-16 NOTE — Progress Notes (Signed)
Patient Name: Nichole Garza Date of Encounter: 02/16/2014     Active Problems:   Diabetes mellitus   Fibromyalgia   Altered mental status   Sore throat   Acute encephalopathy   Atypical chest pain   Fever presenting with conditions classified elsewhere   Transient alteration of awareness   Diabetes mellitus type 2, controlled    SUBJECTIVE No more chest pain. Had some back pain last night that has now resolved. Thinks she is going home either today or tomorrow   CURRENT MEDS . amLODipine  5 mg Oral Daily  . aspirin EC  81 mg Oral q morning - 10a  . carbamazepine  100 mg Oral TID  . DULoxetine  30 mg Oral Daily  . enoxaparin (LOVENOX) injection  40 mg Subcutaneous Q24H  . gabapentin  800 mg Oral TID  . insulin aspart  0-5 Units Subcutaneous QHS  . insulin aspart  0-9 Units Subcutaneous TID WC  . mexiletine  150 mg Oral TID  . multivitamin with minerals  1 tablet Oral Daily  . pravastatin  10 mg Oral QHS  . propranolol  60 mg Oral BID  . sodium chloride  3 mL Intravenous Q12H  . sodium chloride  3 mL Intravenous Q12H    OBJECTIVE  Filed Vitals:   02/15/14 1542 02/15/14 2239 02/16/14 0500 02/16/14 0652  BP: 148/86 90/51 136/78 136/78  Pulse: 59 57 65   Temp: 98.1 F (36.7 C) 97.8 F (36.6 C) 98.3 F (36.8 C) 98.2 F (36.8 C)  TempSrc: Oral Oral Oral Oral  Resp: 16 18 18 18   Height:      Weight:      SpO2: 100% 100% 100% 100%    Intake/Output Summary (Last 24 hours) at 02/16/14 0834 Last data filed at 02/16/14 0600  Gross per 24 hour  Intake    360 ml  Output      0 ml  Net    360 ml   Filed Weights   02/14/14 0039  Weight: 110 lb 3.7 oz (50 kg)    PHYSICAL EXAM  General: Pleasant, NAD. Neuro: Alert and oriented X 3. Moves all extremities spontaneously. Psych: Normal affect. HEENT:  Normal  Neck: Supple without bruits or JVD. Lungs:  Resp regular and unlabored, CTA. Heart: RRR no s3, s4, or murmurs. Loud second heart sound Abdomen:  Soft, non-tender, non-distended, BS + x 4.  Extremities: No clubbing, cyanosis or edema. DP/PT/Radials 2+ and equal bilaterally.  Accessory Clinical Findings  CBC  Recent Labs  02/13/14 2129 02/14/14 0728 02/15/14 0534  WBC 7.5 8.3 6.3  NEUTROABS 5.2  --   --   HGB 12.0 11.5* 10.6*  HCT 37.3 35.3* 32.5*  MCV 89.2 87.4 85.5  PLT 259 223 916   Basic Metabolic Panel  Recent Labs  02/14/14 0728 02/15/14 0534  NA 141 138  K 4.1 4.3  CL 99 97  CO2 26 28  GLUCOSE 148* 181*  BUN 8 5*  CREATININE 0.86 0.86  CALCIUM 9.1 9.3   Liver Function Tests  Recent Labs  02/13/14 1334  AST 23  ALT 24  ALKPHOS 124*  BILITOT 0.3  PROT 6.8  ALBUMIN 2.9*   Cardiac Enzymes  Recent Labs  02/14/14 0019 02/14/14 0638 02/14/14 2248  TROPONINI <0.30 <0.30 <0.30   Hemoglobin A1C  Recent Labs  02/14/14 2248  HGBA1C 7.9*    Thyroid Function Tests  Recent Labs  02/13/14 1334  TSH 0.694    TELE  NSR  Radiology/Studies  Ct Head Wo Contrast  02/13/2014   CLINICAL DATA:  53 year old female with altered mental status  EXAM: CT HEAD WITHOUT CONTRAST  TECHNIQUE: Contiguous axial images were obtained from the base of the skull through the vertex without intravenous contrast.  COMPARISON:  08/20/2013  FINDINGS: No intracranial abnormalities are identified, including mass lesion or mass effect, hydrocephalus, extra-axial fluid collection, midline shift, hemorrhage, or acute infarction.  The visualized bony calvarium is unremarkable.  IMPRESSION: Unremarkable noncontrast head CT.   Electronically Signed   By: Hassan Rowan M.D.   On: 02/13/2014 15:22   Dg Chest Portable 1 View  02/13/2014   CLINICAL DATA:  53 year old female with altered mental status  EXAM: PORTABLE CHEST - 1 VIEW  COMPARISON:  None.  FINDINGS: Cardiac and mediastinal contours are within normal limits. Inspiratory volumes are low. No focal airspace consolidation, pleural effusion, pneumothorax or evidence of  pulmonary edema. No acute osseous abnormality.  IMPRESSION: Low inspiratory volumes without evidence of acute cardiopulmonary process.   Electronically Signed   By: Jacqulynn Cadet M.D.   On: 02/13/2014 14:36   Dg Abd Portable 1v  02/15/2014   CLINICAL DATA:  53 year old female with mid abdominal pain radiating to the right shoulder and neck. Initial encounter.  EXAM: PORTABLE ABDOMEN - 1 VIEW  COMPARISON:  CT Abdomen and Pelvis 05/28/2012.  FINDINGS: Portable AP supine view at 0653 hrs. Non obstructed bowel gas pattern. Mild to moderate volume of retained stool throughout the colon. Negative lung bases. No definite pneumoperitoneum on this supine view. No acute osseous abnormality identified.  IMPRESSION: Negative supine view of the abdomen.   Electronically Signed   By: Lars Pinks M.D.   On: 02/15/2014 07:15     2D ECHO: 02/15/2014 LV EF: 55% -  60% Study Conclusions - Left ventricle: The cavity size was normal. Systolic function was normal. The estimated ejection fraction was in the range of 55% to 60%. Wall motion was normal; there were no regional wall motion abnormalities. - Mitral valve: There was mild regurgitation. - Tricuspid valve: There was trivial regurgitation.   ASSESSMENT AND PLAN  Anajah Garza is a 53 y.o. female with a history of DM with peripheral neuropathy on mexilitine, HTN and fibromyalgia who was admitted to [facility] on 02/13/14 after a syncopal event that occurred during an evaluation for chest pain at an urgent care. She was also noted to be febrile to 103 deg F.  Syncope - concern for arrhythmia is warranted due to the abrupt nature of the event, but on the other hand she had other events that were probably vasovagal.  -- EEG normal per neuro -- Placed on tele with no events noted. Will discharge with 30 day event monitor recommended. She can pick up her heart monitor at the [address] office at 11am on Friday 02/18/14 -- D/C mexiletine temporarily  as Mexiletine rarely causes syncope (<1% risk of syncope due to bradycardia and/or hypotension). She may be able to restart it if no arrhythmia is identified per Dr. Loletha Grayer  Chest pain -Musculoskeletal pain in neck and upper back as well as chest.  -- Troponin neg and ECG with no acute ST changes.  -- 2D ECHO with EF 55-60% and no RWMAs. Mild MR and trivial TR  -- ETT stress test recommended after resolution of fever and feeling a little better  Fever- now resolved. No source identified.  -- patient still with general malaise  DM- HgA1c 7.9 -- Discontinued mexilitine for  peripheral neuropathy.   HTN- continue amlodipine  HLD- continue statin    Signed, Eileen Stanford PA-C  Pager [phone]  I have examined the patient and reviewed assessment and plan and discussed with patient.  Agree with above as stated.  Continues to have some mild chest pain with movement to certain positions.  I do not think this is ischemic pain.  F/u with Dr. Sallyanne Kuster.    VARANASI,JAYADEEP S.

## 2014-02-16 NOTE — Plan of Care (Signed)
Problem: Phase III Progression Outcomes Goal: Voiding independently Outcome: Completed/Met Date Met:  02/16/14

## 2014-02-17 DIAGNOSIS — R55 Syncope and collapse: Secondary | ICD-10-CM | POA: Insufficient documentation

## 2014-02-17 LAB — GLUCOSE, CAPILLARY
Glucose-Capillary: 167 mg/dL — ABNORMAL HIGH (ref 70–99)
Glucose-Capillary: 174 mg/dL — ABNORMAL HIGH (ref 70–99)
Glucose-Capillary: 229 mg/dL — ABNORMAL HIGH (ref 70–99)
Glucose-Capillary: 289 mg/dL — ABNORMAL HIGH (ref 70–99)

## 2014-02-17 MED ORDER — FLEET ENEMA 7-19 GM/118ML RE ENEM
1.0000 | ENEMA | Freq: Once | RECTAL | Status: AC
  Administered 2014-02-17: 1 via RECTAL
  Filled 2014-02-17: qty 1

## 2014-02-17 NOTE — Discharge Summary (Signed)
Physician Discharge Summary  Nichole Garza VVO:[id number] DOB: 1960-12-13 DOA: 02/13/2014  PCP: Benito Mccreedy, MD  Admit date: 02/13/2014 Discharge date: 02/17/2014  Time spent: 45 minutes  Recommendations for Outpatient Follow-up:  1. Mexiletine has been discontinued.  Norvasc has been discontinued 2. PCP please monitor for orthostatic hypotension 3. Patient to pick up heart monitor on Friday 11/20 at [place] (11:00 am on [facility]) 4. Outpatient stress test. 5. CBC, BMET at PCP follow up.  Patient has normocytic anemia.  Discharge Diagnoses:  Principal Problem:   Atypical chest pain Active Problems:   Diabetes mellitus   Fibromyalgia   Sore throat   Acute encephalopathy   Fever presenting with conditions classified elsewhere   Transient alteration of awareness   Diabetes mellitus type 2, controlled   Discharge Condition: stable.  Diet recommendation: heart healthy   History of present illness:  53 year old female with a history of fibromyalgia, diabetes mellitus, hypertension, hyperlipidemia presented with chest discomfort, sore throat for one day. The patient went to urgent care for the chest discomfort and sore throat. The patient was given a GI cocktail after which she became unresponsive. The next thing she recalled was being in the emergency department. On the evening of 02/13/2014, the patient spiked a fever over 103.22F. She continued to have intermittent chest discomfort and shortness of breath but these have improved over the last 48 hours. She denies any bladder or bowel incontinence.  The patient has remained off antibiotics as she has remained hemodynamically stable.  Hospital Course:   Chest pain with episode of unresponsiveness Chest discomfort appears musculoskeletal. Worse with movement and inspiration. Cause of unresponsiveness is uncertain. Could have been vasovagal vs medication vs cardiac. Patient was on mexiletine for  peripheral neuropathy - off label use. This has been discontinued by cardiology. Appreciate cardiology consult--> Plans noted for event monitor at the time of discharge Exercise stress test outpatient after acute illness has resolved. Orthostatic vital signs--negative as an inpatient.  We request they be rechecked outpatient. EEG--negative.  Previous MRI of the brain 09/23/2013 negative.  TSH 0.694.  Troponins negative 3   Fever likely viral syndrome as the patient's workup has been negative and she is clinically stable Blood cultures 2 sets--negative to date.  Chest x-ray negative.  Urinalysis negative Throat culture for group A Streptococcus negative.  Respiratory viral panel--neg.  influenza PCR--negative Remained off antibiotics as the patient is hemodynamically stable.   Diabetes mellitus type 2 Hemoglobin A1c--7.9 Patient to restart metformin after discharge   Hypertension Relatively controlled inpatient. Will discharge on Losartan and propranolol.  D/C norvasc as it may cause orthostatic hypotension.  Normocytic Anemia No melena, or gross bleeding Stable for outpatient work up.    Procedures: Study Conclusions - Left ventricle: The cavity size was normal. Systolic function was normal. The estimated ejection fraction was in the range of 55%to 60%. Wall motion was normal; there were no regional wall motion abnormalities. - Mitral valve: There was mild regurgitation.  - Tricuspid valve: There was trivial regurgitation  Consultations:  Cardiology  Discharge Exam:  Filed Vitals:   02/16/14 0652 02/16/14 1351 02/16/14 2226 02/17/14 0550  BP: 136/78 125/72 144/85 133/71  Pulse:  62 56 60  Temp: 98.2 F (36.8 C) 98.3 F (36.8 C) 97.6 F (36.4 C) 98 F (36.7 C)  TempSrc: Oral Oral Oral Oral  Resp: _0 Height:      Weight:      SpO2: 100% 99% 100% 100%  Filed Weights   02/14/14 0039  Weight: 50 kg (110 lb 3.7 oz)    General: Pt is sleeping, but  easily awakens.  A&O, NAD.    HEENT: No icterus, No thrush, No meningismus, [place]/AT  Cardiovascular: RRR, S1/S2, no rubs, no gallops  Respiratory: CTA bilaterally, no wheezing, no crackles, no rhonchi. Mild left thorax pain with inspiration & stretching.  Abdomen: Soft/+BS, non tender, non distended, no guarding  Extremities: No edema, able to ambulate around the room without difficulty.   Discharge Instructions   Discharge Instructions    Diet - low sodium heart healthy    Complete by:  As directed      Increase activity slowly    Complete by:  As directed           Current Discharge Medication List    CONTINUE these medications which have NOT CHANGED   Details  Alpha-Lipoic Acid 100 MG CAPS Take 100 mg by mouth daily.    aspirin EC 81 MG tablet Take 81 mg by mouth every morning.    carbamazepine (TEGRETOL) 100 MG chewable tablet Chew 100 mg by mouth 3 (three) times daily. Refills: 3    DULoxetine (CYMBALTA) 30 MG capsule Take 30 mg by mouth daily.    fish oil-omega-3 fatty acids 1000 MG capsule Take 1 g by mouth 3 (three) times daily.     gabapentin (NEURONTIN) 800 MG tablet Take 800 mg by mouth 3 (three) times daily.    GINKGO BILOBA COMPLEX PO Take 1 capsule by mouth daily.     losartan (COZAAR) 50 MG tablet Take 50 mg by mouth daily.    metFORMIN (GLUCOPHAGE) 500 MG tablet Take 500 mg by mouth 2 (two) times daily with a meal.    Multiple Vitamin (MULTIVITAMIN WITH MINERALS) TABS Take 1 tablet by mouth daily.    pravastatin (PRAVACHOL) 10 MG tablet Take 10 mg by mouth at bedtime.    propranolol (INDERAL) 60 MG tablet Take 60 mg by mouth 2 (two) times daily.    traMADol (ULTRAM) 50 MG tablet Take 50 mg by mouth every 6 (six) hours as needed. For pain    vitamin C (ASCORBIC ACID) 500 MG tablet Take 500 mg by mouth daily.      STOP taking these medications     amLODipine (NORVASC) 5 MG tablet      mexiletine (MEXITIL) 150 MG capsule       HYDROcodone-acetaminophen (NORCO/VICODIN) 5-325 MG per tablet        Allergies  Allergen Reactions  . Enalapril Anaphylaxis  . Hydrocodone     Does not remember what happened-she woke up in her kitchen  . Latex Swelling    Lips and face  . Relpax [Eletriptan] Other (See Comments)    "fainting"-knocked her out for the whole day   Follow-up Information    Follow up with [facility] 02[facility]/2014.   Why:  [address]: please pick your heart monitor at 11am    Contact information:   [place] [address] [phone]      Follow up with Sanda Klein, MD.   Specialty:  Cardiology   Why:  The office will call you to make an appoinment., If you do not hear from them, please contact them., You should be seen within 1-2 weeks.   Contact information:   [address] [place] [address] [phone]       Follow up with  OSEI-BONSU,GEORGE, MD In 2 weeks.   Specialty:  Internal Medicine   Contact information:   [address] [place] [address] [phone]        The results of significant diagnostics from this hospitalization (including imaging, microbiology, ancillary and laboratory) are listed below for reference.    Significant Diagnostic Studies: Ct Head Wo Contrast  02/13/2014   CLINICAL DATA:  53-year-old female with altered mental status  EXAM: CT HEAD WITHOUT CONTRAST  TECHNIQUE: Contiguous axial images were obtained from the base of the skull through the vertex without intravenous contrast.  COMPARISON:  08/20/2013  FINDINGS: No intracranial abnormalities are identified, including mass lesion or mass effect, hydrocephalus, extra-axial fluid collection, midline shift, hemorrhage, or acute infarction.  The visualized bony calvarium is unremarkable.  IMPRESSION: Unremarkable noncontrast head CT.   Electronically Signed   By: Jeff  Hu M.D.   On: 02/13/2014  15:22   Dg Chest Portable 1 View  02/13/2014   CLINICAL DATA:  53-year-old female with altered mental status  EXAM: PORTABLE CHEST - 1 VIEW  COMPARISON:  None.  FINDINGS: Cardiac and mediastinal contours are within normal limits. Inspiratory volumes are low. No focal airspace consolidation, pleural effusion, pneumothorax or evidence of pulmonary edema. No acute osseous abnormality.  IMPRESSION: Low inspiratory volumes without evidence of acute cardiopulmonary process.   Electronically Signed   By: Heath  McCullough M.D.   On: 02/13/2014 14:36   Dg Abd Portable 1v  02/15/2014   CLINICAL DATA:  53-year-old female with mid abdominal pain radiating to the right shoulder and neck. Initial encounter.  EXAM: PORTABLE ABDOMEN - 1 VIEW  COMPARISON:  CT Abdomen and Pelvis 05/28/2012.  FINDINGS: Portable AP supine view at 0653 hrs. Non obstructed bowel gas pattern. Mild to moderate volume of retained stool throughout the colon. Negative lung bases. No definite pneumoperitoneum on this supine view. No acute osseous abnormality identified.  IMPRESSION: Negative supine view of the abdomen.   Electronically Signed   By: Lee  Hall M.D.   On: 02/15/2014 07:15    Microbiology: Recent Results (from the past 240 hour(s))  Rapid strep screen     Status: None   Collection Time: 02/13/14  5:56 PM  Result Value Ref Range Status   Streptococcus, Group A Screen (Direct) NEGATIVE NEGATIVE Final    Comment: (NOTE) A Rapid Antigen test may result negative if the antigen level in the sample is below the detection level of this test. The FDA has not cleared this test as a stand-alone test therefore the rapid antigen negative result has reflexed to a Group A Strep culture.   Culture, Group A Strep     Status: None   Collection Time: 02/13/14  5:56 PM  Result Value Ref Range Status   Specimen Description THROAT  Final   Special Requests NONE  Final   Culture   Final    No Beta Hemolytic Streptococci Isolated Performed  at [facility]    Report Status 02/15/2014 FINAL  Final  Respiratory virus panel (routine influenza)     Status: None   Collection Time: 02/13/14 11:50 PM  Result Value Ref Range Status   Source - RVPAN NASAL SWAB  Corrected    Comment: CORRECTED ON 11/16 AT 2328: PREVIOUSLY REPORTED AS NASAL SWAB   Respiratory Syncytial Virus A NOT DETECTED  Final   Respiratory Syncytial Virus B NOT DETECTED  Final   Influenza A NOT DETECTED  Final   Influenza B NOT DETECTED    Final   Parainfluenza 1 NOT DETECTED  Final   Parainfluenza 2 NOT DETECTED  Final   Parainfluenza 3 NOT DETECTED  Final   Metapneumovirus NOT DETECTED  Final   Rhinovirus NOT DETECTED  Final   Adenovirus NOT DETECTED  Final   Influenza A H1 NOT DETECTED  Final   Influenza A H3 NOT DETECTED  Final    Comment: (NOTE)       Normal Reference Range for each Analyte: NOT DETECTED Testing performed using the Luminex xTAG Respiratory Viral Panel test kit. The analytical performance characteristics of this assay have been determined by [facility].  The modifications have not been cleared or approved by the FDA. This assay has been validated pursuant to the CLIA regulations and is used for clinical purposes. Performed at [facility], blood (routine x 2)     Status: None (Preliminary result)   Collection Time: 02/14/14 10:48 PM  Result Value Ref Range Status   Specimen Description BLOOD RIGHT FOOT  Final   Special Requests BOTTLES DRAWN AEROBIC ONLY 10CC  Final   Culture  Setup Time   Final    02/15/2014 13:06 Performed at [facility]    Culture   Final           BLOOD CULTURE RECEIVED NO GROWTH TO DATE CULTURE WILL BE HELD FOR 5 DAYS BEFORE ISSUING A FINAL NEGATIVE REPORT Performed at [facility]    Report Status PENDING  Incomplete  Culture, blood (routine x 2)     Status: None (Preliminary result)   Collection Time: 02/14/14 10:52 PM  Result Value Ref Range Status    Specimen Description BLOOD RIGHT FOOT  Final   Special Requests BOTTLES DRAWN AEROBIC ONLY 10CC  Final   Culture  Setup Time   Final    02/15/2014 13:06 Performed at [facility]    Culture   Final           BLOOD CULTURE RECEIVED NO GROWTH TO DATE CULTURE WILL BE HELD FOR 5 DAYS BEFORE ISSUING A FINAL NEGATIVE REPORT Performed at [facility]    Report Status PENDING  Incomplete     Labs: Basic Metabolic Panel:  Recent Labs Lab 02/13/14 1334 02/14/14 0728 02/15/14 0534  NA 135* 141 138  K 4.5 4.1 4.3  CL 97 99 97  CO2 _0 GLUCOSE 247* 148* 181*  BUN 12 8 5*  CREATININE 0.77 0.86 0.86  CALCIUM 8.9 9.1 9.3   Liver Function Tests:  Recent Labs Lab 02/13/14 1334  AST 23  ALT 24  ALKPHOS 124*  BILITOT 0.3  PROT 6.8  ALBUMIN 2.9*   CBC:  Recent Labs Lab 02/13/14 2129 02/14/14 0728 02/15/14 0534  WBC 7.5 8.3 6.3  NEUTROABS 5.2  --   --   HGB 12.0 11.5* 10.6*  HCT 37.3 35.3* 32.5*  MCV 89.2 87.4 85.5  PLT 259 223 259   Cardiac Enzymes:  Recent Labs Lab 02/14/14 0019 02/14/14 0638 02/14/14 2248  TROPONINI <0.30 <0.30 <0.30   CBG:  Recent Labs Lab 02/16/14 1208 02/16/14 1657 02/16/14 2223 02/17/14 0028 02/17/14 0801  GLUCAP 212* 155* 167* 289* 174*       Signed:  Imogene Burn, PA-C  [facility] 02/17/2014, 10:32 AM

## 2014-02-17 NOTE — Discharge Instructions (Signed)
Please stop taking Mexileline for now.   Norvasc / Amlodipine has been discontinued.  Please have your primary care physician monitor your blood pressure.  Your chest pain is musculoskeletal in nature.  Please apply heat or ice (which ever helps).    Pick up your heart monitor on Friday 11/20 at 11:00 at [place] on [facility].

## 2014-02-18 ENCOUNTER — Encounter: Payer: Self-pay | Admitting: Radiology

## 2014-02-18 ENCOUNTER — Encounter (INDEPENDENT_AMBULATORY_CARE_PROVIDER_SITE_OTHER): Payer: 59

## 2014-02-18 DIAGNOSIS — R55 Syncope and collapse: Secondary | ICD-10-CM

## 2014-02-18 DIAGNOSIS — R002 Palpitations: Secondary | ICD-10-CM

## 2014-02-18 NOTE — Progress Notes (Signed)
[employer] 30 day monitor applied. EOS 03-20-14

## 2014-02-21 ENCOUNTER — Telehealth: Payer: Self-pay | Admitting: Cardiovascular Disease

## 2014-02-21 LAB — CULTURE, BLOOD (ROUTINE X 2)
Culture: NO GROWTH
Culture: NO GROWTH

## 2014-02-22 NOTE — Telephone Encounter (Signed)
Closed encounter °

## 2014-03-30 ENCOUNTER — Telehealth: Payer: Self-pay | Admitting: *Deleted

## 2014-03-30 NOTE — Telephone Encounter (Signed)
Normal Event monitor results called to patient.  Voiced understanding.

## 2014-04-13 ENCOUNTER — Encounter: Payer: Self-pay | Admitting: Cardiovascular Disease

## 2014-04-13 ENCOUNTER — Ambulatory Visit (INDEPENDENT_AMBULATORY_CARE_PROVIDER_SITE_OTHER): Payer: 59 | Admitting: Cardiovascular Disease

## 2014-04-13 VITALS — BP 132/80 | HR 63 | Ht 60.0 in | Wt 111.4 lb

## 2014-04-13 DIAGNOSIS — R0789 Other chest pain: Secondary | ICD-10-CM

## 2014-04-13 DIAGNOSIS — R55 Syncope and collapse: Secondary | ICD-10-CM

## 2014-04-13 NOTE — Patient Instructions (Signed)
Dr. Sallyanne Kuster recommends that you schedule a follow-up appointment in: as needed.

## 2014-04-18 NOTE — Progress Notes (Signed)
Patient ID: Nichole Garza, female   DOB: [birth year], 54 y.o.   MRN: [id number]      Reason for office visit Follow up event monitor/syncope  This is a 54 y.o. perimenopausal female with a past medical history significant for 7-years of well controlled DM with severe peripheral neuropathy, HTN and fibromyalgia, admitted after a syncopal event that occurred during evaluation for chest pain in Urgent Care last November. The syncope occurred immediately following a GI cocktail. She states that she has never experienced syncope before, but her chart documents fainting spells after taking hydrocodone and Relpax in the past. Echo and carotid Doppler showed normal findings. No arrhythmia was recorded during her hospitalization or a 30 day event monitor in Nov-Dec. The chest pain has not recurred. She takes propranolol for migraine prevention.  Allergies  Allergen Reactions  . Enalapril Anaphylaxis  . Hydrocodone     Does not remember what happened-she woke up in her kitchen  . Latex Swelling    Lips and face  . Relpax [Eletriptan] Other (See Comments)    "fainting"-knocked her out for the whole day    Current Outpatient Prescriptions  Medication Sig Dispense Refill  . aspirin EC 81 MG tablet Take 81 mg by mouth every morning.    . DULoxetine (CYMBALTA) 30 MG capsule Take 30 mg by mouth daily.    . fish oil-omega-3 fatty acids 1000 MG capsule Take 1 g by mouth 3 (three) times daily.     Marland Kitchen gabapentin (NEURONTIN) 800 MG tablet Take 800 mg by mouth 3 (three) times daily.    Marland Kitchen GINKGO BILOBA COMPLEX PO Take 1 capsule by mouth daily.     Marland Kitchen losartan (COZAAR) 50 MG tablet Take 50 mg by mouth daily.    . metFORMIN (GLUCOPHAGE) 500 MG tablet Take 500 mg by mouth 2 (two) times daily with a meal.    . Multiple Vitamin (MULTIVITAMIN WITH MINERALS) TABS Take 1 tablet by mouth daily.    . pravastatin (PRAVACHOL) 10 MG tablet Take 10 mg by mouth at bedtime.    . propranolol (INDERAL) 60 MG tablet Take  60 mg by mouth 2 (two) times daily.    . traMADol (ULTRAM) 50 MG tablet Take 50 mg by mouth every 6 (six) hours as needed. For pain    . vitamin C (ASCORBIC ACID) 500 MG tablet Take 500 mg by mouth daily.    . Alpha-Lipoic Acid 100 MG CAPS Take 100 mg by mouth daily.     No current facility-administered medications for this visit.    Past Medical History  Diagnosis Date  . Diabetes mellitus   . Hypertension   . Nerve pain   . Fibromyalgia   . TIA (transient ischemic attack)   . Migraine headache   . Arthritis     in her spine  . Renal disorder     kidney stone    Past Surgical History  Procedure Laterality Date  . Cystoscopy with retrograde pyelogram, ureteroscopy and stent placement Right 05/29/2012    Procedure: [place], URETEROSCOPY AND STENT PLACEMENT;  Surgeon: Hanley Ben, MD;  Location: [facility];  Service: Urology;  Laterality: Right;  . Holmium laser application Right 09/28/4763    Procedure: HOLMIUM LASER APPLICATION;  Surgeon: Hanley Ben, MD;  Location: [facility];  Service: Urology;  Laterality: Right;  . Abdominal hysterectomy  2012    Family History  Problem Relation Age of Onset  . Diabetes Mother   . Diabetes Father   .  Cancer Brother     History   Social History  . Marital Status: Married    Spouse Name: Archie Endo    Number of Children: 1  . Years of Education: N/A   Occupational History  . [employer]   Social History Main Topics  . Smoking status: Never Smoker   . Smokeless tobacco: Never Used  . Alcohol Use: No  . Drug Use: No  . Sexual Activity: Yes    Birth Control/ Protection: Surgical   Other Topics Concern  . Not on file   Social History Narrative   Married.  Lives with husband.  Works as a Quarry manager.    Review of systems: The patient specifically denies any chest pain at rest or with exertion, dyspnea at rest or with exertion, orthopnea, paroxysmal nocturnal dyspnea, syncope,  palpitations, focal neurological deficits, intermittent claudication, lower extremity edema, unexplained weight gain, cough, hemoptysis or wheezing.  The patient also denies abdominal pain, nausea, vomiting, dysphagia, diarrhea, constipation, polyuria, polydipsia, dysuria, hematuria, frequency, urgency, abnormal bleeding or bruising, fever, chills, unexpected weight changes, mood swings, change in skin or hair texture, change in voice quality, auditory or visual problems, allergic reactions or rashes, new musculoskeletal complaints other than usual "aches and pains".   PHYSICAL EXAM BP 132/80 mmHg  Pulse 63  Ht 5' (1.524 m)  Wt 111 lb 6.4 oz (50.531 kg)  BMI 21.76 kg/m2  General: Alert, oriented x3, no distress Head: no evidence of trauma, PERRL, EOMI, no exophtalmos or lid lag, no myxedema, no xanthelasma; normal ears, nose and oropharynx Neck: normal jugular venous pulsations and no hepatojugular reflux; brisk carotid pulses without delay and no carotid bruits Chest: clear to auscultation, no signs of consolidation by percussion or palpation, normal fremitus, symmetrical and full respiratory excursions Cardiovascular: normal position and quality of the apical impulse, regular rhythm, normal first and second heart sounds, no murmurs, rubs or gallops Abdomen: no tenderness or distention, no masses by palpation, no abnormal pulsatility or arterial bruits, normal bowel sounds, no hepatosplenomegaly Extremities: no clubbing, cyanosis or edema; 2+ radial, ulnar and brachial pulses bilaterally; 2+ right femoral, posterior tibial and dorsalis pedis pulses; 2+ left femoral, posterior tibial and dorsalis pedis pulses; no subclavian or femoral bruits Neurological: grossly nonfocal   EKG: normal sinus rhythm, chronic nonspecific T wave changes  Lipid Panel     Component Value Date/Time   CHOL 182 10/20/2012 0320   TRIG 187* 10/20/2012 0320   HDL 66 10/20/2012 0320   CHOLHDL 2.8 10/20/2012 0320    VLDL 37 10/20/2012 0320   LDLCALC 79 10/20/2012 0320    BMET    Component Value Date/Time   NA 138 02/15/2014 0534   K 4.3 02/15/2014 0534   CL 97 02/15/2014 0534   CO2 28 02/15/2014 0534   GLUCOSE 181* 02/15/2014 0534   BUN 5* 02/15/2014 0534   CREATININE 0.86 02/15/2014 0534   CALCIUM 9.3 02/15/2014 0534   GFRNONAA 75* 02/15/2014 0534   GFRAA 87* 02/15/2014 0534     ASSESSMENT AND PLAN  No evidence of structural heart disease and no documented arrhythmia. Possible "vagal" event related to GI event. The syncope was atypical in that it associated a long period of amnesia. Discussed need for adequate hydration and awareness of triggers and prodromal symptoms. Asked to call us back for recurrent chest discomfort or presyncope/syncope. Consider implantable loop recorder.  Holli Humbles, MD, [place] [phone] office [phone] pager

## 2014-07-13 ENCOUNTER — Ambulatory Visit: Payer: Self-pay | Admitting: Pharmacist

## 2014-08-10 ENCOUNTER — Ambulatory Visit (INDEPENDENT_AMBULATORY_CARE_PROVIDER_SITE_OTHER): Payer: Self-pay | Admitting: Family Medicine

## 2014-08-10 ENCOUNTER — Ambulatory Visit: Payer: 59 | Admitting: Pharmacist

## 2014-08-10 VITALS — BP 136/74 | Ht 60.0 in | Wt 113.0 lb

## 2014-08-10 DIAGNOSIS — E119 Type 2 diabetes mellitus without complications: Secondary | ICD-10-CM

## 2014-08-10 NOTE — Progress Notes (Signed)
Subjective:  Patient is a 54 yo female with type 2 diabetes who presents today for re-enrollment into the employer-sponsored [employer] [facility] program. Current diabetes regimen includes Metformin. Patient also continues on daily ASA, ARB, and statin. Most recent MD follow-up was April 2016. Patient has a pending appt for July 2016, every 4 months. No major health changes at this time.  Patient continues being treated for fibromyalgia and has been prescribed carbemazepine, mexilitine, duloxetine, and gabapentin for fibromyalgia pain.    Diabetes Assessment:  No changes to diabetes regimen at this time. However, patient reports increasing her metformin to three times daily (vs twice daily) in response to elevated glucose.  Patient attempts to maintain good medication compliance but could use improvement in this area. Most recent A1c was 7.9% (Nov 2015) which is exceeding goal of less than 7%.  Weight has increased slightly since last visit (which was 2 years ago).  Patient did bring meter today and is currently testing 2-3 times per day.  Hypoglycemia is rare. Patient does demonstrate appropriate correction of hypoglycemia when needed.  Usual fasting glucose is averaging 200 or more. Patient reports signs and symptoms of neuropathy including numbness/tingling/burning in feet but attributes this to fibromyalgia vs neuropathy.  She is being treated with duloxetine and gabapentin.  Patient is up to date on eye and dental exam.      Lifestyle Assessment:  Diet - Patient reports diet remains much the same and portions are under control.  Food recall is as follows for previous day: Breakfast - applesauce, tea Lunch - taco and fruit Dinner - oatmeal No snacks - occassional cashew or peanut Beverage - ginger tea   Exercise - No longer exercising due to neuropathy and change in work schedule.  Would like to resume walking at work during her breaks and will attempt to walk her hall for 10 minutes twice daily  during her morning and afternoon break.   Plan and Goals: 1)  Continue walking as tolerated, 20 minutes per day while at work 2)  Continue making healthy dietary choices 3)  Continue testing blood glucose 4)  Follow-up with doctor in July 5)  Follow-up with [facility] on Tuesday August 16th @ 4:30 pm   Tilman Neat, PharmD [facility] [place]  [phone]

## 2014-08-10 NOTE — Patient Instructions (Addendum)
1)  Continue walking as tolerated, 20 minutes per day while at work 2)  Continue making healthy dietary choices 3)  Continue testing blood glucose 4)  Follow-up with doctor in July 5)  Follow-up with [facility] on Tuesday August 16th @ 4:30 pm  Great to see you today!

## 2014-08-25 NOTE — Progress Notes (Signed)
ATTENDING PHYSICIAN NOTE: I have reviewed the chart and agree with the plan as detailed above. Laloni Rowton MD Pager [phone]  

## 2014-11-15 ENCOUNTER — Ambulatory Visit: Payer: 59 | Admitting: Pharmacist

## 2014-11-15 ENCOUNTER — Ambulatory Visit: Payer: 59 | Admitting: Family Medicine

## 2015-01-03 ENCOUNTER — Ambulatory Visit: Payer: 59 | Admitting: Pharmacist

## 2015-03-27 ENCOUNTER — Emergency Department (HOSPITAL_BASED_OUTPATIENT_CLINIC_OR_DEPARTMENT_OTHER)
Admission: EM | Admit: 2015-03-27 | Discharge: 2015-03-27 | Disposition: A | Discharge status: Home / Self Care | Payer: 59 | Attending: Emergency Medicine | Admitting: Emergency Medicine

## 2015-03-27 ENCOUNTER — Encounter (HOSPITAL_BASED_OUTPATIENT_CLINIC_OR_DEPARTMENT_OTHER): Payer: Self-pay | Admitting: Emergency Medicine

## 2015-03-27 DIAGNOSIS — Z8742 Personal history of other diseases of the female genital tract: Secondary | ICD-10-CM | POA: Insufficient documentation

## 2015-03-27 DIAGNOSIS — M1990 Unspecified osteoarthritis, unspecified site: Secondary | ICD-10-CM | POA: Insufficient documentation

## 2015-03-27 DIAGNOSIS — Z79899 Other long term (current) drug therapy: Secondary | ICD-10-CM | POA: Insufficient documentation

## 2015-03-27 DIAGNOSIS — Z91040 Latex allergy status: Secondary | ICD-10-CM | POA: Insufficient documentation

## 2015-03-27 DIAGNOSIS — M797 Fibromyalgia: Secondary | ICD-10-CM | POA: Insufficient documentation

## 2015-03-27 DIAGNOSIS — E119 Type 2 diabetes mellitus without complications: Secondary | ICD-10-CM | POA: Insufficient documentation

## 2015-03-27 DIAGNOSIS — I10 Essential (primary) hypertension: Secondary | ICD-10-CM | POA: Insufficient documentation

## 2015-03-27 DIAGNOSIS — Z7984 Long term (current) use of oral hypoglycemic drugs: Secondary | ICD-10-CM | POA: Insufficient documentation

## 2015-03-27 DIAGNOSIS — Z87442 Personal history of urinary calculi: Secondary | ICD-10-CM | POA: Insufficient documentation

## 2015-03-27 DIAGNOSIS — R51 Headache: Secondary | ICD-10-CM | POA: Insufficient documentation

## 2015-03-27 DIAGNOSIS — Z7982 Long term (current) use of aspirin: Secondary | ICD-10-CM | POA: Insufficient documentation

## 2015-03-27 DIAGNOSIS — H53149 Visual discomfort, unspecified: Secondary | ICD-10-CM | POA: Insufficient documentation

## 2015-03-27 DIAGNOSIS — Z8673 Personal history of transient ischemic attack (TIA), and cerebral infarction without residual deficits: Secondary | ICD-10-CM | POA: Insufficient documentation

## 2015-03-27 DIAGNOSIS — R519 Headache, unspecified: Secondary | ICD-10-CM

## 2015-03-27 MED ORDER — ACETAMINOPHEN 500 MG PO TABS
1000.0000 mg | ORAL_TABLET | Freq: Once | ORAL | Status: AC
  Administered 2015-03-27: 1000 mg via ORAL
  Filled 2015-03-27: qty 2

## 2015-03-27 MED ORDER — DIPHENHYDRAMINE HCL 50 MG/ML IJ SOLN
25.0000 mg | Freq: Once | INTRAMUSCULAR | Status: AC
  Administered 2015-03-27: 25 mg via INTRAVENOUS
  Filled 2015-03-27: qty 1

## 2015-03-27 MED ORDER — KETOROLAC TROMETHAMINE 30 MG/ML IJ SOLN
15.0000 mg | Freq: Once | INTRAMUSCULAR | Status: AC
  Administered 2015-03-27: 15 mg via INTRAVENOUS
  Filled 2015-03-27: qty 1

## 2015-03-27 MED ORDER — METOCLOPRAMIDE HCL 5 MG/ML IJ SOLN
10.0000 mg | Freq: Once | INTRAMUSCULAR | Status: AC
  Administered 2015-03-27: 10 mg via INTRAVENOUS
  Filled 2015-03-27: qty 2

## 2015-03-27 MED ORDER — SODIUM CHLORIDE 0.9 % IV BOLUS (SEPSIS)
1000.0000 mL | Freq: Once | INTRAVENOUS | Status: AC
  Administered 2015-03-27: 1000 mL via INTRAVENOUS

## 2015-03-27 NOTE — ED Notes (Signed)
Pt states had onset of HA last week, states progressively worse, radiates to neck

## 2015-03-27 NOTE — ED Notes (Signed)
Pt describes midline occipital lobe headache that started last Sunday, states she has history of migraines but this is different, pt with lidocaine patch to right side of forehead. Pt with h/o fibromyalgia

## 2015-03-27 NOTE — Discharge Instructions (Signed)

## 2015-03-27 NOTE — ED Notes (Signed)
Pt took no meds this am, states that she has not been able to afford medications since October, currently seeing pain management for pain control of fibromyalgia

## 2015-03-27 NOTE — ED Notes (Signed)
Comfort measures provided, lights dimmed in room, family at side, callbell within reach.

## 2015-03-27 NOTE — ED Provider Notes (Signed)
CSN: [id number]     Arrival date & time 03/27/15  0805 History   First MD Initiated Contact with Patient 03/27/15 0840     Chief Complaint  Patient presents with  . Headache     (Consider location/radiation/quality/duration/timing/severity/associated sxs/prior Treatment) Patient is a 54 y.o. female presenting with headaches. The history is provided by the patient.  Headache Pain location:  L parietal and L temporal Quality:  Dull Radiates to:  Does not radiate Onset quality:  Gradual Duration:  1 week Timing:  Constant Progression:  Waxing and waning Chronicity:  Recurrent Similar to prior headaches: yes   Relieved by:  Nothing Worsened by:  Nothing Ineffective treatments:  None tried Associated symptoms: photophobia   Associated symptoms: no abdominal pain, no blurred vision, no fever, no focal weakness, no hearing loss, no loss of balance and no vomiting     Past Medical History  Diagnosis Date  . Diabetes mellitus   . Hypertension   . Nerve pain   . Fibromyalgia   . TIA (transient ischemic attack)   . Migraine headache   . Arthritis     in her spine  . Renal disorder     kidney stone   Past Surgical History  Procedure Laterality Date  . Cystoscopy with retrograde pyelogram, ureteroscopy and stent placement Right 05/29/2012    Procedure: [place], URETEROSCOPY AND STENT PLACEMENT;  Surgeon: Hanley Ben, MD;  Location: [facility];  Service: Urology;  Laterality: Right;  . Holmium laser application Right [id number]    Procedure: HOLMIUM LASER APPLICATION;  Surgeon: Hanley Ben, MD;  Location: [facility];  Service: Urology;  Laterality: Right;  . Abdominal hysterectomy  2012   Family History  Problem Relation Age of Onset  . Diabetes Mother   . Diabetes Father   . Cancer Brother    Social History  Substance Use Topics  . Smoking status: Never Smoker   . Smokeless tobacco: Never Used  . Alcohol Use: No   OB History    No data  available     Review of Systems  Constitutional: Negative for fever.  HENT: Negative for hearing loss.   Eyes: Positive for photophobia. Negative for blurred vision.  Gastrointestinal: Negative for vomiting and abdominal pain.  Neurological: Positive for headaches. Negative for focal weakness and loss of balance.  All other systems reviewed and are negative.     Allergies  Enalapril; Hydrocodone; Latex; and Relpax  Home Medications   Prior to Admission medications   Medication Sig Start Date End Date Taking? Authorizing Provider  amLODipine (NORVASC) 5 MG tablet Take 5 mg by mouth daily.   Yes Historical Provider, MD  aspirin EC 81 MG tablet Take 81 mg by mouth every morning.   Yes Historical Provider, MD  gabapentin (NEURONTIN) 800 MG tablet Take 800 mg by mouth 3 (three) times daily.   Yes Historical Provider, MD  Evelina Bucy BILOBA COMPLEX PO Take 1 capsule by mouth daily.    Yes Historical Provider, MD  losartan (COZAAR) 50 MG tablet Take 50 mg by mouth daily.   Yes Historical Provider, MD  metFORMIN (GLUCOPHAGE) 500 MG tablet Take 500 mg by mouth 2 (two) times daily with a meal.   Yes Historical Provider, MD  Multiple Vitamin (MULTIVITAMIN WITH MINERALS) TABS Take 1 tablet by mouth daily.   Yes Historical Provider, MD  pravastatin (PRAVACHOL) 10 MG tablet Take 10 mg by mouth at bedtime.   Yes Historical Provider, MD  propranolol (INDERAL) 60  MG tablet Take 60 mg by mouth 2 (two) times daily.   Yes Historical Provider, MD  vitamin C (ASCORBIC ACID) 500 MG tablet Take 500 mg by mouth daily.   Yes Historical Provider, MD  Alpha-Lipoic Acid 100 MG CAPS Take 100 mg by mouth daily.    Historical Provider, MD  carbamazepine (TEGRETOL) 100 MG chewable tablet Chew 100 mg by mouth 3 (three) times daily.    Historical Provider, MD  DULoxetine (CYMBALTA) 30 MG capsule Take 30 mg by mouth daily.    Historical Provider, MD  fish oil-omega-3 fatty acids 1000 MG capsule Take 1 g by mouth 3 (three)  times daily.     Historical Provider, MD  mexiletine (MEXITIL) 150 MG capsule Take 150 mg by mouth 3 (three) times daily.    Historical Provider, MD   BP 134/85 mmHg  Pulse 67  Temp(Src) 98.2 F (36.8 C) (Oral)  Resp 18  Ht 5' (1.524 m)  Wt 112 lb (50.803 kg)  BMI 21.87 kg/m2  SpO2 100% Physical Exam  Constitutional: She is oriented to person, place, and time. She appears well-developed and well-nourished. No distress.  HENT:  Head: Normocephalic.  Eyes: Conjunctivae are normal.  Neck: Neck supple. No tracheal deviation present.  Cardiovascular: Normal rate and regular rhythm.   Pulmonary/Chest: Effort normal. No respiratory distress.  Abdominal: Soft. She exhibits no distension.  Neurological: She is alert and oriented to person, place, and time. She has normal strength. No cranial nerve deficit or sensory deficit. Coordination normal. GCS eye subscore is 4. GCS verbal subscore is 5. GCS motor subscore is 6.  Normal finger to nose testing and rapid alternating movement  Skin: Skin is warm and dry.  Psychiatric: She has a normal mood and affect.    ED Course  Procedures (including critical care time) Labs Review Labs Reviewed - No data to display  Imaging Review No results found. I have personally reviewed and evaluated these images and lab results as part of my medical decision-making.   EKG Interpretation None      MDM   Final diagnoses:  Acute nonintractable headache, unspecified headache type    54 y.o. female presents with migraine headache that is similar to prior starting 1 week ago. Not responding to supportive care measures at home. No neurologic deficits here and otherwise well appearing despite discomfort. No red flag symptoms for headache, no signs or symptoms of spontaneous ICH. Provided migraine cocktail with good relief of symptoms. Plan to follow up with PCP as needed and return precautions discussed for worsening or new concerning symptoms.      Leo Grosser, MD 03/27/15 [phone]

## 2015-03-27 NOTE — ED Notes (Signed)
DC instructions provided to husband, discussed pain control, rest and PO fluid intake upon returning home. Also per EDP recommendations to make a follow up appointment with primary MD to discuss pain control and review current medications. Opportunity for questions provided. Teach Back Method used

## 2015-03-27 NOTE — ED Notes (Signed)
NS at [facility] via gravity initiated

## 2015-03-27 NOTE — ED Notes (Signed)
MD at bedside. 

## 2015-03-27 NOTE — ED Notes (Signed)
Pt states having light sensitivity

## 2016-06-03 DIAGNOSIS — G894 Chronic pain syndrome: Secondary | ICD-10-CM | POA: Insufficient documentation

## 2017-01-31 DIAGNOSIS — R404 Transient alteration of awareness: Secondary | ICD-10-CM | POA: Insufficient documentation

## 2017-06-02 DIAGNOSIS — E785 Hyperlipidemia, unspecified: Secondary | ICD-10-CM | POA: Diagnosis not present

## 2017-06-02 DIAGNOSIS — Z01118 Encounter for examination of ears and hearing with other abnormal findings: Secondary | ICD-10-CM | POA: Diagnosis not present

## 2017-06-02 DIAGNOSIS — M797 Fibromyalgia: Secondary | ICD-10-CM | POA: Diagnosis not present

## 2017-06-02 DIAGNOSIS — Z5181 Encounter for therapeutic drug level monitoring: Secondary | ICD-10-CM | POA: Diagnosis not present

## 2017-06-02 DIAGNOSIS — Z0000 Encounter for general adult medical examination without abnormal findings: Secondary | ICD-10-CM | POA: Diagnosis not present

## 2017-06-02 DIAGNOSIS — E1140 Type 2 diabetes mellitus with diabetic neuropathy, unspecified: Secondary | ICD-10-CM | POA: Diagnosis not present

## 2017-06-02 DIAGNOSIS — G43909 Migraine, unspecified, not intractable, without status migrainosus: Secondary | ICD-10-CM | POA: Diagnosis not present

## 2017-06-02 DIAGNOSIS — F419 Anxiety disorder, unspecified: Secondary | ICD-10-CM | POA: Diagnosis not present

## 2017-06-02 DIAGNOSIS — E119 Type 2 diabetes mellitus without complications: Secondary | ICD-10-CM | POA: Diagnosis not present

## 2017-06-02 DIAGNOSIS — Z136 Encounter for screening for cardiovascular disorders: Secondary | ICD-10-CM | POA: Diagnosis not present

## 2017-06-02 DIAGNOSIS — I10 Essential (primary) hypertension: Secondary | ICD-10-CM | POA: Diagnosis not present

## 2017-06-16 DIAGNOSIS — E119 Type 2 diabetes mellitus without complications: Secondary | ICD-10-CM | POA: Diagnosis not present

## 2017-06-16 DIAGNOSIS — F419 Anxiety disorder, unspecified: Secondary | ICD-10-CM | POA: Diagnosis not present

## 2017-06-16 DIAGNOSIS — I10 Essential (primary) hypertension: Secondary | ICD-10-CM | POA: Diagnosis not present

## 2017-06-16 DIAGNOSIS — Z0000 Encounter for general adult medical examination without abnormal findings: Secondary | ICD-10-CM | POA: Diagnosis not present

## 2017-06-16 DIAGNOSIS — E785 Hyperlipidemia, unspecified: Secondary | ICD-10-CM | POA: Diagnosis not present

## 2017-06-16 DIAGNOSIS — M797 Fibromyalgia: Secondary | ICD-10-CM | POA: Diagnosis not present

## 2017-06-16 DIAGNOSIS — E1140 Type 2 diabetes mellitus with diabetic neuropathy, unspecified: Secondary | ICD-10-CM | POA: Diagnosis not present

## 2017-06-16 DIAGNOSIS — G43909 Migraine, unspecified, not intractable, without status migrainosus: Secondary | ICD-10-CM | POA: Diagnosis not present

## 2017-06-19 DIAGNOSIS — E119 Type 2 diabetes mellitus without complications: Secondary | ICD-10-CM | POA: Diagnosis not present

## 2017-06-19 DIAGNOSIS — H524 Presbyopia: Secondary | ICD-10-CM | POA: Diagnosis not present

## 2017-06-19 DIAGNOSIS — H5203 Hypermetropia, bilateral: Secondary | ICD-10-CM | POA: Diagnosis not present

## 2017-06-27 DIAGNOSIS — R413 Other amnesia: Secondary | ICD-10-CM | POA: Diagnosis not present

## 2017-06-27 DIAGNOSIS — M797 Fibromyalgia: Secondary | ICD-10-CM | POA: Diagnosis not present

## 2017-06-27 DIAGNOSIS — F411 Generalized anxiety disorder: Secondary | ICD-10-CM | POA: Diagnosis not present

## 2017-08-14 DIAGNOSIS — R51 Headache: Secondary | ICD-10-CM | POA: Diagnosis not present

## 2017-08-14 DIAGNOSIS — R413 Other amnesia: Secondary | ICD-10-CM | POA: Diagnosis not present

## 2017-08-14 DIAGNOSIS — R479 Unspecified speech disturbances: Secondary | ICD-10-CM | POA: Diagnosis not present

## 2017-09-02 DIAGNOSIS — F411 Generalized anxiety disorder: Secondary | ICD-10-CM | POA: Diagnosis not present

## 2017-09-02 DIAGNOSIS — R413 Other amnesia: Secondary | ICD-10-CM | POA: Diagnosis not present

## 2017-09-02 DIAGNOSIS — M797 Fibromyalgia: Secondary | ICD-10-CM | POA: Diagnosis not present

## 2017-09-15 DIAGNOSIS — M797 Fibromyalgia: Secondary | ICD-10-CM | POA: Diagnosis not present

## 2017-09-15 DIAGNOSIS — I10 Essential (primary) hypertension: Secondary | ICD-10-CM | POA: Diagnosis not present

## 2017-09-15 DIAGNOSIS — G43909 Migraine, unspecified, not intractable, without status migrainosus: Secondary | ICD-10-CM | POA: Diagnosis not present

## 2017-09-15 DIAGNOSIS — E785 Hyperlipidemia, unspecified: Secondary | ICD-10-CM | POA: Diagnosis not present

## 2017-09-15 DIAGNOSIS — F419 Anxiety disorder, unspecified: Secondary | ICD-10-CM | POA: Diagnosis not present

## 2017-09-15 DIAGNOSIS — E119 Type 2 diabetes mellitus without complications: Secondary | ICD-10-CM | POA: Diagnosis not present

## 2017-09-15 DIAGNOSIS — E1140 Type 2 diabetes mellitus with diabetic neuropathy, unspecified: Secondary | ICD-10-CM | POA: Diagnosis not present

## 2017-09-29 DIAGNOSIS — M797 Fibromyalgia: Secondary | ICD-10-CM | POA: Diagnosis not present

## 2017-09-29 DIAGNOSIS — E119 Type 2 diabetes mellitus without complications: Secondary | ICD-10-CM | POA: Diagnosis not present

## 2017-09-29 DIAGNOSIS — E785 Hyperlipidemia, unspecified: Secondary | ICD-10-CM | POA: Diagnosis not present

## 2017-09-29 DIAGNOSIS — G43909 Migraine, unspecified, not intractable, without status migrainosus: Secondary | ICD-10-CM | POA: Diagnosis not present

## 2017-09-29 DIAGNOSIS — I10 Essential (primary) hypertension: Secondary | ICD-10-CM | POA: Diagnosis not present

## 2017-09-29 DIAGNOSIS — E1140 Type 2 diabetes mellitus with diabetic neuropathy, unspecified: Secondary | ICD-10-CM | POA: Diagnosis not present

## 2017-10-16 DIAGNOSIS — N183 Chronic kidney disease, stage 3 (moderate): Secondary | ICD-10-CM | POA: Diagnosis not present

## 2017-10-16 DIAGNOSIS — R809 Proteinuria, unspecified: Secondary | ICD-10-CM | POA: Diagnosis not present

## 2017-10-16 DIAGNOSIS — N390 Urinary tract infection, site not specified: Secondary | ICD-10-CM | POA: Diagnosis not present

## 2017-10-16 DIAGNOSIS — I10 Essential (primary) hypertension: Secondary | ICD-10-CM | POA: Diagnosis not present

## 2017-10-28 DIAGNOSIS — E785 Hyperlipidemia, unspecified: Secondary | ICD-10-CM | POA: Diagnosis not present

## 2017-10-28 DIAGNOSIS — E1140 Type 2 diabetes mellitus with diabetic neuropathy, unspecified: Secondary | ICD-10-CM | POA: Diagnosis not present

## 2017-10-28 DIAGNOSIS — M797 Fibromyalgia: Secondary | ICD-10-CM | POA: Diagnosis not present

## 2017-10-28 DIAGNOSIS — E119 Type 2 diabetes mellitus without complications: Secondary | ICD-10-CM | POA: Diagnosis not present

## 2017-10-28 DIAGNOSIS — N951 Menopausal and female climacteric states: Secondary | ICD-10-CM | POA: Diagnosis not present

## 2017-10-28 DIAGNOSIS — G43909 Migraine, unspecified, not intractable, without status migrainosus: Secondary | ICD-10-CM | POA: Diagnosis not present

## 2017-10-28 DIAGNOSIS — N644 Mastodynia: Secondary | ICD-10-CM | POA: Diagnosis not present

## 2017-10-28 DIAGNOSIS — I10 Essential (primary) hypertension: Secondary | ICD-10-CM | POA: Diagnosis not present

## 2017-11-03 DIAGNOSIS — N182 Chronic kidney disease, stage 2 (mild): Secondary | ICD-10-CM | POA: Diagnosis not present

## 2017-11-05 DIAGNOSIS — R922 Inconclusive mammogram: Secondary | ICD-10-CM | POA: Diagnosis not present

## 2017-11-05 DIAGNOSIS — N644 Mastodynia: Secondary | ICD-10-CM | POA: Diagnosis not present

## 2017-11-12 ENCOUNTER — Other Ambulatory Visit: Payer: Self-pay

## 2017-11-12 NOTE — Patient Outreach (Signed)
[name] Care Management  11/12/2017  Nichole Garza January 21, 1961 [id number]   Telephone call to review health risk assessment and screen for care management needs for  [employer]. Member states that she can not talk at this time and to call her back later today. Called member back later in the day and she states she can not talk now and she will call RNCM back later. Plan to outreach to her tomorrow if call not returned today by end of business Peter Garter RN, [facility], [place] Management Coordinator [facility] Care Management [phone]

## 2017-11-17 ENCOUNTER — Other Ambulatory Visit: Payer: Self-pay

## 2017-11-17 NOTE — Patient Outreach (Signed)
[facility]) Care Management  11/17/2017  Nichole Garza [birth year] [id number]   Telephone call to review health risk assessment and screen for care management needs for  [employer].  Member states that she has pain and neuropathy that is her biggest problem.  States she sees a pain doctor and her primary care doctor for these issues.  States she has diabetes and her last hemoglobin A1C was over 9%.  States her doctor changed some of her medications and her sugars are doing better now.  States that she checks her sugars twice a day and she was 114 this morning.  States her sugars range from from 80-160.  States she is not depressed at this time but she is worried about her memory and her kidneys.  States she is to see specialist for these problems.  States she gets her medications filled at [facility] and she usually can afford her medications depending on the time of the month.  States she does not need help with her ADL/IADLs and her husband or brother can help her if needed.  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  States she does not have Advanced Directives and would like some information sent to her.  Member has hx per chart review of Type 2 DM, HTN, neuropathy, chronic pain, and fibromyalgia.   Telephone screening call completed to review members health risk assessment.   Member agreeable to having a Engineer, maintenance for diabetes and information on Advanced Directives sent to her Plan to send Advanced Directive packet along with education on Advanced Directives, successful outreach letter with program brochure and 24 hr nurse line magnet. Peter Garter RN, Jackquline Denmark, CDE Care Management Coordinator [facility] Care Management [phone]

## 2017-11-18 DIAGNOSIS — I10 Essential (primary) hypertension: Secondary | ICD-10-CM | POA: Diagnosis not present

## 2017-11-18 DIAGNOSIS — E785 Hyperlipidemia, unspecified: Secondary | ICD-10-CM | POA: Diagnosis not present

## 2017-11-18 DIAGNOSIS — N644 Mastodynia: Secondary | ICD-10-CM | POA: Diagnosis not present

## 2017-11-18 DIAGNOSIS — N951 Menopausal and female climacteric states: Secondary | ICD-10-CM | POA: Diagnosis not present

## 2017-11-18 DIAGNOSIS — E1140 Type 2 diabetes mellitus with diabetic neuropathy, unspecified: Secondary | ICD-10-CM | POA: Diagnosis not present

## 2017-11-18 DIAGNOSIS — M797 Fibromyalgia: Secondary | ICD-10-CM | POA: Diagnosis not present

## 2017-11-18 DIAGNOSIS — E119 Type 2 diabetes mellitus without complications: Secondary | ICD-10-CM | POA: Diagnosis not present

## 2017-11-18 DIAGNOSIS — G43909 Migraine, unspecified, not intractable, without status migrainosus: Secondary | ICD-10-CM | POA: Diagnosis not present

## 2017-11-27 ENCOUNTER — Other Ambulatory Visit: Payer: Self-pay | Admitting: *Deleted

## 2017-11-27 DIAGNOSIS — I10 Essential (primary) hypertension: Secondary | ICD-10-CM | POA: Diagnosis not present

## 2017-11-27 DIAGNOSIS — N390 Urinary tract infection, site not specified: Secondary | ICD-10-CM | POA: Diagnosis not present

## 2017-11-27 DIAGNOSIS — E119 Type 2 diabetes mellitus without complications: Secondary | ICD-10-CM | POA: Diagnosis not present

## 2017-11-27 DIAGNOSIS — N182 Chronic kidney disease, stage 2 (mild): Secondary | ICD-10-CM | POA: Diagnosis not present

## 2017-11-27 NOTE — Patient Outreach (Signed)
[facility]) Care Management  11/27/2017  Nichole Garza November 28, 1960 [id number]   RN Health Coach Initial Assessment  Referral Date:  11/17/2017 Referral Source:  Health Risk Assessment Screening Reason for Referral:  Disease Management Education Insurance:  [employer]   Outreach Attempt:  Outreach attempt #1 to patient for introduction call.  Call answered but no one spoke and hung up after few seconds.   Plan:  RN Health Coach will attempt another telephone outreach to patient for introduction within the next 3-4 business days.   [place] [phone] [email or web address]

## 2017-12-03 ENCOUNTER — Other Ambulatory Visit: Payer: Self-pay | Admitting: *Deleted

## 2017-12-03 NOTE — Patient Outreach (Signed)
[facility]) Care Management  12/03/2017  Asiya Shaw-Falconer 04/12/1960 [id number]   RN Health Coach Initial Assessment  Referral Date:  11/17/2017 Referral Source:  Health Risk Assessment Screening Reason for Referral:  Disease Management Education Insurance:  [employer]   Outreach Attempt:  Successful telephone outreach to patient for introductory call.  HIPAA verified with patient.  RN Health Coach introduced self and role.  Patient verbally agrees to Disease Management telephone calls.    Plan:  RN Health Coach will make outreach to complete initial telephone assessment with in the next 10 business days.  [place] [phone] [email or web address]

## 2017-12-08 DIAGNOSIS — I950 Idiopathic hypotension: Secondary | ICD-10-CM | POA: Diagnosis not present

## 2017-12-08 DIAGNOSIS — E872 Acidosis: Secondary | ICD-10-CM | POA: Diagnosis not present

## 2017-12-08 DIAGNOSIS — R531 Weakness: Secondary | ICD-10-CM | POA: Diagnosis not present

## 2017-12-08 DIAGNOSIS — R55 Syncope and collapse: Secondary | ICD-10-CM | POA: Diagnosis not present

## 2017-12-08 DIAGNOSIS — R0902 Hypoxemia: Secondary | ICD-10-CM | POA: Diagnosis not present

## 2017-12-08 DIAGNOSIS — E11649 Type 2 diabetes mellitus with hypoglycemia without coma: Secondary | ICD-10-CM | POA: Diagnosis not present

## 2017-12-08 DIAGNOSIS — R110 Nausea: Secondary | ICD-10-CM | POA: Diagnosis not present

## 2017-12-08 DIAGNOSIS — E1165 Type 2 diabetes mellitus with hyperglycemia: Secondary | ICD-10-CM | POA: Diagnosis not present

## 2017-12-08 DIAGNOSIS — R9431 Abnormal electrocardiogram [ECG] [EKG]: Secondary | ICD-10-CM | POA: Diagnosis not present

## 2017-12-08 DIAGNOSIS — R140 Abdominal distension (gaseous): Secondary | ICD-10-CM | POA: Diagnosis not present

## 2017-12-08 DIAGNOSIS — R42 Dizziness and giddiness: Secondary | ICD-10-CM | POA: Diagnosis not present

## 2017-12-08 DIAGNOSIS — I959 Hypotension, unspecified: Secondary | ICD-10-CM | POA: Diagnosis not present

## 2017-12-10 DIAGNOSIS — E785 Hyperlipidemia, unspecified: Secondary | ICD-10-CM | POA: Diagnosis not present

## 2017-12-10 DIAGNOSIS — E119 Type 2 diabetes mellitus without complications: Secondary | ICD-10-CM | POA: Diagnosis not present

## 2017-12-10 DIAGNOSIS — M797 Fibromyalgia: Secondary | ICD-10-CM | POA: Diagnosis not present

## 2017-12-10 DIAGNOSIS — G43909 Migraine, unspecified, not intractable, without status migrainosus: Secondary | ICD-10-CM | POA: Diagnosis not present

## 2017-12-10 DIAGNOSIS — I10 Essential (primary) hypertension: Secondary | ICD-10-CM | POA: Diagnosis not present

## 2017-12-10 DIAGNOSIS — E1140 Type 2 diabetes mellitus with diabetic neuropathy, unspecified: Secondary | ICD-10-CM | POA: Diagnosis not present

## 2017-12-10 DIAGNOSIS — N951 Menopausal and female climacteric states: Secondary | ICD-10-CM | POA: Diagnosis not present

## 2017-12-11 ENCOUNTER — Other Ambulatory Visit: Payer: Self-pay | Admitting: *Deleted

## 2017-12-11 NOTE — Patient Outreach (Signed)
[facility]) Care Management  12/11/2017  Amand Shaw-Falconer 02-Aug-1960 [id number]   RN Health Coach Initial Assessment  Referral Date:11/17/2017 Referral Source:Health Risk Assessment Screening Reason for Referral:Disease Management Education Insurance:[employer]   Outreach Attempt:  Successful telephone outreach to patient for initial telephone assessment.  HIPAA verified with patient.  Patient initially stating she does not feel well today but wanting to try and complete the initial assessment.  Patient's speech a little garbled and slow initially.  Reports having an emergency room visit at [facility] on Monday due to syncope and hypotension.  Seen primary care provider, Dr. Vista Lawman yesterday and was told to hold her losartan.  Patient stating she continues to feel weak, lightheaded, and tired.  States she is to follow up with primary care provider on Monday.  Requesting another outreach call to complete initial telephone assessment when she feels better.  Plan:  RN Health Coach will attempt another outreach to complete initial telephone assessment within the next 15 business days per patient request   [place] [phone] [email or web address]

## 2017-12-15 DIAGNOSIS — E785 Hyperlipidemia, unspecified: Secondary | ICD-10-CM | POA: Diagnosis not present

## 2017-12-15 DIAGNOSIS — M797 Fibromyalgia: Secondary | ICD-10-CM | POA: Diagnosis not present

## 2017-12-15 DIAGNOSIS — N951 Menopausal and female climacteric states: Secondary | ICD-10-CM | POA: Diagnosis not present

## 2017-12-15 DIAGNOSIS — E119 Type 2 diabetes mellitus without complications: Secondary | ICD-10-CM | POA: Diagnosis not present

## 2017-12-15 DIAGNOSIS — E1140 Type 2 diabetes mellitus with diabetic neuropathy, unspecified: Secondary | ICD-10-CM | POA: Diagnosis not present

## 2017-12-15 DIAGNOSIS — G43909 Migraine, unspecified, not intractable, without status migrainosus: Secondary | ICD-10-CM | POA: Diagnosis not present

## 2017-12-15 DIAGNOSIS — I10 Essential (primary) hypertension: Secondary | ICD-10-CM | POA: Diagnosis not present

## 2017-12-17 DIAGNOSIS — G894 Chronic pain syndrome: Secondary | ICD-10-CM | POA: Diagnosis not present

## 2017-12-17 DIAGNOSIS — M797 Fibromyalgia: Secondary | ICD-10-CM | POA: Diagnosis not present

## 2017-12-25 ENCOUNTER — Other Ambulatory Visit: Payer: Self-pay | Admitting: *Deleted

## 2017-12-25 NOTE — Patient Outreach (Signed)
[facility]) Care Management  12/25/2017  Nichole Garza 29-Mar-1961 [id number]   RN Health Coach Initial Assessment  Referral Date:11/17/2017 Referral Source:Health Risk Assessment Screening Reason for Referral:Disease Management Education Insurance:[employer]   Outreach Attempt:  Outreach attempt #2 to patient for initial telephone assessment. Patient answered and verified HIPAA.  Stated she was driving at the current time.  Unable to complete initial telephone assessment.   Plan:  RN Health Coach will make another outreach attempt to complete initial telephone assessment within the month of October.  [place] [phone] Nichole Garza [email or web address]

## 2017-12-31 DIAGNOSIS — M797 Fibromyalgia: Secondary | ICD-10-CM | POA: Diagnosis not present

## 2017-12-31 DIAGNOSIS — N183 Chronic kidney disease, stage 3 (moderate): Secondary | ICD-10-CM | POA: Diagnosis not present

## 2017-12-31 DIAGNOSIS — D649 Anemia, unspecified: Secondary | ICD-10-CM | POA: Diagnosis not present

## 2017-12-31 DIAGNOSIS — I129 Hypertensive chronic kidney disease with stage 1 through stage 4 chronic kidney disease, or unspecified chronic kidney disease: Secondary | ICD-10-CM | POA: Diagnosis not present

## 2017-12-31 DIAGNOSIS — E785 Hyperlipidemia, unspecified: Secondary | ICD-10-CM | POA: Diagnosis not present

## 2017-12-31 DIAGNOSIS — E1122 Type 2 diabetes mellitus with diabetic chronic kidney disease: Secondary | ICD-10-CM | POA: Diagnosis not present

## 2017-12-31 DIAGNOSIS — N2581 Secondary hyperparathyroidism of renal origin: Secondary | ICD-10-CM | POA: Diagnosis not present

## 2018-01-08 ENCOUNTER — Other Ambulatory Visit: Payer: Self-pay | Admitting: *Deleted

## 2018-01-08 ENCOUNTER — Encounter: Payer: Self-pay | Admitting: *Deleted

## 2018-01-08 DIAGNOSIS — I10 Essential (primary) hypertension: Secondary | ICD-10-CM | POA: Diagnosis not present

## 2018-01-08 DIAGNOSIS — Z23 Encounter for immunization: Secondary | ICD-10-CM | POA: Diagnosis not present

## 2018-01-08 DIAGNOSIS — N951 Menopausal and female climacteric states: Secondary | ICD-10-CM | POA: Diagnosis not present

## 2018-01-08 DIAGNOSIS — E1140 Type 2 diabetes mellitus with diabetic neuropathy, unspecified: Secondary | ICD-10-CM | POA: Diagnosis not present

## 2018-01-08 DIAGNOSIS — E119 Type 2 diabetes mellitus without complications: Secondary | ICD-10-CM | POA: Diagnosis not present

## 2018-01-08 DIAGNOSIS — M797 Fibromyalgia: Secondary | ICD-10-CM | POA: Diagnosis not present

## 2018-01-08 NOTE — Patient Outreach (Signed)
[facility]) Care Management  [place]  01/08/2018   Tericka Shaw-Falconer 18-Apr-1960 [id number]   RN Health Coach Initial Assessment   Referral Date:  11/17/2017 Referral Source:  Health Risk Assessment Screening Reason for Referral:  Disease Management Education Insurance:  [employer]   Outreach Attempt:  Successful telephone outreach to patient for initial telephone assessment.  HIPAA verified with patient.  Patient completed initial telephone assessment.  Social:  Patient lives at home with husband. Reports being independent with ADLs and IADLs.  Ambulates independently and denies any falls in the last year.  States she drives herself or has other family drive her to medical appointments.  DME in the home include:  CBG meter, scale, eyeglasses and partial upper dentures.  Conditions:  Per chart review and discussion with patient, PMH include but not limited to:  Diabetes, fibromyalgia, polyneuropathy, hypertension, TIA, and renal disease.  Patient reporting last emergency room visit was for presyncope related to hypotension.  Medications have been adjusted.  Continues to report periods of presyncope and not feeling well.  Has seen a nephrologist who has referred her to a hematologist she will see in the next few weeks.  Patient also reporting increase in forgetfulness and trouble finding words.  Encouraged patient to discuss this with primary care provider.  Monitors her blood sugars twice a day.  Has not checked blood sugar yet today but reports fasting blood sugar ranges of 110-160's.  States she is not sure of her last Hgb A1C result but believes it was about 9 with her goal of being 7.  Patient also stating she is really depressed related to her current state of health and feeling like she needs to take care of her parents and other family members, but cannot in her current state.  [facility] Social Work consult discussed and patient verbally agrees.  Weighs  herself daily, today's weight was 111 pounds.  Medications:  Patient reports taking about 15 medications.  States she manages her medications herself with a weekly pill box fill.  Initially stating she has trouble affording her co pays for medications, then stating her largest co pay is for Cymbalta at $29 for a 90 day supply.  Encouraged patient to spread out co pays to assist with affordability.   Encounter Medications:  Outpatient Encounter Medications as of 01/08/2018  Medication Sig Note  . amLODipine (NORVASC) 5 MG tablet Take 5 mg by mouth daily.   Marland Kitchen aspirin EC 81 MG tablet Take 81 mg by mouth every morning.   . DULoxetine (CYMBALTA) 30 MG capsule Take 30 mg by mouth daily. 01/08/2018: Reports taking 60 mg daily  . fish oil-omega-3 fatty acids 1000 MG capsule Take 1 g by mouth 3 (three) times daily.    Marland Kitchen gabapentin (NEURONTIN) 800 MG tablet Take 800 mg by mouth 3 (three) times daily.   Marland Kitchen GINKGO BILOBA COMPLEX PO Take 1 capsule by mouth daily.  01/08/2018: Reports taking twice a day  . metFORMIN (GLUCOPHAGE) 500 MG tablet Take 500 mg by mouth 2 (two) times daily with a meal.   . Misc Natural Products (COLON CLEANSE) CAPS Take 1 capsule by mouth 2 (two) times daily.   . Multiple Vitamin (MULTIVITAMIN WITH MINERALS) TABS Take 1 tablet by mouth daily.   . pravastatin (PRAVACHOL) 10 MG tablet Take 10 mg by mouth at bedtime. 08/20/2013: .   Marland Kitchen tiZANidine (ZANAFLEX) 4 MG capsule Take 4 mg by mouth daily. Take 2 tablets once daily   .  vitamin C (ASCORBIC ACID) 500 MG tablet Take 500 mg by mouth daily.   . Alpha-Lipoic Acid 100 MG CAPS Take 100 mg by mouth daily. 01/08/2018: Reports not taking  . Black Cohosh 20 MG TABS Take by mouth.   . carbamazepine (TEGRETOL) 100 MG chewable tablet Chew 100 mg by mouth 3 (three) times daily. 01/08/2018: Reports not taking  . losartan (COZAAR) 50 MG tablet Take 50 mg by mouth daily. 01/08/2018: Reports not taking  . mexiletine (MEXITIL) 150 MG capsule Take 150  mg by mouth 3 (three) times daily. 01/08/2018: Reports not taking  . propranolol (INDERAL) 60 MG tablet Take 60 mg by mouth 2 (two) times daily. 01/08/2018: Reports not taking   No facility-administered encounter medications on file as of 01/08/2018.     Functional Status:  In your present state of health, do you have any difficulty performing the following activities: 01/08/2018  Hearing? N  Vision? N  Difficulty concentrating or making decisions? Y  Comment become very forgetful  Walking or climbing stairs? N  Dressing or bathing? N  Doing errands, shopping? N  Preparing Food and eating ? N  Using the Toilet? N  In the past six months, have you accidently leaked urine? N  Do you have problems with loss of bowel control? N  Managing your Medications? N  Managing your Finances? N  Housekeeping or managing your Housekeeping? N  Some recent data might be hidden    Fall/Depression Screening: Fall Risk  01/08/2018  Falls in the past year? No   PHQ 2/9 Scores 01/08/2018 11/17/2017  PHQ - 2 Score 3 1  PHQ- 9 Score 7 -    THN CM Care Plan Problem One     Most Recent Value  Care Plan Problem One  Knowledge defiecit related to self care management of diabetes.  Role Documenting the Problem One  [place] for Problem One  Active  [facility] Long Term Goal   Patient will verbalize reduction in her Hgb A1C by 0.3 points in the next 90 days.  THN Long Term Goal Start Date  01/08/18  Interventions for Problem One Long Term Goal  Reviewed and discussed current care plan and goals with patient, encouraged to keep and attend medical appointments, reviewed medications and encouraged compliance, encouraged patient to discuss current Hgb A1C with primary care provider, discussed ways to reduce A1C, THN Social Work referral for depression resources  [facility] CM Short Term Goal #1   Patient will verbalize 2 foods she has limited in the next 90 days.  THN CM Short Term Goal #1 Start Date   01/08/18  Interventions for Short Term Goal #1  Sending Living Well with Diabetes Education Booklet, encouraged patient to review educational booklet to assit with low carbohydrate diet, discussed low carbohydrate diet with patient     Appointments:   Patient reporting she has an appointment with her primary care provider, Dr. Vista Lawman today.  Also stating she is awaiting consultation with a podiatrist for diabetic foot exam and consultation with a hematologist.  Patient reporting her next diabetic eye exam is scheduled for November 2019.  Advanced Directives:  Denies having an Advance Directive in place.  Requesting information to create one.   Consent:  [facility] services reviewed and discussed.  Patient verbally agrees to Disease Management Outreaches and [facility] Social Work Referral.  Plan: [place] will make next telephone outreach to patient in the month of January (patient stating she will be  out of the country in December and beginning of January). RN Health Coach will send [facility] SW referral for possible assistance with community resources related to depression resources. RN Health Coach will send primary MD barriers letter. RN Health Coach will route initial telephone assessment note to primary MD. [place] will send patient [place]. RN Health Coach will send patient Living Well with Diabetes Packet. RN Health Coach will send patient Emergency planning/management officer.  [place] [phone] [email or web address]

## 2018-01-13 ENCOUNTER — Encounter: Payer: Self-pay | Admitting: *Deleted

## 2018-01-13 ENCOUNTER — Other Ambulatory Visit: Payer: Self-pay | Admitting: *Deleted

## 2018-01-13 NOTE — Patient Outreach (Signed)
[facility]) Care Management  01/13/2018  Nichole Garza 12/02/1960 [id number]   CSW made an initial attempt to try and contact patient today to perform the initial phone assessment, as well as assess and assist with social work needs and services, without success.  A HIPAA compliant message was left for patient on voicemail.  CSW is currently awaiting a return call.  CSW will make a second outreach attempt within the next 3-4 business days, if a return call is not received from patient in the meantime.  CSW will also mail an Outreach Letter to patient's home requesting that patient contact CSW if patient is interested in receiving social work services through [place] with Scientist, clinical (histocompatibility and immunogenetics). [name], BSW, MSW, LCSW  Licensed Education officer, environmental Health System  Mailing [place] [address] [place], [place] [address] Physical Address-[address], [place],  [address] Toll Free Main # [phone] Fax # [phone] Cell # [phone]  Office # [phone] [name]

## 2018-01-14 ENCOUNTER — Encounter: Payer: Self-pay | Admitting: *Deleted

## 2018-01-14 ENCOUNTER — Other Ambulatory Visit: Payer: Self-pay | Admitting: *Deleted

## 2018-01-14 NOTE — Patient Outreach (Signed)
[place] [facility]) Care Management  01/14/2018  Nichole Garza 1960-08-07 [id number]   CSW received a return call from patient today, in regards to the HIPAA complaint message left on voicemail for patient by CSW on 01/13/2018.  CSW was able to perform the initial phone assessment, as well as assess and assist with social work needs and services.  CSW introduced self, explained role and types of services provided through [place] [facility] ([place] [facility]).  CSW further explained to patient that CSW works with patient's [place], also with [place] [facility], [name]. CSW then explained the reason for the call, indicating that Mrs. Tarpley thought that patient would benefit from social work services and resources to assist with counseling and supportive services for depression.  CSW obtained two HIPAA compliant identifiers from patient, which included patient's name and date of birth. Patient admits to feeling "depressed", wanting to speak with CSW to obtain counseling and supportive services, as well as a list of community providers.  CSW agreed to meet with patient for an initial home visit to provide counseling services, on a short-term basis, then refer patient to a therapist of choice.  CSW will also print EMMI information to provide to patient, pertaining specifically to "Signs and Symptoms of Depression".  CSW will teach patient deep breathing exercises and relaxation techniques, in hopes to reduce symptoms of depression.  The initial home visit has been scheduled for Thursday, January 22, 2018 at 12:00 PM.  Patient has CSW's contact information and has been encouraged to contact CSW directly if anything changes or if patient needs to reschedule the visit.  Patient voiced understanding and was agreeable to this plan. THN CM Care Plan Problem One     Most Recent Value  Care Plan Problem One  Patient is experiencing symptoms of depression.   Role Documenting the Problem One  Clinical Social Worker  Care Plan for Problem One  Active  [facility] Long Term Goal   Patient will have a decrease in symptoms of depression, after receiving counseling and supportive services through CSW, within the next 45 days.  THN Long Term Goal Start Date  01/14/18  Interventions for Problem One Long Term Goal  CSW will provide counseling and supportive services to patient to try and help reduce symptoms of depression.  THN CM Short Term Goal #1   Patient will meet with CSW for an initial home visit to discuss symptoms of depression so that CSW can provide counseling and supportive services, within the next 10 days.  THN CM Short Term Goal #1 Start Date  01/14/18  Interventions for Short Term Goal #1  CSW and patient were able to schedule an initial home visit for Thursday, January 22, 2018 at 12:00PM.  [facility] CM Short Term Goal #2   Patient will review EMMI information provided to her by CSW, pertaining specifically to "Signs & Symptoms of Depression", within the next two weeks.  THN CM Short Term Goal #2 Start Date  01/14/18  Interventions for Short Term Goal #2  CSW will provide EMMI information to patient to educate herself about depression, as well as signs and symptoms to look for.  THN CM Short Term Goal #3  Patient will practice deep breathing exercises and relaxation techniques to help better cope with symptoms of depression, within the next 30 days.  THN CM Short Term Goal #3 Start Date  01/14/18  Interventions for Short Tern Goal #3  CSW will teach patient proper  use of deep breathing exercises and relaxation techniques.     [name], BSW, MSW, LCSW  Licensed Education officer, environmental Health System  Mailing [place] [address], [place], [place] [address] Physical Address-[address], [place], [place] [address] Toll Free Main # [phone] Fax # [phone] Cell # [phone]  Office #  [phone] [name]

## 2018-01-16 ENCOUNTER — Encounter: Payer: Self-pay | Admitting: Hematology & Oncology

## 2018-01-19 ENCOUNTER — Ambulatory Visit: Payer: Self-pay | Admitting: *Deleted

## 2018-01-20 DIAGNOSIS — I10 Essential (primary) hypertension: Secondary | ICD-10-CM | POA: Diagnosis not present

## 2018-01-20 DIAGNOSIS — E785 Hyperlipidemia, unspecified: Secondary | ICD-10-CM | POA: Diagnosis not present

## 2018-01-20 DIAGNOSIS — E119 Type 2 diabetes mellitus without complications: Secondary | ICD-10-CM | POA: Diagnosis not present

## 2018-01-20 DIAGNOSIS — M797 Fibromyalgia: Secondary | ICD-10-CM | POA: Diagnosis not present

## 2018-01-20 DIAGNOSIS — N951 Menopausal and female climacteric states: Secondary | ICD-10-CM | POA: Diagnosis not present

## 2018-01-20 DIAGNOSIS — G43909 Migraine, unspecified, not intractable, without status migrainosus: Secondary | ICD-10-CM | POA: Diagnosis not present

## 2018-01-20 DIAGNOSIS — E1140 Type 2 diabetes mellitus with diabetic neuropathy, unspecified: Secondary | ICD-10-CM | POA: Diagnosis not present

## 2018-01-22 ENCOUNTER — Other Ambulatory Visit: Payer: Self-pay | Admitting: *Deleted

## 2018-01-22 NOTE — Patient Outreach (Signed)
[facility]) Care Management  01/22/2018  Crosby Shaw-Falconer Dec 23, 1960 [id number]   CSW was able to meet with patient today in her home to perform the initial home visit.  CSW noted that patient's home was clean, well-kept and free from clutter.  Patient started the conversation by introducing CSW to her "therapy dog", named Muffin.  Patient reported that she adopted Muffin a little over a year ago to help her better cope with her symptoms of Fibromyalgia.  Patient indicated that Muffin is extremely supportive of her and is a great lap dog.  Several times during the visit, patient became tearful, for which Muffin came and sat in her lap and licked her hands and fingers.  Patient stated, "Muffin definitely knows when I am upset or depressed and she always tries to comfort me".  Patient also admits to having a very supportive family and church community.  Per patient, she was diagnosed with Fibromyalgia in 2011, after experiencing a great deal of pain throughout her body.  Patient indicated that she was also experiencing dizzy spells and frequent falls.  Patient indicated that there is currently no treatment for Fibromyalgia, other than pain medications, which she does not like to take.  Patient continues to try and exercise on a daily basis, when her body allows.  Patient admits to having to sleep upright in her recliner several nights a week, due to severe pain and discomfort.  Patient reported that she is definitely not getting enough sleep in the evenings, but is unable to sleep during the day.  Therefore, patient stated that she feels weak and fatigued all the time.  Patient believes that she was brought into this world to provide caregiver services to others.  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  Patient deeply regrets having to leave her job and do what she  loves.  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  Patient admitted that she would really like to be able to care for both of her elderly parents, but realizes her limitations.  Patient stated, "I don't really feel like I have a purpose, when all I do is sit around the house all day".  CSW talked with patient about providing volunteer services in the community, but patient denied, admitting that her days are "too unpredictable to commit".  Patient reported that she enjoys going for walks with Muffin, when she is able.  Patient also enjoys attending church and playing games on her cell phone, "until her fingers become numb from the Fibromyalgia.  Patient stated, "My life is completely different, not like me at all, because I can't do for myself like I would like, nor am I able to do for others anymore".  CSW provided attentive listening while patient was talking, in addition to counseling, supportive services and Cognitive Behavioral Therapy.  CSW also provided patient with EMMI information for her review, pertaining specifically to "Signs and Symptoms of Depression", which CSW will address with patient during the next scheduled home visit.  CSW talked with patient about attending a Fibromyalgia Support Group, titled:  FibroMyWay.  The group meets every first Thursday of the month from 6:30 PM to 8:30 PM at [employer] ([address] [place], [place]).  CSW was able to put patient  in contact with the group leader, Chrystine [email or web address] at [email or web address].  CSW explained that the focus of the support group is to "empower fibromyalgia sufferers to take control of their fibromyalgia and have it their way, as well as to give them a place to learn about ways to deal with fibromyalgia and new ways to effectively take back control of their lives".  Patient agreed to contact Mrs. Cakal to get enrolled in the next  class.     Patient admitted that she is consumed with thoughts of what her life used to be like, as opposed to what she has become.  CSW provided patient with Guided Imagery, and encouraged patient to think about all the good that she has done for others, as well as the good that she continues to try to do.  Patient reported that she is very much looking forward to visiting her family in [place].  Patient indicated that she, along with her husband, Archie Endo and Arnette Felts will leave for [place] on February 27, 2018 and plan to stay for a little over a month.  Patient reported that she can go completely without her pain medications while she is in [place] because she does not experience the same degree of pain.    CSW agreed to follow-up with patient again on Thursday, February 05, 2018 at 9:00 AM to perform a routine home visit.  During this visit, CSW will continue to provide counseling and supportive services, review EMMI information with patient to ensure understanding and teach patient proper use of deep breathing exercises and relaxation techniques to help patient better cope with symptoms of depression.  CSW provided patient with CSW's contact information, encouraging patient to contact CSW directly if patient needs assistance in the meantime, or if additional social work needs arise.  Patient voiced understanding and was agreeable to this plan.  THN CM Care Plan Problem One     Most Recent Value  Care Plan Problem One  Patient is experiencing symptoms of depression.  Role Documenting the Problem One  Clinical Social Worker  Care Plan for Problem One  Active  [facility] Long Term Goal   Patient will have a decrease in symptoms of depression, after receiving counseling and supportive services through CSW, within the next 45 days.  THN Long Term Goal Start Date  01/14/18  [facility] CM Short Term Goal #1   Patient will meet with CSW for an initial home visit to discuss symptoms of depression so that CSW can  provide counseling and supportive services, within the next 10 days.  THN CM Short Term Goal #1 Start Date  01/14/18  [facility] CM Short Term Goal #1 Met Date  01/22/18  Interventions for Short Term Goal #1  CSW was able to meet with patient today to have her explain her symptoms of depression, as CSW offered counseling and supportive services, as well as Cognitive Behavioral Therapy.  THN CM Short Term Goal #2   Patient will review EMMI information provided to her by CSW, pertaining specifically to "Signs & Symptoms of Depression", within the next two weeks.  THN CM Short Term Goal #2 Start Date  01/14/18  [facility] CM Short Term Goal #3  Patient will practice deep breathing exercises and relaxation techniques to help better cope with symptoms of depression, within the next 30 days.  THN CM Short Term Goal #3 Start Date  01/14/18     [name], BSW, MSW, [place]  Licensed Clinical Social Worker  [place]  [facility] [place] [facility]  Mailing Address-[address], [place], [place] [address] Physical Address-[address], [place], [place] [address] Toll Free Main # [phone] Fax # [phone] Cell # [phone]  Office # [phone] [name]

## 2018-02-02 ENCOUNTER — Other Ambulatory Visit: Payer: Self-pay | Admitting: Hematology

## 2018-02-02 DIAGNOSIS — D472 Monoclonal gammopathy: Secondary | ICD-10-CM

## 2018-02-02 DIAGNOSIS — D649 Anemia, unspecified: Secondary | ICD-10-CM

## 2018-02-02 DIAGNOSIS — F411 Generalized anxiety disorder: Secondary | ICD-10-CM | POA: Diagnosis not present

## 2018-02-02 DIAGNOSIS — R4189 Other symptoms and signs involving cognitive functions and awareness: Secondary | ICD-10-CM | POA: Diagnosis not present

## 2018-02-02 DIAGNOSIS — F329 Major depressive disorder, single episode, unspecified: Secondary | ICD-10-CM | POA: Diagnosis not present

## 2018-02-02 NOTE — Progress Notes (Signed)
[place] NOTE  Patient Care Team: Benito Mccreedy, MD as PCP - General (Internal Medicine) Leona Singleton, RN as [facility] [place], LCSW as [facility]  HEME/ONC OVERVIEW: 1. IgA MGUS -M-spike 0.4 g/dL, IgA 566 during routine nephrology evaluation (Cr 1.0-1.2; Ca 10.2, Hgb ~11 w/ MCV 88)   2. Normocytic anemia (Hgb ~11)  ASSESSMENT & PLAN:   IgA MGUS -I reviewed the patient's external documents extensively, including nephrology clinic notes and the lab studies -Briefly, patient was referred to nephrology for evaluation of borderline elevated creatinine (creatinine 1.0-1.2), and was found with an incidental mildly elevated M-spike -Given normal calcium and relatively stable mild anemia, this most likely represents MGUS -I have ordered SPEP, IFE, UPEP, UFE and serum free light chain -In addition, I have also ordered whole-body PET scan to assess for any myeloma-related bone lesions -Based on the work-up above, will determine if patient will need bone marrow biopsy  Recent normocytic anemia -I reviewed external labs, which showed normocytic anemia with Hgb ~11 -Hgb normal today; I reviewed the peripheral blood smear, which showed relatively normal RBC morphology -I have ordered iron profile, B12, folate, epo level -Patient has not had colonoscopy in the past, and I will refer patient to gastroenterology for age-appropriate cancer screening, including colonoscopy and EGD as indicated  Thrombocytopenia -External labs showed normal platelet count in September 2019 -Plt count 129k today -Patient denies any abnormal bleeding or bruising -I reviewed the peripheral blood smear, which showed intermittent platelet clumping, likely accounting for decreased platelet count -We will repeat labs at next visit to monitor the platelet count, and determine if further work-up is indicated  Orders  Placed This Encounter  Procedures  . NM PET Image Initial (PI) Whole Body    Standing Status:   Future    Standing Expiration Date:   02/05/2019    Order Specific Question:   If indicated for the ordered procedure, I authorize the administration of a radiopharmaceutical per Radiology protocol    Answer:   Yes    Order Specific Question:   Preferred imaging location?    Answer:   [facility]    Order Specific Question:   Radiology Contrast Protocol - do NOT remove file path    Answer:   [email or web address]    Order Specific Question:   Is the patient pregnant?    Answer:   No  . CBC with Differential (Cancer Center Only)    Standing Status:   Future    Standing Expiration Date:   03/12/2019  . CMP ([place] only)    Standing Status:   Future    Standing Expiration Date:   03/12/2019  . Ambulatory referral to Gastroenterology    Referral Priority:   Routine    Referral Type:   Consultation    Referral Reason:   Specialty Services Required    Number of Visits Requested:   1    All questions were answered. The patient knows to call the clinic with any problems, questions or concerns.  A total of more than 45 minutes were spent face-to-face with the patient during this encounter and over half of that time was spent on counseling and coordination of care as outlined above.   Return on 02/17/2018 for labs, imaging results and clinic follow-up.  Tish Men, MD 02/05/18 1:03 PM   CHIEF COMPLAINTS/PURPOSE OF CONSULTATION:  "I don't know why I am here"  HISTORY  OF PRESENTING ILLNESS:  Nichole Garza 57 y.o. female is here because of IgA MGUS. She was undergoing work-up for borderline elevated creatinine (1.0-1.2), and was found with an incidental M spike of 0.4 g/dL and mildly elevated IgA level in early October 2019. She reports history of fibromyalgia with generalized, nonspecific muscle and bone achiness, which is chronic and unchanged.    She reports mild neuropathy in bilateral lower extremities, left greater than right, which she attributes to her diabetes. The patient denies history of recurrent infection or require frequent antibiotics. She denies unexplained fever, chills, night sweats, anorexia, weight loss, or abnormal bleeding or bruising.   Of note, patient has a trip planned to go to [place] from February 27, 2018 to April 15, 2018.  MEDICAL HISTORY:  Past Medical History:  Diagnosis Date  . Arthritis    in her spine  . Diabetes mellitus   . Fibromyalgia   . Hypertension   . Migraine headache   . Nerve pain   . Renal disorder    kidney stone  . TIA (transient ischemic attack)     SURGICAL HISTORY: Past Surgical History:  Procedure Laterality Date  . ABDOMINAL HYSTERECTOMY  2012  . CYSTOSCOPY WITH RETROGRADE PYELOGRAM, URETEROSCOPY AND STENT PLACEMENT Right 05/29/2012   Procedure: CYSTOSCOPY WITH RETROGRADE PYELOGRAM, URETEROSCOPY AND STENT PLACEMENT;  Surgeon: Hanley Ben, MD;  Location: [facility];  Service: Urology;  Laterality: Right;  . HOLMIUM LASER APPLICATION Right 6/46/8032   Procedure: HOLMIUM LASER APPLICATION;  Surgeon: Hanley Ben, MD;  Location: [facility];  Service: Urology;  Laterality: Right;    SOCIAL HISTORY: Social History   Socioeconomic History  . Marital status: Married    Spouse name: Archie Endo  . Number of children: 1  . Years of education: Not on file  . Highest education level: Not on file  Occupational History  . Occupation: Chartered certified accountant    Employer: [employer]  . Financial resource strain: Not on file  . Food insecurity:    Worry: Not on file    Inability: Not on file  . Transportation needs:    Medical: Not on file    Non-medical: Not on file  Tobacco Use  . Smoking status: Never Smoker  . Smokeless tobacco: Never Used  Substance and Sexual Activity  . Alcohol use: No  . Drug use: No  . Sexual activity: Yes    Birth  control/protection: Surgical  Lifestyle  . Physical activity:    Days per week: Not on file    Minutes per session: Not on file  . Stress: Not on file  Relationships  . Social connections:    Talks on phone: Not on file    Gets together: Not on file    Attends religious service: Not on file    Active member of club or organization: Not on file    Attends meetings of clubs or organizations: Not on file    Relationship status: Not on file  . Intimate partner violence:    Fear of current or ex partner: Not on file    Emotionally abused: Not on file    Physically abused: Not on file    Forced sexual activity: Not on file  Other Topics Concern  . Not on file  Social History Narrative   Married.  Lives with husband.  Works as a Quarry manager.    FAMILY HISTORY: Family History  Problem Relation Age of Onset  . Diabetes Mother   .  Diabetes Father   . Cancer Brother     ALLERGIES:  is allergic to enalapril; hydrocodone; latex; and relpax [eletriptan].  MEDICATIONS:  Current Outpatient Medications  Medication Sig Dispense Refill  . amLODipine (NORVASC) 5 MG tablet Take 5 mg by mouth daily.    Marland Kitchen aspirin EC 81 MG tablet Take 81 mg by mouth every morning.    . Black Cohosh 20 MG TABS Take by mouth.    . DULoxetine (CYMBALTA) 30 MG capsule Take 30 mg by mouth daily.    . fish oil-omega-3 fatty acids 1000 MG capsule Take 1 g by mouth 3 (three) times daily.     Marland Kitchen gabapentin (NEURONTIN) 800 MG tablet Take 800 mg by mouth 3 (three) times daily.    Marland Kitchen GINKGO BILOBA COMPLEX PO Take 1 capsule by mouth daily.     Marland Kitchen glimepiride (AMARYL) 4 MG tablet Take 1 tablet by mouth 2 (two) times daily.  1  . metFORMIN (GLUCOPHAGE) 500 MG tablet Take 500 mg by mouth 2 (two) times daily with a meal.    . Misc Natural Products (COLON CLEANSE) CAPS Take 1 capsule by mouth 2 (two) times daily.    . Multiple Vitamin (MULTIVITAMIN WITH MINERALS) TABS Take 1 tablet by mouth daily.    . pravastatin (PRAVACHOL) 10 MG  tablet Take 10 mg by mouth at bedtime.    Marland Kitchen tiZANidine (ZANAFLEX) 4 MG capsule Take 4 mg by mouth daily. Take 2 tablets once daily    . vitamin C (ASCORBIC ACID) 500 MG tablet Take 500 mg by mouth daily.    . propranolol (INDERAL) 60 MG tablet Take 60 mg by mouth 2 (two) times daily.     No current facility-administered medications for this visit.     REVIEW OF SYSTEMS:   Constitutional: ( - ) fevers, ( - )  chills , ( - ) night sweats Eyes: ( - ) blurriness of vision, ( - ) double vision, ( - ) watery eyes Ears, nose, mouth, throat, and face: ( - ) mucositis, ( - ) sore throat Respiratory: ( - ) cough, ( - ) dyspnea, ( - ) wheezes Cardiovascular: ( - ) palpitation, ( - ) chest discomfort, ( - ) lower extremity swelling Gastrointestinal:  ( - ) nausea, ( - ) heartburn, ( - ) change in bowel habits Skin: ( - ) abnormal skin rashes Lymphatics: ( - ) new lymphadenopathy, ( - ) easy bruising Neurological: ( - ) numbness, ( - ) tingling, ( - ) new weaknesses Behavioral/Psych: ( - ) mood change, ( - ) new changes  All other systems were reviewed with the patient and are negative.  PHYSICAL EXAMINATION: ECOG PERFORMANCE STATUS: 1 - Symptomatic but completely ambulatory  Temperature 98.6 BP 148/84 HR 71 O2 saturation 100% on room air  GENERAL: alert, no distress and comfortable, thin SKIN: skin color, texture, turgor are normal, no rashes or significant lesions EYES: conjunctiva are pink and non-injected, sclera clear OROPHARYNX: no exudate, no erythema; lips, buccal mucosa, and tongue normal  NECK: supple, non-tender LYMPH:  no palpable lymphadenopathy in the cervical or axillary  LUNGS: clear to auscultation and percussion with normal breathing effort HEART: regular rate & rhythm and no murmurs and no lower extremity edema ABDOMEN: soft, non-tender, non-distended, normal bowel sounds Musculoskeletal: no cyanosis of digits and no clubbing  PSYCH: alert & oriented x 3, fluent  speech NEURO: no focal motor/sensory deficits  LABORATORY DATA:  I have reviewed the data as  listed Lab Results  Component Value Date   WBC 5.6 02/05/2018   HGB 12.6 02/05/2018   HCT 41.4 02/05/2018   MCV 88.8 02/05/2018   PLT 129 (L) 02/05/2018

## 2018-02-05 ENCOUNTER — Inpatient Hospital Stay: Payer: PPO

## 2018-02-05 ENCOUNTER — Inpatient Hospital Stay: Payer: PPO | Attending: Hematology | Admitting: Hematology

## 2018-02-05 ENCOUNTER — Other Ambulatory Visit: Payer: Self-pay | Admitting: *Deleted

## 2018-02-05 DIAGNOSIS — Z809 Family history of malignant neoplasm, unspecified: Secondary | ICD-10-CM | POA: Diagnosis not present

## 2018-02-05 DIAGNOSIS — D696 Thrombocytopenia, unspecified: Secondary | ICD-10-CM

## 2018-02-05 DIAGNOSIS — D472 Monoclonal gammopathy: Secondary | ICD-10-CM | POA: Diagnosis not present

## 2018-02-05 DIAGNOSIS — D649 Anemia, unspecified: Secondary | ICD-10-CM

## 2018-02-05 LAB — CMP (CANCER CENTER ONLY)
ALT: 21 U/L (ref 10–47)
ANION GAP: 2 — AB (ref 5–15)
AST: 25 U/L (ref 11–38)
Albumin: 4.2 g/dL (ref 3.5–5.0)
Alkaline Phosphatase: 147 U/L — ABNORMAL HIGH (ref 26–84)
BILIRUBIN TOTAL: 0.5 mg/dL (ref 0.2–1.6)
BUN: 13 mg/dL (ref 7–22)
CALCIUM: 9.6 mg/dL (ref 8.0–10.3)
CO2: 28 mmol/L (ref 18–33)
Chloride: 104 mmol/L (ref 98–108)
Creatinine: 1 mg/dL (ref 0.60–1.20)
Glucose, Bld: 175 mg/dL — ABNORMAL HIGH (ref 73–118)
Potassium: 4.2 mmol/L (ref 3.3–4.7)
Sodium: 134 mmol/L (ref 128–145)
Total Protein: 8.3 g/dL — ABNORMAL HIGH (ref 6.4–8.1)

## 2018-02-05 LAB — VITAMIN B12: Vitamin B-12: 1249 pg/mL — ABNORMAL HIGH (ref 180–914)

## 2018-02-05 LAB — CBC WITH DIFFERENTIAL (CANCER CENTER ONLY)
Abs Immature Granulocytes: 0.02 10*3/uL (ref 0.00–0.07)
Basophils Absolute: 0.1 10*3/uL (ref 0.0–0.1)
Basophils Relative: 1 %
Eosinophils Absolute: 0.2 10*3/uL (ref 0.0–0.5)
Eosinophils Relative: 3 %
HCT: 41.4 % (ref 36.0–46.0)
HEMOGLOBIN: 12.6 g/dL (ref 12.0–15.0)
Immature Granulocytes: 0 %
LYMPHS ABS: 2.6 10*3/uL (ref 0.7–4.0)
LYMPHS PCT: 48 %
MCH: 27 pg (ref 26.0–34.0)
MCHC: 30.4 g/dL (ref 30.0–36.0)
MCV: 88.8 fL (ref 80.0–100.0)
Monocytes Absolute: 0.3 10*3/uL (ref 0.1–1.0)
Monocytes Relative: 5 %
Neutro Abs: 2.4 10*3/uL (ref 1.7–7.7)
Neutrophils Relative %: 43 %
Platelet Count: 129 10*3/uL — ABNORMAL LOW (ref 150–400)
RBC: 4.66 MIL/uL (ref 3.87–5.11)
RDW: 13.1 % (ref 11.5–15.5)
WBC Count: 5.6 10*3/uL (ref 4.0–10.5)
nRBC: 0 % (ref 0.0–0.2)

## 2018-02-05 LAB — LACTATE DEHYDROGENASE: LDH: 183 U/L (ref 98–192)

## 2018-02-05 LAB — FOLATE: Folate: 30.7 ng/mL (ref >5.9)

## 2018-02-05 LAB — SAVE SMEAR(SSMR), FOR PROVIDER SLIDE REVIEW

## 2018-02-05 NOTE — Patient Outreach (Signed)
[facility] Care Management  02/05/2018  Cadi Shaw-Falconer 09-01-1960 [id number]   CSW was able to meet with patient today to perform a routine home visit.  Patient admitted that she was experiencing a great deal of pain today, due to Fibromyalgia.  Patient indicated that she had just taken her pain medication before CSW's arrival, so she was waiting for the medication to take effect.  Patient reported that she continues to perform her daily exercise routine of stretching and walking, despite the pain.  Patient admitted that she also tries to drink plenty of water throughout the day to stay hydrated.  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  Patient believes this is very good for her mental health, because she enjoys helping others and it takes her mind off of her own problems.  Patient's husband, Archie Endo is a truck driver and currently works for two weeks at a time, is home for two nights, then goes back on the road for another two weeks.  Mr. Jeanene Erb is trying to find a job that is local to be more available to patient.  Patient was excited to inform CSW that she has recently joined a "friend group" from her neighborhood, which meets every Tuesday at 4:00 PM at [facility].  Patient reported that she already has several "outings" scheduled and that she really enjoys getting together with this group of ladies.  Patient indicated that she is trying to stay active and engaged in the community, wanting to find activities that keep her busy.  Patient stated that she plans to attend the Fibromyalgia Support Group, titled:  FibroMyWay, which takes place this evening from 6:30 PM to 8:30 PM at [facility] [place], [place]).    CSW was able to provide counseling and supportive services to patient while she talked about her health concerns and limitations.  CSW was  also able to review EMMI information with patient pertaining specifically to "Signs and Symptoms of Depression" to ensure understanding.  Patient admitted that the best treatment for her thus far has been adopting her rescue/pet therapy dog named Muffin.  It is apparent that patient has grown very attached to Muffin and relies heavily on Muffin for healing and companionship.  CSW agreed to meet with patient again on Tuesday, February 10, 2018 at 10:00 AM.  CSW agreed to teach patient proper use of deep breathing exercises and relaxation techniques for patient to use to help reduce or eliminate symptoms of anxiety and depression.  Patient voiced understanding and was agreeable to this plan.  Patient was encouraged to continue to take her antidepressant/antianxiety medication, Cymbalta exactly as prescribed, as patient believes that the medication helps to "take the edge off of things".  THN CM Care Plan Problem One     Most Recent Value  Care Plan Problem One  Patient is experiencing symptoms of depression.  Role Documenting the Problem One  Clinical Social Worker  Care Plan for Problem One  Active  [facility] Long Term Goal   Patient will have a decrease in symptoms of depression, after receiving counseling and supportive services through CSW, within the next 45 days.  THN Long Term Goal Start Date  01/14/18  [facility] CM Short Term Goal #1   Patient will meet with CSW for an initial home visit to discuss symptoms of depression so that CSW can provide counseling and supportive services, within the next  10 days.  THN CM Short Term Goal #1 Start Date  01/14/18  [facility]N CM Short Term Goal #1 Met Date  01/22/18  THN CM Short Term Goal #2   Patient will review EMMI information provided to her by CSW, pertaining specifically to "Signs & Symptoms of Depression", within the next two weeks.  THN CM Short Term Goal #2 Start Date  01/14/18  [facility] CM Short Term Goal #2 Met Date  02/05/18  Interventions for Short Term Goal #2  CSW  was able to review EMMI information with patient regarding "Signs and Symptoms of Depression" to ensure understanding.  THN CM Short Term Goal #3  Patient will practice deep breathing exercises and relaxation techniques to help better cope with symptoms of depression, within the next 30 days.  THN CM Short Term Goal #3 Start Date  01/14/18     [name], BSW, MSW, [place]  Licensed Clinical Social Worker  [place]  Mailing [place] [address], [place], [place] [address] Physical Address-[address], [place], [place] [address] Toll Free Main # [phone] Fax # [phone] Cell # [phone]  Office # [phone] [name]

## 2018-02-06 LAB — FERRITIN: FERRITIN: 17 ng/mL (ref 11–307)

## 2018-02-06 LAB — VITAMIN D 25 HYDROXY (VIT D DEFICIENCY, FRACTURES): Vit D, 25-Hydroxy: 52.9 ng/mL (ref 30.0–100.0)

## 2018-02-06 LAB — IRON AND TIBC
Iron: 69 ug/dL (ref 41–142)
SATURATION RATIOS: 18 % — AB (ref 21–57)
TIBC: 373 ug/dL (ref 236–444)
UIBC: 304 ug/dL (ref 120–384)

## 2018-02-06 LAB — BETA 2 MICROGLOBULIN, SERUM: Beta-2 Microglobulin: 1.3 mg/L (ref 0.6–2.4)

## 2018-02-09 LAB — MULTIPLE MYELOMA PANEL, SERUM
ALBUMIN SERPL ELPH-MCNC: 4.1 g/dL (ref 2.9–4.4)
ALBUMIN/GLOB SERPL: 1.3 (ref 0.7–1.7)
Alpha 1: 0.2 g/dL (ref 0.0–0.4)
Alpha2 Glob SerPl Elph-Mcnc: 0.8 g/dL (ref 0.4–1.0)
B-GLOBULIN SERPL ELPH-MCNC: 1.4 g/dL — AB (ref 0.7–1.3)
GAMMA GLOB SERPL ELPH-MCNC: 0.9 g/dL (ref 0.4–1.8)
GLOBULIN, TOTAL: 3.2 g/dL (ref 2.2–3.9)
IgA: 585 mg/dL — ABNORMAL HIGH (ref 87–352)
IgG (Immunoglobin G), Serum: 953 mg/dL (ref 700–1600)
IgM (Immunoglobulin M), Srm: 43 mg/dL (ref 26–217)
M Protein SerPl Elph-Mcnc: 0.5 g/dL — ABNORMAL HIGH
Total Protein ELP: 7.3 g/dL (ref 6.0–8.5)

## 2018-02-10 ENCOUNTER — Other Ambulatory Visit: Payer: Self-pay | Admitting: *Deleted

## 2018-02-10 NOTE — Patient Outreach (Signed)
[facility]) Care Management  02/10/2018  Johan Shaw-Falconer 20-Jul-1960 [id number]  CSW was able to meet with patient today to perform a routine home visit.  Patient complained of having a headache since Sunday, February 08, 2018, with minimal relief from pain medications.  Patient indicated that she is just trying to relax and keep her eyes closed.  Patient also admits to experiencing a great deal of Fibromyalgia pain, especially due to the climate change.  Patient reported that she plans to stay at home all day today, not feeling well enough to go out.  Patient also admits to feeling anxious about her upcoming appointment at the [facility] at [facility], not sure what she will find out, but praying that she does not have cancer (Multiple Myeloma).  CSW offered a great deal of counseling and supportive services to patient today. Patient reported that she went to see Dr. Tish Men, Oncologist at the [facility] at [facility] on Thursday, February 05, 2018.  Patient was undergoing work-up for borderline elevated creatinine (1.0-1.2), and was found to have an incidental M spike of 0.4 g/dL and mildly elevated IgA level in early October of 2019.  According to Dr. Maylon Peppers, given normal calcium and relatively stable mild anemia, this most likely represents MGUS.  "Monoclonal Gammopathy of Undetermined Significance (MGUS) is a condition in which an abnormal protein - known as monoclonal protein or M protein - is in your blood. This abnormal protein is formed within your bone marrow, the soft, blood-producing tissue that fills in the center of most of your bones". Dr. Maylon Peppers has ordered a whole-body PET scan to assess for any myeloma-related bone lesions.  Based on work-up, Dr. Maylon Peppers will determine if patient will need a bone marrow biopsy.  Patient's PET scan is scheduled for Friday, February 13, 2018 at 9:30 AM and then patient will follow-up with Dr. Maylon Peppers on Tuesday,  February 17, 2018 at 10:00 AM to receive all her test results.  CSW agreed to be present for this appointment to provide counseling and supportive services, in the event that patient does not receive favorable results.  CSW explained to patient that CSW will plan to meet with patient at the [facility] in [facility] on Tuesday, February 17, 2018 at 10:00 AM.  Patient is hopeful that her husband, Archie Endo will also be able to attend this appointment with patient. CSW taught patient proper use of deep breathing exercises and relaxation techniques to utilize when she experiences symptoms of anxiety and/or depression.  Patient admitted that she is already using deep breathing exercises to help calm her nerves when she is experiencing Fibromyalgia pain.  Patient denied being able to attend the Fibromyalgia Support Group, titled:FibroMyWay, which took place last Thursday evening from 6:30 PM to 8:30 PM at [facility], as she admitted that she was too overwhelmed with everything she learned from her appointment at the [facility] to be able to focus on anything else.  CSW encouraged patient to contact CSW directly if she has questions and/or concerns or just needs to talk with someone in the meantime.  THN CM Care Plan Problem One     Most Recent Value  Care Plan Problem One  Patient is experiencing symptoms of depression.  Role Documenting the Problem One  Clinical Social Worker  Care Plan for Problem One  Active  [facility] Long Term Goal   Patient will have a decrease in symptoms  of depression, after receiving counseling and supportive services through CSW, within the next 45 days.  THN Long Term Goal Start Date  01/14/18  [facility] CM Short Term Goal #1   Patient will meet with CSW for an initial home visit to discuss symptoms of depression so that CSW can provide counseling and supportive services, within the next 10 days.  THN CM Short Term Goal #1 Start Date  01/14/18  THN  CM Short Term Goal #1 Met Date  01/22/18  THN CM Short Term Goal #2   Patient will review EMMI information provided to her by CSW, pertaining specifically to "Signs & Symptoms of Depression", within the next two weeks.  THN CM Short Term Goal #2 Start Date  01/14/18  [facility] CM Short Term Goal #2 Met Date  02/05/18  [facility]N CM Short Term Goal #3  Patient will practice deep breathing exercises and relaxation techniques to help better cope with symptoms of depression, within the next 30 days.  THN CM Short Term Goal #3 Start Date  01/14/18  [facility] CM Short Term Goal #3 Met Date  02/10/18  Interventions for Short Tern Goal #3  Patient has learned proper use of deep breathing exercises and relaxation techniques to help reduce symptoms of anxiety and depression.     [name], BSW, MSW, LCSW  Licensed Education officer, [facility]  Mailing [place] [address] [place], [place] [address] Physical Address-[address], [place], [place] [address] Toll Free Main # [phone] Fax # [phone] Cell # [phone]  Office # [phone] [name]

## 2018-02-12 DIAGNOSIS — D472 Monoclonal gammopathy: Secondary | ICD-10-CM | POA: Diagnosis not present

## 2018-02-13 ENCOUNTER — Encounter (HOSPITAL_COMMUNITY)
Admission: RE | Admit: 2018-02-13 | Discharge: 2018-02-13 | Disposition: A | Discharge status: Home / Self Care | Payer: PPO | Source: Ambulatory Visit | Attending: Hematology | Admitting: Hematology

## 2018-02-13 DIAGNOSIS — D472 Monoclonal gammopathy: Secondary | ICD-10-CM | POA: Diagnosis not present

## 2018-02-13 LAB — GLUCOSE, CAPILLARY: Glucose-Capillary: 129 mg/dL — ABNORMAL HIGH (ref 70–99)

## 2018-02-13 MED ORDER — FLUDEOXYGLUCOSE F - 18 (FDG) INJECTION
5.6000 | Freq: Once | INTRAVENOUS | Status: AC
  Administered 2018-02-13: 5.6 via INTRAVENOUS

## 2018-02-14 LAB — UPEP/UIFE/LIGHT CHAINS/TP, 24-HR UR
% BETA, Urine: 0 %
ALBUMIN, U: 0 %
ALPHA 1 URINE: 0 %
Alpha 2, Urine: 0 %
Free Kappa Lt Chains,Ur: 21.6 mg/L (ref 1.35–24.19)
Free Kappa/Lambda Ratio: 15 — ABNORMAL HIGH (ref 2.04–10.37)
Free Lambda Lt Chains,Ur: 1.44 mg/L (ref 0.24–6.66)
GAMMA GLOBULIN URINE: 0 %
Total Protein, Urine-Ur/day: <94 mg/24 hr (ref 30–150)
Total Protein, Urine: <4 mg/dL
Total Volume: 2350

## 2018-02-16 ENCOUNTER — Other Ambulatory Visit: Payer: Self-pay | Admitting: Hematology

## 2018-02-16 DIAGNOSIS — D472 Monoclonal gammopathy: Secondary | ICD-10-CM

## 2018-02-16 NOTE — Progress Notes (Signed)
Nichole Garza OFFICE PROGRESS NOTE  Patient Care Team: Benito Mccreedy, MD as PCP - General (Internal Medicine) Leona Singleton, RN as [facility] [place], LCSW as [facility]  HEME/ONC OVERVIEW: 1. IgA MGUS, light chain pending -M-spike 0.4 g/dL, IgA 566 during routine nephrology evaluation (Cr 1.0-1.2; Ca 10.2, Hgb ~11 w/ MCV 88)  -01/2018: baseline labs   -M-spike 0.5 g/dL, IgA 585, serum free light chains pending  -UPEP, UFE negative, urine free light chains normal  -PET negative for bony lesions or plasmacytoma  -Hgb, calcium, creatinine normal   ASSESSMENT & PLAN:   IgA MGUS -I independently reviewed the radiologic images of PET, and agree with findings as documented -In summary, PET did not show any abnormal bony lesions or plasmacytoma to suggest multiple myeloma -SPEP showed IgA monoclonal gammopathy, with free light chains pending  -Patient does not meet any CRAB criteria; given the isolated M-spike, this is consistent with MGUS -In the absence of suspicious features, bone marrow biopsy is likely low yield -Therefore, we will plan to monitor the patient's M-spike every 3 months to assess for any interim changes  Hx of normocytic anemia -Patient had a history of borderline normocytic anemia  -B12, folate, and iron profile normal except borderline low ferritin -Hgb normal during the last visit and today  -Patient has been referred to gastroenterology for age-appropriate cancer screening, including colonoscopy  Thrombocytopenia -External labs showed normal platelet count in September 2019 -Plt count 129k in early 01/2018; patient denies any abnormal bleeding or bruising -I reviewed the peripheral blood smear at the last visit, which showed intermittent platelet clumping, likely accounting for decreased platelet count -Plt count normal today (306k) -No further work-up indicated   Orders  Placed This Encounter  Procedures  . CBC with Differential (Cancer Center Only)    Standing Status:   Future    Standing Expiration Date:   03/24/2019  . CMP ([facility] only)    Standing Status:   Future    Standing Expiration Date:   03/24/2019  . Protein electrophoresis, serum    Standing Status:   Future    Standing Expiration Date:   03/24/2019  . Immunofixation electrophoresis    Standing Status:   Future    Standing Expiration Date:   03/24/2019  . Kappa/lambda light chains    Standing Status:   Future    Standing Expiration Date:   03/24/2019  . IgG, IgA, IgM    Standing Status:   Future    Standing Expiration Date:   03/24/2019   All questions were answered. The patient knows to call the clinic with any problems, questions or concerns. No barriers to learning was detected.  A total of more than 25 minutes were spent face-to-face with the patient during this encounter and over half of that time was spent on counseling and coordination of care as outlined above.   Return in 3 months for labs and clinic follow-up.  Tish Men, MD 02/17/2018 10:40 AM  CHIEF COMPLAINT: "I am here for my labs"  INTERVAL HISTORY: Nichole Garza returns to clinic for follow-up myeloma labs and PET scan results.  She reports doing well, except generalized muscle achiness, which she attributes to fibromyalgia.  She denies any fever, chill, weight change, night sweats, lymphadenopathy, chest pain, dyspnea, abdominal pain, nausea, vomiting, or diarrhea.  REVIEW OF SYSTEMS:   Constitutional: ( - ) fevers, ( - )  chills , ( - )  night sweats Eyes: ( - ) blurriness of vision, ( - ) double vision, ( - ) watery eyes Ears, nose, mouth, throat, and face: ( - ) mucositis, ( - ) sore throat Respiratory: ( - ) cough, ( - ) dyspnea, ( - ) wheezes Cardiovascular: ( - ) palpitation, ( - ) chest discomfort, ( - ) lower extremity swelling Gastrointestinal:  ( - ) nausea, ( - ) heartburn, ( - ) change in bowel  habits Skin: ( - ) abnormal skin rashes Lymphatics: ( - ) new lymphadenopathy, ( - ) easy bruising Neurological: ( - ) numbness, ( - ) tingling, ( - ) new weaknesses Behavioral/Psych: ( - ) mood change, ( - ) new changes  All other systems were reviewed with the patient and are negative.  I have reviewed the past medical history, past surgical history, social history and family history with the patient and they are unchanged from previous note.  ALLERGIES:  is allergic to enalapril; hydrocodone; latex; and relpax [eletriptan].  MEDICATIONS:  Current Outpatient Medications  Medication Sig Dispense Refill  . amLODipine (NORVASC) 5 MG tablet Take 5 mg by mouth daily.    Marland Kitchen aspirin EC 81 MG tablet Take 81 mg by mouth every morning.    . Black Cohosh 20 MG TABS Take by mouth.    . DULoxetine (CYMBALTA) 30 MG capsule Take 30 mg by mouth daily.    . fish oil-omega-3 fatty acids 1000 MG capsule Take 1 g by mouth 3 (three) times daily.     Marland Kitchen gabapentin (NEURONTIN) 800 MG tablet Take 800 mg by mouth 3 (three) times daily.    Marland Kitchen GINKGO BILOBA COMPLEX PO Take 1 capsule by mouth daily.     Marland Kitchen glimepiride (AMARYL) 4 MG tablet Take 1 tablet by mouth 2 (two) times daily.  1  . metFORMIN (GLUCOPHAGE) 500 MG tablet Take 500 mg by mouth 2 (two) times daily with a meal.    . Misc Natural Products (COLON CLEANSE) CAPS Take 1 capsule by mouth 2 (two) times daily.    . Multiple Vitamin (MULTIVITAMIN WITH MINERALS) TABS Take 1 tablet by mouth daily.    . pravastatin (PRAVACHOL) 10 MG tablet Take 10 mg by mouth at bedtime.    Marland Kitchen tiZANidine (ZANAFLEX) 4 MG capsule Take 4 mg by mouth daily. Take 2 tablets once daily    . vitamin C (ASCORBIC ACID) 500 MG tablet Take 500 mg by mouth daily.    . propranolol (INDERAL) 60 MG tablet Take 60 mg by mouth 2 (two) times daily.     No current facility-administered medications for this visit.     PHYSICAL EXAMINATION: ECOG PERFORMANCE STATUS: 1 - Symptomatic but completely  ambulatory  Today's Vitals   02/17/18 1038  BP: (!) 144/84  Pulse: 99  Resp: 18  Temp: 98.7 F (37.1 C)  TempSrc: Oral  SpO2: 100%  Weight: 114 lb 12.8 oz (52.1 kg)  Height: 5' (1.524 m)  PainSc: 0-No pain   Body mass index is 22.42 kg/m.  Filed Weights   02/17/18 1038  Weight: 114 lb 12.8 oz (52.1 kg)    GENERAL: alert, no distress and comfortable, thin SKIN: skin color, texture, turgor are normal, no rashes or significant lesions EYES: conjunctiva are pink and non-injected, sclera clear OROPHARYNX: no exudate, no erythema; lips, buccal mucosa, and tongue normal  NECK: supple, non-tender LYMPH:  no palpable lymphadenopathy in the cervical or axillary  LUNGS: clear to auscultation and percussion with normal  breathing effort HEART: regular rate & rhythm and no murmurs and no lower extremity edema ABDOMEN: soft, non-tender, non-distended, normal bowel sounds Musculoskeletal: no cyanosis of digits and no clubbing  PSYCH: alert & oriented x 3, fluent speech NEURO: no focal motor/sensory deficits  LABORATORY DATA:  I have reviewed the data as listed    Component Value Date/Time   NA 134 02/05/2018 1148   K 4.2 02/05/2018 1148   CL 104 02/05/2018 1148   CO2 28 02/05/2018 1148   GLUCOSE 175 (H) 02/05/2018 1148   BUN 13 02/05/2018 1148   CREATININE 1.00 02/05/2018 1148   CALCIUM 9.6 02/05/2018 1148   PROT 8.3 (H) 02/05/2018 1148   ALBUMIN 4.2 02/05/2018 1148   AST 25 02/05/2018 1148   ALT 21 02/05/2018 1148   ALKPHOS 147 (H) 02/05/2018 1148   BILITOT 0.5 02/05/2018 1148   GFRNONAA 75 (L) 02/15/2014 0534   GFRAA 87 (L) 02/15/2014 0534    No results found for: SPEP, UPEP  Lab Results  Component Value Date   WBC 6.0 02/17/2018   NEUTROABS 2.9 02/17/2018   HGB 12.2 02/17/2018   HCT 39.5 02/17/2018   MCV 89.8 02/17/2018   PLT 306 02/17/2018      Chemistry      Component Value Date/Time   NA 134 02/05/2018 1148   K 4.2 02/05/2018 1148   CL 104 02/05/2018  1148   CO2 28 02/05/2018 1148   BUN 13 02/05/2018 1148   CREATININE 1.00 02/05/2018 1148      Component Value Date/Time   CALCIUM 9.6 02/05/2018 1148   ALKPHOS 147 (H) 02/05/2018 1148   AST 25 02/05/2018 1148   ALT 21 02/05/2018 1148   BILITOT 0.5 02/05/2018 1148       RADIOGRAPHIC STUDIES: I have personally reviewed the radiological images as listed below and agreed with the findings in the report. Nm Pet Image Initial (pi) Whole Body  Result Date: 02/13/2018 CLINICAL DATA:  Initial treatment strategy for Monoclonal gammopathy of undetermined significance. EXAM: NUCLEAR MEDICINE PET WHOLE BODY TECHNIQUE: 5.6 mCi F-18 FDG was injected intravenously. Full-ring PET imaging was performed from the skull base to thigh after the radiotracer. CT data was obtained and used for attenuation correction and anatomic localization. Fasting blood glucose: 129 mg/dl COMPARISON:  None. FINDINGS: Mediastinal blood pool activity: SUV max 2.1 HEAD/NECK: No hypermetabolic activity in the calvarium. No hypermetabolic cervical lymph nodes. Incidental CT findings: none CHEST: No hypermetabolic mediastinal or hilar nodes. No suspicious pulmonary nodules on the CT scan. Incidental CT findings: none ABDOMEN/PELVIS: No abnormal hypermetabolic activity within the liver, pancreas, adrenal glands, or spleen. No hypermetabolic lymph nodes in the abdomen or pelvis. Incidental CT findings: Post hysterectomy SKELETON: No focal hypermetabolic activity to suggest skeletal metastasis. Incidental CT findings: none EXTREMITIES: No abnormal hypermetabolic activity in the lower extremities. Incidental CT findings: none IMPRESSION: 1. No hypermetabolic lesion in skeleton to suggest multiple myeloma. 2. No soft tissue mass lesion to suggest plasmacytoma. 3. No lytic lesion identified on the CT portion exam. Electronically Signed   By: Suzy Bouchard M.D.   On: 02/13/2018 15:47

## 2018-02-17 ENCOUNTER — Inpatient Hospital Stay: Payer: PPO

## 2018-02-17 ENCOUNTER — Inpatient Hospital Stay (HOSPITAL_BASED_OUTPATIENT_CLINIC_OR_DEPARTMENT_OTHER): Payer: PPO | Admitting: Hematology

## 2018-02-17 ENCOUNTER — Other Ambulatory Visit: Payer: Self-pay | Admitting: *Deleted

## 2018-02-17 ENCOUNTER — Encounter: Payer: Self-pay | Admitting: Hematology

## 2018-02-17 VITALS — BP 144/84 | HR 99 | Temp 98.7°F | Resp 18 | Ht 60.0 in | Wt 114.8 lb

## 2018-02-17 DIAGNOSIS — D696 Thrombocytopenia, unspecified: Secondary | ICD-10-CM

## 2018-02-17 DIAGNOSIS — Z862 Personal history of diseases of the blood and blood-forming organs and certain disorders involving the immune mechanism: Secondary | ICD-10-CM

## 2018-02-17 DIAGNOSIS — D649 Anemia, unspecified: Secondary | ICD-10-CM

## 2018-02-17 DIAGNOSIS — D472 Monoclonal gammopathy: Secondary | ICD-10-CM | POA: Diagnosis not present

## 2018-02-17 LAB — CMP (CANCER CENTER ONLY)
ALT: 12 U/L (ref 0–44)
AST: 17 U/L (ref 15–41)
Albumin: 4 g/dL (ref 3.5–5.0)
Alkaline Phosphatase: 115 U/L (ref 38–126)
Anion gap: 10 (ref 5–15)
BILIRUBIN TOTAL: 0.3 mg/dL (ref 0.3–1.2)
BUN: 18 mg/dL (ref 6–20)
CO2: 29 mmol/L (ref 22–32)
Calcium: 9.8 mg/dL (ref 8.9–10.3)
Chloride: 101 mmol/L (ref 98–111)
Creatinine: 1.15 mg/dL — ABNORMAL HIGH (ref 0.44–1.00)
GFR, EST NON AFRICAN AMERICAN: 51 mL/min — AB (ref >60)
GFR, Est AFR Am: 60 mL/min — ABNORMAL LOW (ref >60)
Glucose, Bld: 152 mg/dL — ABNORMAL HIGH (ref 70–99)
Potassium: 4.5 mmol/L (ref 3.5–5.1)
Sodium: 140 mmol/L (ref 135–145)
TOTAL PROTEIN: 7.9 g/dL (ref 6.5–8.1)

## 2018-02-17 LAB — CBC WITH DIFFERENTIAL (CANCER CENTER ONLY)
Abs Immature Granulocytes: 0.01 10*3/uL (ref 0.00–0.07)
Basophils Absolute: 0.1 10*3/uL (ref 0.0–0.1)
Basophils Relative: 1 %
EOS ABS: 0.1 10*3/uL (ref 0.0–0.5)
EOS PCT: 2 %
HEMATOCRIT: 39.5 % (ref 36.0–46.0)
Hemoglobin: 12.2 g/dL (ref 12.0–15.0)
Immature Granulocytes: 0 %
Lymphocytes Relative: 43 %
Lymphs Abs: 2.6 10*3/uL (ref 0.7–4.0)
MCH: 27.7 pg (ref 26.0–34.0)
MCHC: 30.9 g/dL (ref 30.0–36.0)
MCV: 89.8 fL (ref 80.0–100.0)
MONO ABS: 0.3 10*3/uL (ref 0.1–1.0)
MONOS PCT: 6 %
Neutro Abs: 2.9 10*3/uL (ref 1.7–7.7)
Neutrophils Relative %: 48 %
PLATELETS: 306 10*3/uL (ref 150–400)
RBC: 4.4 MIL/uL (ref 3.87–5.11)
RDW: 12.9 % (ref 11.5–15.5)
WBC Count: 6 10*3/uL (ref 4.0–10.5)
nRBC: 0 % (ref 0.0–0.2)

## 2018-02-17 NOTE — Patient Outreach (Signed)
[facility]) Care Management  02/17/2018  Kemper Shaw-Falconer 10-29-1960 [id number]   CSW was able to meet with patient and patient's husband, Archie Endo today at [facility] to perform the routine visit.  Patient had requested that [place] attend this visit with her because patient was planning to get her test results back today and felt like she needed the support.  Fortunately, patient received favorable news and does not need to report back to the office of Dr. Tish Men for another three months.  Patient was extremely appreciative of CSW attending the appointment with her today.   Patient will be leaving for [place] next week to see her family and will be gone for the entire month of December.  CSW and patient agreed to meet for the next routine home visit on Tuesday, April 07, 2017 at 9:00 AM.  CSW will discuss case closure with patient, as patient has met all of her goals and is adhering to her plan of care.  Patient reported that she will continue to utilize her deep breathing exercises and relaxation techniques for when she experiences symptoms of anxiety and/or depression.  [facility] CM Care Plan Problem One     Most Recent Value  Care Plan Problem One  Patient is experiencing symptoms of depression.  Role Documenting the Problem One  Clinical Social Worker  Care Plan for Problem One  Active  [facility] Long Term Goal   Patient will have a decrease in symptoms of depression, after receiving counseling and supportive services through CSW, within the next 45 days.  [facility] Long Term Goal Start Date  01/14/18  [facility] CM Short Term Goal #1   Patient will meet with CSW for an initial home visit to discuss symptoms of depression so that CSW can provide counseling and supportive services, within the next 10 days.  [facility] CM Short Term Goal #1 Start Date  01/14/18  [facility] CM Short Term Goal #1 Met Date  01/22/18  [facility] CM Short Term Goal #2   Patient will review EMMI information  provided to her by CSW, pertaining specifically to "Signs & Symptoms of Depression", within the next two weeks.  [facility] CM Short Term Goal #2 Start Date  01/14/18  [facility] CM Short Term Goal #2 Met Date  02/05/18  [facility] CM Short Term Goal #3  Patient will practice deep breathing exercises and relaxation techniques to help better cope with symptoms of depression, within the next 30 days.  [facility] CM Short Term Goal #3 Start Date  01/14/18  [facility] CM Short Term Goal #3 Met Date  02/10/18     [name], BSW, MSW, [place]  Licensed Clinical Social Worker  [place]  Mailing [place]. [address] [place], [place] [address] Physical Address-[address], [place], [place] [address] Toll Free Main # [phone] Fax # [phone] Cell # [phone]  Office # [phone] [name]

## 2018-02-18 LAB — KAPPA/LAMBDA LIGHT CHAINS
Kappa free light chain: 20.9 mg/L — ABNORMAL HIGH (ref 3.3–19.4)
Kappa, lambda light chain ratio: 1.39 (ref 0.26–1.65)
Lambda free light chains: 15 mg/L (ref 5.7–26.3)

## 2018-04-07 ENCOUNTER — Other Ambulatory Visit: Payer: Self-pay | Admitting: *Deleted

## 2018-04-07 NOTE — Patient Outreach (Signed)
[facility]) Care Management  04/07/2018  Nichole Garza 1960/10/06 [id number]   CSW made an attempt to try and contact patient today when she was not available at the time of our scheduled visit, but received an automated recording, stating, "The person you are trying to reach is not accepting calls at this time".  CSW was unable to leave a HIPAA compliant message on patient's cell phone number.  CSW then tried calling patient at her home number but was told by the lady answering the phone that CSW had the wrong number.  Again, CSW did not leave a HIPAA compliant message.  CSW will make a second outreach attempt within the next 3-4 business days, if a return call is not received from patient in the meantime.   [name], BSW, MSW, LCSW  Licensed Education officer, environmental Health System  Mailing [place]. [address], [place], [place] [address] Physical Address-[address], [place], [place] [address] Toll Free Main # [phone] Fax # [phone] Cell # [phone]  Office # [phone] [name]

## 2018-04-10 ENCOUNTER — Other Ambulatory Visit: Payer: Self-pay | Admitting: *Deleted

## 2018-04-10 ENCOUNTER — Encounter: Payer: Self-pay | Admitting: *Deleted

## 2018-04-10 NOTE — Patient Outreach (Signed)
[facility]) Care Management  04/10/2018  Nichole Garza 02-26-1961 [id number]   CSW made a second attempt to try and contact patient today to follow-up regarding social work services and resources, as well as to try and reschedule the routine home visit; however, patient was not available at the time of CSW's call.  When calling patient's cell phone number, CSW received an automated recording, stating, "The person you are trying to reach is not accepting calls at this time".  CSW was unable to leave a HIPAA compliant message on patient's cell phone number.  CSW then tried calling patient at her home number but was told by the lady answering the phone that CSW had the wrong number.  CSW will make a third and final outreach attempt within the next 3-4 business days, if a return call is not received from patient in the meantime.  CSW will also mail an Outreach Letter to patient's home, encouraging patient to contact CSW at her earliest convenience if she is interested in continuing to receive social work services through [place] with Scientist, clinical (histocompatibility and immunogenetics).  [name], BSW, MSW, LCSW  Licensed Education officer, environmental Health System  Mailing [place] [address]. [address] [place], [place] [address] Physical Address-[address], [place], [place] [address] Toll Free Main # [phone] Fax # [phone] Cell # [phone]  Office # [phone] [name]

## 2018-04-15 ENCOUNTER — Encounter: Payer: Self-pay | Admitting: Internal Medicine

## 2018-04-16 ENCOUNTER — Other Ambulatory Visit: Payer: Self-pay | Admitting: *Deleted

## 2018-04-16 NOTE — Patient Outreach (Signed)
[facility]) Care Management  04/16/2018  Nichole Garza 1961-03-21 [id number]   CSW made a third and final attempt to try and contact patient today to follow-up regarding social work services and resources, as well as to try and reschedule the routine home visit, without success.  When calling patient's cell phone number, CSW received an automated recording, stating, "The person you are trying to reach is not accepting calls at this time".CSW was unable to leave a HIPAA compliant message on patient's cell phonenumber. CSW then tried calling patient at her home number but was told by the lady answering the phone that CSW had the wrong number. CSW will perform a case closure within the next 2-3 business days, if a return call is not received from patient in the meantime, as required number of phone attempts will have been made and outreach letter mailed to patient's home, allowing 10 business days for a response if patient is interested in continuing to receive social work services through [place] with Scientist, clinical (histocompatibility and immunogenetics). [name], BSW, MSW, LCSW  Licensed Education officer, environmental Health System  Mailing [place] [address]. [address], [place], [place] [address] Physical Address-[address], [place],  [address] Toll Free Main # [phone] Fax # [phone] Cell # [phone]  Office # [phone] [name]

## 2018-04-21 ENCOUNTER — Other Ambulatory Visit: Payer: Self-pay | Admitting: *Deleted

## 2018-04-21 ENCOUNTER — Encounter: Payer: Self-pay | Admitting: *Deleted

## 2018-04-21 NOTE — Patient Outreach (Signed)
[facility]) Care Management  04/21/2018  Nichole Garza Oct 22, 1960 [id number]   CSW will perform a case closure on patient, due to inability to maintain phone contact with patient, despite required number of phone attempts made and outreach letter mailed to patient's home allowing 10 business days for a response if patient is interested in continuing to receive social work services through [place] with [facility].  CSW will notify patient's Telephonic RNCM with [facility] Management, Nichole Garza of CSW's plans to close patient's case.  CSW will fax an update to patient's Primary Care Physician, Nichole Garza to ensure that they are aware of CSW's involvement with patient's plan of care.   [name], BSW, MSW, LCSW  Licensed Education officer, environmental Health System  Mailing [place] [address] [place], [place] [address] Physical Address-[address], [place], [place] [address] Toll Free Main # [phone] Fax # [phone] Cell # [phone]  Office # [phone] [name]

## 2018-04-23 ENCOUNTER — Other Ambulatory Visit: Payer: Self-pay | Admitting: *Deleted

## 2018-04-23 NOTE — Patient Outreach (Signed)
[facility]) Care Management  04/23/2018  Nichole Garza [birth year] [id number]   RN Health Coach Monthly Outreach  Referral Date:11/17/2017 Referral Source:Health Risk Assessment Screening Reason for Referral:Disease Management Education Insurance:[employer]   Outreach Attempt:  Outreach attempt #1 to patient for quarterly follow up.  Patient answered and stated she was at the store, requesting a telephone call back.   Plan: RN Health Coach will make another outreach attempt within the month of February if no return call back from patient.    Keddie [phone] [email or web address]

## 2018-04-26 DIAGNOSIS — G629 Polyneuropathy, unspecified: Secondary | ICD-10-CM | POA: Diagnosis not present

## 2018-04-26 DIAGNOSIS — I10 Essential (primary) hypertension: Secondary | ICD-10-CM | POA: Diagnosis not present

## 2018-04-26 DIAGNOSIS — E861 Hypovolemia: Secondary | ICD-10-CM | POA: Diagnosis not present

## 2018-04-26 DIAGNOSIS — E1165 Type 2 diabetes mellitus with hyperglycemia: Secondary | ICD-10-CM | POA: Insufficient documentation

## 2018-04-26 DIAGNOSIS — M797 Fibromyalgia: Secondary | ICD-10-CM | POA: Diagnosis not present

## 2018-04-26 DIAGNOSIS — R55 Syncope and collapse: Secondary | ICD-10-CM | POA: Diagnosis not present

## 2018-04-26 DIAGNOSIS — E785 Hyperlipidemia, unspecified: Secondary | ICD-10-CM | POA: Diagnosis not present

## 2018-04-26 DIAGNOSIS — Z7984 Long term (current) use of oral hypoglycemic drugs: Secondary | ICD-10-CM | POA: Diagnosis not present

## 2018-04-26 DIAGNOSIS — E871 Hypo-osmolality and hyponatremia: Secondary | ICD-10-CM | POA: Diagnosis not present

## 2018-04-26 DIAGNOSIS — Z7982 Long term (current) use of aspirin: Secondary | ICD-10-CM | POA: Diagnosis not present

## 2018-04-26 DIAGNOSIS — I6523 Occlusion and stenosis of bilateral carotid arteries: Secondary | ICD-10-CM | POA: Diagnosis not present

## 2018-04-26 DIAGNOSIS — Z79899 Other long term (current) drug therapy: Secondary | ICD-10-CM | POA: Diagnosis not present

## 2018-04-26 DIAGNOSIS — N3090 Cystitis, unspecified without hematuria: Secondary | ICD-10-CM | POA: Diagnosis not present

## 2018-04-26 DIAGNOSIS — I959 Hypotension, unspecified: Secondary | ICD-10-CM | POA: Diagnosis not present

## 2018-04-26 DIAGNOSIS — J329 Chronic sinusitis, unspecified: Secondary | ICD-10-CM | POA: Diagnosis not present

## 2018-04-26 DIAGNOSIS — IMO0002 Reserved for concepts with insufficient information to code with codable children: Secondary | ICD-10-CM | POA: Insufficient documentation

## 2018-04-26 DIAGNOSIS — R9431 Abnormal electrocardiogram [ECG] [EKG]: Secondary | ICD-10-CM | POA: Diagnosis not present

## 2018-04-26 DIAGNOSIS — R531 Weakness: Secondary | ICD-10-CM | POA: Diagnosis not present

## 2018-04-26 DIAGNOSIS — E1140 Type 2 diabetes mellitus with diabetic neuropathy, unspecified: Secondary | ICD-10-CM | POA: Diagnosis not present

## 2018-04-27 DIAGNOSIS — E119 Type 2 diabetes mellitus without complications: Secondary | ICD-10-CM | POA: Diagnosis not present

## 2018-04-27 DIAGNOSIS — M797 Fibromyalgia: Secondary | ICD-10-CM | POA: Diagnosis not present

## 2018-04-27 DIAGNOSIS — R55 Syncope and collapse: Secondary | ICD-10-CM | POA: Diagnosis not present

## 2018-05-08 ENCOUNTER — Other Ambulatory Visit: Payer: Self-pay | Admitting: *Deleted

## 2018-05-08 NOTE — Patient Outreach (Signed)
[facility]) Care Management  05/08/2018  [place] Shaw-Falconer 02/01/1961 [id number]   [place] Quarterly Outreach  Referral Date:11/17/2017 Referral Source:Health Risk Assessment Screening Reason for Referral:Disease Management Education Insurance:[employer]   Outreach Attempt:  Outreach attempt #2 to patient for quarterly follow up.  Patient answered and verified HIPAA.  Reports she has changed insurance from [place] to [employer].  RN Health Scientist, clinical (histocompatibility and immunogenetics) to verify if patient on All Payor List.  Patient continues to show [employer] on the All Payor List.  Updated patient and patient requesting return call next month after verification of new insurance and continued services  Plan:  RN Health Coach will make another outreach attempt within the month of March per patient's request.  [name] RN [place] [phone] [email or web address]

## 2018-05-12 DIAGNOSIS — N951 Menopausal and female climacteric states: Secondary | ICD-10-CM | POA: Diagnosis not present

## 2018-05-19 ENCOUNTER — Other Ambulatory Visit: Payer: PPO

## 2018-05-19 ENCOUNTER — Ambulatory Visit: Payer: PPO | Admitting: Hematology

## 2018-05-19 DIAGNOSIS — I10 Essential (primary) hypertension: Secondary | ICD-10-CM | POA: Diagnosis not present

## 2018-05-19 DIAGNOSIS — E1140 Type 2 diabetes mellitus with diabetic neuropathy, unspecified: Secondary | ICD-10-CM | POA: Diagnosis not present

## 2018-05-19 DIAGNOSIS — Z124 Encounter for screening for malignant neoplasm of cervix: Secondary | ICD-10-CM | POA: Diagnosis not present

## 2018-05-19 DIAGNOSIS — E119 Type 2 diabetes mellitus without complications: Secondary | ICD-10-CM | POA: Diagnosis not present

## 2018-05-19 DIAGNOSIS — G43909 Migraine, unspecified, not intractable, without status migrainosus: Secondary | ICD-10-CM | POA: Diagnosis not present

## 2018-05-25 NOTE — Progress Notes (Signed)
[facility] OFFICE PROGRESS NOTE  Patient Care Team: Benito Mccreedy, MD as PCP - General (Internal Medicine) Leona Singleton, RN as [place] Management  HEME/ONC OVERVIEW: 1. IgA kappa MGUS -M-spike 0.4 g/dL, IgA 566 during routine nephrology evaluation (Cr 1.0-1.2; Ca 10.2, Hgb ~11 w/ MCV 88)  -01/2018: baseline labs   -M-spike 0.5 g/dL, monoclonal IgA kappa, IgA 585, free kappa 21; LDH 183, B2M 1.3   -UPEP, UFE negative, urine free light chains normal  -PET negative for bony lesions or plasmacytoma  -Hgb, calcium, creatinine normal   PERTINENT NON-HEM/ONC PROBLEMS: 1. Poorly controlled Type II DM with peripheral neuropathy (A1c ~mid-9's)  ASSESSMENT & PLAN:   IgA kappa MGUS  -No CRAB criteria -Bone marrow biopsy deferred as it would unlikely change management at this time  -Hgb, Ca and Cr normal today -Myeloma labs pending  -Given the low M-spike and the patient's financial difficulty, we we will plan to monitor myeloma labs q17month for any interval change and reduce the financial burden on her due to more frequent clinic visits  Leukocytosis -WBC 10.8k today, new -Patient just completed 5 days of steroid for persistent right shoulder radiculopathy pain, and is most likely the cause of mild leukocytosis  -She denies any symptoms of infection -We will monitor for now  Right shoulder radiculopathy -Patient reports several month of pain radiating from the right shoulder/neck to her right hand; she has been instructed to contact her neurologist but has not made an appointment due to the cost of co-pay for her clinic visits and limited finances -She recently completed a course of empiric steroid without any symptomatic improvement -I have referred the patient to social work for consideration of any patient assistant program, and encouraged her to follow with her neurologist as soon as financially able   Orders Placed This Encounter   Procedures  . CBC with Differential (Cancer Center Only)    Standing Status:   Future    Standing Expiration Date:   06/30/2019  . C[facility] ([facility])    Standing Status:   Future    Standing Expiration Date:   06/30/2019  . Multiple Myeloma Panel (SPEP&IFE w/QIG)    Standing Status:   Future    Standing Expiration Date:   06/30/2019  . Kappa/lambda light chains    Standing Status:   Future    Standing Expiration Date:   11/24/2019  . Ambulatory referral to Social Work    Referral Priority:   Urgent    Referral Type:   Consultation    Referral Reason:   Specialty Services Required    Number of Visits Requested:   1   All questions were answered. The patient knows to call the clinic with any problems, questions or concerns. No barriers to learning was detected.  A total of more than 25 minutes were spent face-to-face with the patient during this encounter and over half of that time was spent on counseling and coordination of care as outlined above.   Return in 6 months for labs and clinic follow-up.  YTish Men MD 05/26/2018 4:13 PM  CHIEF COMPLAINT: "My shoulder still bothers me"  INTERVAL HISTORY: Ms. SBillmanreturns to clinic for follow-up of MGUS.  Patient reports that she has had chronic pain radiating from her right shoulder to her hand since 01/2018.  She was recently prescribed 5 days of steroid, which he completed yesterday, but it did not help with the radiculopathy pain.  She has  been instructed to contact her neurologist for further evaluation, but she has not scheduled any appointment yet due to the cost of co-pays for the clinic visits and limited finances.  She was recently seen at [facility] ER for syncope, which was attributed to dehydration secondary to hyperglycemia due to poorly controlled diabetes.  She also has seen her gynecologist for hot flashes and was prescribed estradiol, but it has not helped with her symptoms.  She reports that she has been  struggling with her finances, including paying for her medications and utility bills, and is concerned about the cost of her medical care.  She denies any other complaint today.  REVIEW OF SYSTEMS:   Constitutional: ( - ) fevers, ( - )  chills , ( - ) night sweats Eyes: ( - ) blurriness of vision, ( - ) double vision, ( - ) watery eyes Ears, nose, mouth, throat, and face: ( - ) mucositis, ( - ) sore throat Respiratory: ( - ) cough, ( - ) dyspnea, ( - ) wheezes Cardiovascular: ( - ) palpitation, ( - ) chest discomfort, ( - ) lower extremity swelling Gastrointestinal:  ( - ) nausea, ( - ) heartburn, ( - ) change in bowel habits Skin: ( - ) abnormal skin rashes Lymphatics: ( - ) new lymphadenopathy, ( - ) easy bruising Neurological: ( + ) numbness, ( + ) tingling, ( - ) new weaknesses Behavioral/Psych: ( - ) mood change, ( - ) new changes  All other systems were reviewed with the patient and are negative.  I have reviewed the past medical history, past surgical history, social history and family history with the patient and they are unchanged from previous note.  ALLERGIES:  is allergic to enalapril; hydrocodone; latex; and relpax [eletriptan].  MEDICATIONS:  Current Outpatient Medications  Medication Sig Dispense Refill  . amLODipine (NORVASC) 5 MG tablet Take 5 mg by mouth daily.    Marland Kitchen aspirin EC 81 MG tablet Take 81 mg by mouth every morning.    . Black Cohosh 20 MG TABS Take by mouth.    . DULoxetine (CYMBALTA) 30 MG capsule Take 30 mg by mouth daily.    . fish oil-omega-3 fatty acids 1000 MG capsule Take 1 g by mouth 3 (three) times daily.     Marland Kitchen gabapentin (NEURONTIN) 800 MG tablet Take 800 mg by mouth 3 (three) times daily.    Marland Kitchen GINKGO BILOBA COMPLEX PO Take 1 capsule by mouth daily.     Marland Kitchen glimepiride (AMARYL) 4 MG tablet Take 1 tablet by mouth 2 (two) times daily.  1  . metFORMIN (GLUCOPHAGE) 500 MG tablet Take 500 mg by mouth 2 (two) times daily with a meal.    . Misc Natural  Products (COLON CLEANSE) CAPS Take 1 capsule by mouth 2 (two) times daily.    . Multiple Vitamin (MULTIVITAMIN WITH MINERALS) TABS Take 1 tablet by mouth daily.    . pravastatin (PRAVACHOL) 10 MG tablet Take 10 mg by mouth at bedtime.    . propranolol (INDERAL) 60 MG tablet Take 60 mg by mouth 2 (two) times daily.    Marland Kitchen tiZANidine (ZANAFLEX) 4 MG capsule Take 4 mg by mouth daily. Take 2 tablets once daily    . vitamin C (ASCORBIC ACID) 500 MG tablet Take 500 mg by mouth daily.     No current facility-administered medications for this visit.     PHYSICAL EXAMINATION: ECOG PERFORMANCE STATUS: 1 - Symptomatic but completely ambulatory  Today's Vitals   05/26/18 1428  BP: (!) 152/89  Pulse: 87  Resp: 18  Temp: 98.5 F (36.9 C)  TempSrc: Oral  SpO2: 100%  Weight: 117 lb 6.4 oz (53.3 kg)  PainSc: 0-No pain   Body mass index is 22.93 kg/m.  Filed Weights   05/26/18 1428  Weight: 117 lb 6.4 oz (53.3 kg)    GENERAL: alert, no distress SKIN: skin color, texture, turgor are normal, no rashes or significant lesions EYES: conjunctiva are pink and non-injected, sclera clear OROPHARYNX: no exudate, no erythema; lips, buccal mucosa, and tongue normal  NECK: supple, non-tender LYMPH:  no palpable lymphadenopathy in the cervical LUNGS: clear to auscultation with normal breathing effort HEART: regular rate & rhythm and no murmurs and no lower extremity edema ABDOMEN: soft, non-tender, non-distended, normal bowel sounds Musculoskeletal: no cyanosis of digits and no clubbing  PSYCH: alert & oriented x 3, fluent speech NEURO: 4+/5 strength in bilateral upper extremities, 5/5 in bilateral lower extremities, mildly decreased sensation in the right hand, normal sensation in other extremities   LABORATORY DATA:  I have reviewed the data as listed    Component Value Date/Time   NA 137 05/26/2018 1404   K 3.4 (L) 05/26/2018 1404   CL 97 (L) 05/26/2018 1404   CO2 30 05/26/2018 1404   GLUCOSE  95 05/26/2018 1404   BUN 18 05/26/2018 1404   CREATININE 1.02 (H) 05/26/2018 1404   CALCIUM 10.1 05/26/2018 1404   PROT 7.5 05/26/2018 1404   ALBUMIN 4.9 05/26/2018 1404   AST 12 (L) 05/26/2018 1404   ALT 11 05/26/2018 1404   ALKPHOS 98 05/26/2018 1404   BILITOT 0.3 05/26/2018 1404   GFRNONAA >60 05/26/2018 1404   GFRAA >60 05/26/2018 1404    No results found for: SPEP, UPEP  Lab Results  Component Value Date   WBC 10.8 (H) 05/26/2018   NEUTROABS 5.8 05/26/2018   HGB 12.6 05/26/2018   HCT 40.1 05/26/2018   MCV 87.4 05/26/2018   PLT 304 05/26/2018      Chemistry      Component Value Date/Time   NA 137 05/26/2018 1404   K 3.4 (L) 05/26/2018 1404   CL 97 (L) 05/26/2018 1404   CO2 30 05/26/2018 1404   BUN 18 05/26/2018 1404   CREATININE 1.02 (H) 05/26/2018 1404      Component Value Date/Time   CALCIUM 10.1 05/26/2018 1404   ALKPHOS 98 05/26/2018 1404   AST 12 (L) 05/26/2018 1404   ALT 11 05/26/2018 1404   BILITOT 0.3 05/26/2018 1404

## 2018-05-26 ENCOUNTER — Inpatient Hospital Stay: Payer: Medicare Other | Attending: Hematology

## 2018-05-26 ENCOUNTER — Other Ambulatory Visit: Payer: Self-pay

## 2018-05-26 ENCOUNTER — Encounter: Payer: Self-pay | Admitting: Hematology

## 2018-05-26 ENCOUNTER — Inpatient Hospital Stay (HOSPITAL_BASED_OUTPATIENT_CLINIC_OR_DEPARTMENT_OTHER): Payer: Medicare Other | Admitting: Hematology

## 2018-05-26 VITALS — BP 152/89 | HR 87 | Temp 98.5°F | Resp 18 | Wt 117.4 lb

## 2018-05-26 DIAGNOSIS — M5410 Radiculopathy, site unspecified: Secondary | ICD-10-CM | POA: Diagnosis not present

## 2018-05-26 DIAGNOSIS — E1165 Type 2 diabetes mellitus with hyperglycemia: Secondary | ICD-10-CM | POA: Diagnosis not present

## 2018-05-26 DIAGNOSIS — Z7984 Long term (current) use of oral hypoglycemic drugs: Secondary | ICD-10-CM | POA: Insufficient documentation

## 2018-05-26 DIAGNOSIS — D472 Monoclonal gammopathy: Secondary | ICD-10-CM | POA: Diagnosis not present

## 2018-05-26 DIAGNOSIS — D72829 Elevated white blood cell count, unspecified: Secondary | ICD-10-CM | POA: Insufficient documentation

## 2018-05-26 DIAGNOSIS — G8929 Other chronic pain: Secondary | ICD-10-CM

## 2018-05-26 DIAGNOSIS — Z741 Need for assistance with personal care: Secondary | ICD-10-CM

## 2018-05-26 LAB — CMP (CANCER CENTER ONLY)
ALT: 11 U/L (ref 0–44)
ANION GAP: 10 (ref 5–15)
AST: 12 U/L — ABNORMAL LOW (ref 15–41)
Albumin: 4.9 g/dL (ref 3.5–5.0)
Alkaline Phosphatase: 98 U/L (ref 38–126)
BILIRUBIN TOTAL: 0.3 mg/dL (ref 0.3–1.2)
BUN: 18 mg/dL (ref 6–20)
CALCIUM: 10.1 mg/dL (ref 8.9–10.3)
CO2: 30 mmol/L (ref 22–32)
Chloride: 97 mmol/L — ABNORMAL LOW (ref 98–111)
Creatinine: 1.02 mg/dL — ABNORMAL HIGH (ref 0.44–1.00)
GFR, Est AFR Am: >60 mL/min (ref >60)
GFR, Estimated: >60 mL/min (ref >60)
Glucose, Bld: 95 mg/dL (ref 70–99)
Potassium: 3.4 mmol/L — ABNORMAL LOW (ref 3.5–5.1)
Sodium: 137 mmol/L (ref 135–145)
TOTAL PROTEIN: 7.5 g/dL (ref 6.5–8.1)

## 2018-05-26 LAB — CBC WITH DIFFERENTIAL (CANCER CENTER ONLY)
ABS IMMATURE GRANULOCYTES: 0.07 10*3/uL (ref 0.00–0.07)
BASOS ABS: 0.1 10*3/uL (ref 0.0–0.1)
Basophils Relative: 1 %
Eosinophils Absolute: 0 10*3/uL (ref 0.0–0.5)
Eosinophils Relative: 0 %
HCT: 40.1 % (ref 36.0–46.0)
HEMOGLOBIN: 12.6 g/dL (ref 12.0–15.0)
Immature Granulocytes: 1 %
LYMPHS PCT: 40 %
Lymphs Abs: 4.3 10*3/uL — ABNORMAL HIGH (ref 0.7–4.0)
MCH: 27.5 pg (ref 26.0–34.0)
MCHC: 31.4 g/dL (ref 30.0–36.0)
MCV: 87.4 fL (ref 80.0–100.0)
MONO ABS: 0.5 10*3/uL (ref 0.1–1.0)
Monocytes Relative: 5 %
NEUTROS ABS: 5.8 10*3/uL (ref 1.7–7.7)
Neutrophils Relative %: 53 %
Platelet Count: 304 10*3/uL (ref 150–400)
RBC: 4.59 MIL/uL (ref 3.87–5.11)
RDW: 13.6 % (ref 11.5–15.5)
WBC Count: 10.8 10*3/uL — ABNORMAL HIGH (ref 4.0–10.5)
nRBC: 0 % (ref 0.0–0.2)

## 2018-05-27 ENCOUNTER — Telehealth: Payer: Self-pay | Admitting: General Practice

## 2018-05-27 ENCOUNTER — Encounter: Payer: Self-pay | Admitting: General Practice

## 2018-05-27 LAB — IGG, IGA, IGM
IGA: 662 mg/dL — AB (ref 87–352)
IgG (Immunoglobin G), Serum: 1035 mg/dL (ref 700–1600)
IgM (Immunoglobulin M), Srm: 53 mg/dL (ref 26–217)

## 2018-05-27 LAB — KAPPA/LAMBDA LIGHT CHAINS
Kappa free light chain: 18.5 mg/L (ref 3.3–19.4)
Kappa, lambda light chain ratio: 1.37 (ref 0.26–1.65)
LAMDA FREE LIGHT CHAINS: 13.5 mg/L (ref 5.7–26.3)

## 2018-05-27 NOTE — Progress Notes (Signed)
[place] CSW Progress Note  As patient is struggling w multiple areas of financial stress, including medical debt, referral made to [employer] for emergency assistance and financial counseling via [place] [facility].  Patient also sent information on consumer credit counseling available through [place] and [employer].  Patient aware of these referrals.  Edwyna Shell, LCSW Clinical Social Worker Phone:  [phone]

## 2018-05-27 NOTE — Telephone Encounter (Signed)
[place] CSW Progress Notes  Referral received from [facility] MD to reach out to patient re any available patient assistance programs.  Called patient who is distressed by past due bills from [facility] for previous services.  Is paying small amount/month but is concerned about being sent to collections as she is unable to pay these bills.  Also fearful about scheduling new appointments (MRI has been urgently recommended) because "I dont have the money to pay for anything."  Is used to paying her bills, being able to afford costs of care.  Current episode has significantly distressed her as she is concerned about her health and wants to follow MD recommendations but is unable to afford copays and deductibles as required by Medicare policy.  She has applied for Medicaid at [place] which was denied because she is over income.  Was linked w [facility] CSW, J Saporito, but was out of town for extended period of time making [facility] CSW unable to contact and case was closed.  Undersigned CSW will research if there are any available patient assistance funds for copays and deductibles for patient diagnosed w MGUS - CSW is unaware of any.  Also messages CSW Saporito and asked if she could reconnect w patient to provide support as patient found this valuable in the past.  Encouraged patient to follow MD recommendations for care and treatments.  Edwyna Shell, LCSW Clinical Social Worker Phone:  [phone]

## 2018-05-28 LAB — IMMUNOFIXATION ELECTROPHORESIS
IgA: 624 mg/dL — ABNORMAL HIGH (ref 87–352)
IgG (Immunoglobin G), Serum: 1046 mg/dL (ref 700–1600)
IgM (Immunoglobulin M), Srm: 59 mg/dL (ref 26–217)
Total Protein ELP: 7.4 g/dL (ref 6.0–8.5)

## 2018-05-28 LAB — PROTEIN ELECTROPHORESIS, SERUM
A/G Ratio: 1.2 (ref 0.7–1.7)
ALPHA-2-GLOBULIN: 0.9 g/dL (ref 0.4–1.0)
Albumin ELP: 4 g/dL (ref 2.9–4.4)
Alpha-1-Globulin: 0.2 g/dL (ref 0.0–0.4)
BETA GLOBULIN: 1.4 g/dL — AB (ref 0.7–1.3)
Gamma Globulin: 1 g/dL (ref 0.4–1.8)
Globulin, Total: 3.4 g/dL (ref 2.2–3.9)
M-SPIKE, %: 0.5 g/dL — AB
Total Protein ELP: 7.4 g/dL (ref 6.0–8.5)

## 2018-06-08 ENCOUNTER — Encounter: Payer: Self-pay | Admitting: *Deleted

## 2018-06-08 ENCOUNTER — Other Ambulatory Visit: Payer: Self-pay | Admitting: *Deleted

## 2018-06-08 DIAGNOSIS — E119 Type 2 diabetes mellitus without complications: Secondary | ICD-10-CM | POA: Diagnosis not present

## 2018-06-08 DIAGNOSIS — G43909 Migraine, unspecified, not intractable, without status migrainosus: Secondary | ICD-10-CM | POA: Diagnosis not present

## 2018-06-08 DIAGNOSIS — M797 Fibromyalgia: Secondary | ICD-10-CM | POA: Diagnosis not present

## 2018-06-08 DIAGNOSIS — I10 Essential (primary) hypertension: Secondary | ICD-10-CM | POA: Diagnosis not present

## 2018-06-08 DIAGNOSIS — E1140 Type 2 diabetes mellitus with diabetic neuropathy, unspecified: Secondary | ICD-10-CM | POA: Diagnosis not present

## 2018-06-08 NOTE — Patient Outreach (Signed)
[facility]) Care Management  06/08/2018  Nichole Garza 23-Jul-1960 [id number]   CSW made an initial attempt to try and contact patient today to perform the initial phone assessment, as well as assess and assist with social work needs and services, without success.  A HIPAA compliant message was left for patient on voicemail.  CSW is currently awaiting a return call.  CSW will make a second outreach attempt within the next 3-4 business days, if a return call is not received from patient in the meantime.  CSW will also mail an Outreach Letter to patient's home requesting that patient contact CSW if patient is interested in receiving social work services through [place] with Scientist, clinical (histocompatibility and immunogenetics).  [name], BSW, MSW, LCSW  Licensed Education officer, environmental Health System  Mailing [name] [address], [place], [place] [address] Physical Address-[address], [place],  [address] Toll Free Main # [phone] Fax # [phone] Cell # [phone]  Office # [phone] [name]

## 2018-06-11 ENCOUNTER — Other Ambulatory Visit: Payer: Self-pay

## 2018-06-11 ENCOUNTER — Other Ambulatory Visit: Payer: Self-pay | Admitting: *Deleted

## 2018-06-11 ENCOUNTER — Encounter: Payer: Self-pay | Admitting: *Deleted

## 2018-06-11 NOTE — Patient Outreach (Signed)
[facility]) Care Management  [place]  06/12/2018   Nichole Garza 01-11-1961 [id number]   [place] Quarterly Outreach   Referral Date:  11/17/2017 Referral Source:  Health Risk Assessment Screening Reason for Referral:  Disease Management Education Insurance:  [employer]   Outreach Attempt:  Successful telephone outreach to patient for follow up.  HIPAA verified with patient.  Patient reporting she continues to have shoulder pain.  Currently finishing up prednisone taper.  Reports blood sugars have been elevated due to steroid use but are trending down this week.  Fasting blood sugar this morning was 127, with this week fasting ranges of 90-120's.  Last few week fasting ranges of 200's.  Reports recent visit to primary care provider but does not know her latest Hgb A1C, states she was just told it was elevated and diabetes medications adjusted.  Encouraged patient to inquire about her latest Hgb A1C.  Notified patient [facility] Social Worker is trying to reach her and requested she return call to [place].  Patient stated she would.  Reports 2 episodes of presyncope related to hypotension, one requiring overnight stay in hospital.  States her blood pressure medications has been adjusted and she has not had anymore episodes.  Encounter Medications:  Outpatient Encounter Medications as of 06/11/2018  Medication Sig Note  . amLODipine (NORVASC) 5 MG tablet Take 5 mg by mouth daily.   Marland Kitchen aspirin EC 81 MG tablet Take 81 mg by mouth every morning.   . Black Cohosh 20 MG TABS Take by mouth.   . DULoxetine (CYMBALTA) 30 MG capsule Take 30 mg by mouth daily. 01/08/2018: Reports taking 60 mg daily  . fish oil-omega-3 fatty acids 1000 MG capsule Take 1 g by mouth 3 (three) times daily.    Marland Kitchen gabapentin (NEURONTIN) 800 MG tablet Take 800 mg by mouth 3 (three) times daily.   Marland Kitchen GINKGO BILOBA COMPLEX PO Take 1 capsule by mouth daily.  01/08/2018:  Reports taking twice a day  . glimepiride (AMARYL) 4 MG tablet Take 1 tablet by mouth 2 (two) times daily.   . metFORMIN (GLUCOPHAGE) 500 MG tablet Take 500 mg by mouth 2 (two) times daily with a meal. 06/11/2018: Reports taking 1 1/2 tablets twice a day  . Misc Natural Products (COLON CLEANSE) CAPS Take 1 capsule by mouth 2 (two) times daily.   . Multiple Vitamin (MULTIVITAMIN WITH MINERALS) TABS Take 1 tablet by mouth daily.   . pravastatin (PRAVACHOL) 10 MG tablet Take 10 mg by mouth at bedtime. 08/20/2013: .   Marland Kitchen tiZANidine (ZANAFLEX) 4 MG capsule Take 4 mg by mouth daily. Take 2 tablets once daily   . vitamin C (ASCORBIC ACID) 500 MG tablet Take 500 mg by mouth daily.   . propranolol (INDERAL) 60 MG tablet Take 60 mg by mouth 2 (two) times daily. 01/08/2018: Reports not taking   No facility-administered encounter medications on file as of 06/11/2018.     Functional Status:  In your present state of health, do you have any difficulty performing the following activities: 01/14/2018 01/08/2018  Hearing? N N  Vision? N N  Difficulty concentrating or making decisions? Y Y  Comment - become very forgetful  Walking or climbing stairs? N N  Dressing or bathing? N N  Doing errands, shopping? N N  Preparing Food and eating ? N N  Using the Toilet? N N  In the past six months, have you accidently leaked urine? N N  Do you have problems with loss of bowel control? N N  Managing your Medications? N N  Managing your Finances? N N  Housekeeping or managing your Housekeeping? N N  Some recent data might be hidden    Fall/Depression Screening: Fall Risk  06/11/2018 01/14/2018 01/08/2018  Falls in the past year? 0 No No  Follow up Falls evaluation completed;Falls prevention discussed;Education provided - -   PHQ 2/9 Scores 01/14/2018 01/08/2018 11/17/2017  PHQ - 2 Score 3 3 1   PHQ- 9 Score 7 7 -   [facility] CM Care Plan Problem One     Most Recent Value  Care Plan Problem One  Knowledge defiecit  related to self care management of diabetes.  Role Documenting the Problem One  [place] for Problem One  Active  [facility] Long Term Goal   Patient will verbalize reduction in her Hgb A1C by 0.3 points in the next 90 days.  [facility] Long Term Goal Start Date  06/11/18  Interventions for Problem One Long Term Goal  Encouraged patient to inquire about her current Hgb A1C from her provider, reviewed current and recent glucose trends, discussed ways to help reduce glucose readings, reviewed low carbohydrate, reviewed medications and encouraged medication compliance, encouraged to keep and attend medical appointments, Care plan and goals reviewed and discussed with patient  [facility] CM Short Term Goal #1   Patient will verbalize 2 foods she has limited in the next 90 days.  [facility] CM Short Term Goal #1 Start Date  01/08/18  [facility] CM Short Term Goal #1 Met Date  06/11/18     Appointments:  Reports attending primary care appointment with Dr. Vista Lawman on 06/08/2018 and next scheduled appointment is in June 2020.  Plan: RN Health Coach will send primary care provider quarterly update. RN Health Coach will make next telephone outreach to patient in the month of June.  [place] [phone] [email or web address]

## 2018-06-15 ENCOUNTER — Encounter: Payer: Self-pay | Admitting: *Deleted

## 2018-06-15 ENCOUNTER — Other Ambulatory Visit: Payer: Self-pay | Admitting: *Deleted

## 2018-06-15 NOTE — Patient Outreach (Signed)
[facility]) Care Management  06/15/2018  Helmi Shaw-Falconer 11-05-1960 [id number]   CSW was able to make initial contact with patient today to perform phone assessment, as well as assess and assist with social work needs and services.  Patient remembers having worked with [facility] in the past, requesting to receive a call from [place] to discuss continuing to receive in-home counseling and supportive services through CSW for symptoms of anxiety and depression.  Patient reported that this request was made prior to her learning of the Coronavirus, admitting that she is no longer wanting to receive any type of services at this time, for fear of contracting the virus.  CSW obtained two HIPAA compliant identifiers from patient, which included patient's name and date of birth.    Patient went on to say that she is cancelling all her physician appointments and that she does not plan to leave her home, unless absolutely necessary.  Patient stated that she continues to suffer from Fibromyalgia pain, which causes her a great deal of anxiety and depression, but plans to continue to perform her home exercise routine, take her medications exactly as prescribed and try to relax as much as possible.  Patient also indicated that she has been using her deep breathing exercises, relaxation techniques and positive coping mechanisms, taught to her by CSW, to help her combat her symptoms of anxiety and depression.  Patient reported that it helps a great deal of the time, and when it does not help, she just takes medication to help her sleep.  CSW offered to provide counseling and supportive services to patient over the phone, at least until CSW is allowed to perform home visits again, but patient denied.  CSW also offered to provide patient with a list of counselors/therapists in the [place], [place] area that accept [employer], but again, patient did not appear to be interested.  Patient  denied feeling homicidal or suicidal, admitting that she could never harm herself.  Patient agreed to contact CSW in one month if she decides that she needs to receive ongoing counseling and supportive services.  CSW was able to confirm that patient has the correct contact information for CSW, encouraging patient to contact CSW at any time if she needs additional social work needs and services.  CSW will perform a case closure on patient, as all goals of treatment have been met from social work standpoint and no additional social work needs have been identified at this time.  CSW will notify patient's Telephonic RNCM with [place] Management, [name] of CSW's plans to close patient's case.  CSW will fax an update to patient's Primary Care Physician, Dr. [name] Osei-Bonsu to ensure that they are aware of CSW's involvement with patient's plan of care.  [name], BSW, MSW, LCSW  Licensed Education officer, environmental Health System  Mailing [place] [address] [place], [place] [address] Physical Address-[address], [place], [place] [address] Toll Free Main # [phone] Fax # [phone] Cell # [phone]  Office # [phone] [name]

## 2018-06-16 DIAGNOSIS — M79601 Pain in right arm: Secondary | ICD-10-CM | POA: Diagnosis not present

## 2018-06-16 DIAGNOSIS — M797 Fibromyalgia: Secondary | ICD-10-CM | POA: Diagnosis not present

## 2018-06-16 DIAGNOSIS — G894 Chronic pain syndrome: Secondary | ICD-10-CM | POA: Diagnosis not present

## 2018-07-14 DIAGNOSIS — N2581 Secondary hyperparathyroidism of renal origin: Secondary | ICD-10-CM | POA: Diagnosis not present

## 2018-07-14 DIAGNOSIS — D649 Anemia, unspecified: Secondary | ICD-10-CM | POA: Diagnosis not present

## 2018-07-14 DIAGNOSIS — I129 Hypertensive chronic kidney disease with stage 1 through stage 4 chronic kidney disease, or unspecified chronic kidney disease: Secondary | ICD-10-CM | POA: Diagnosis not present

## 2018-07-14 DIAGNOSIS — E1122 Type 2 diabetes mellitus with diabetic chronic kidney disease: Secondary | ICD-10-CM | POA: Diagnosis not present

## 2018-07-14 DIAGNOSIS — N183 Chronic kidney disease, stage 3 (moderate): Secondary | ICD-10-CM | POA: Diagnosis not present

## 2018-07-28 DIAGNOSIS — M79601 Pain in right arm: Secondary | ICD-10-CM | POA: Diagnosis not present

## 2018-07-28 DIAGNOSIS — G5601 Carpal tunnel syndrome, right upper limb: Secondary | ICD-10-CM | POA: Insufficient documentation

## 2018-09-01 DIAGNOSIS — E1140 Type 2 diabetes mellitus with diabetic neuropathy, unspecified: Secondary | ICD-10-CM | POA: Diagnosis not present

## 2018-09-01 DIAGNOSIS — E119 Type 2 diabetes mellitus without complications: Secondary | ICD-10-CM | POA: Diagnosis not present

## 2018-09-01 DIAGNOSIS — I10 Essential (primary) hypertension: Secondary | ICD-10-CM | POA: Diagnosis not present

## 2018-09-01 DIAGNOSIS — G43909 Migraine, unspecified, not intractable, without status migrainosus: Secondary | ICD-10-CM | POA: Diagnosis not present

## 2018-09-01 DIAGNOSIS — M797 Fibromyalgia: Secondary | ICD-10-CM | POA: Diagnosis not present

## 2018-09-10 DIAGNOSIS — H524 Presbyopia: Secondary | ICD-10-CM | POA: Diagnosis not present

## 2018-09-10 DIAGNOSIS — H5203 Hypermetropia, bilateral: Secondary | ICD-10-CM | POA: Diagnosis not present

## 2018-09-10 DIAGNOSIS — H52223 Regular astigmatism, bilateral: Secondary | ICD-10-CM | POA: Diagnosis not present

## 2018-09-14 ENCOUNTER — Other Ambulatory Visit: Payer: Self-pay | Admitting: *Deleted

## 2018-09-21 NOTE — Progress Notes (Deleted)
TELEHEALTH VISIT  Referring Provider: Benito Mccreedy, MD Primary Care Physician:  Benito Mccreedy, MD   Tele-visit due to COVID-19 pandemic Patient requested visit virtually, consented to the virtual encounter via video enabled telemedicine application Contact made at:  Patient verified by name and date of birth Location of patient: Home Location provider: [facility] Names of persons participating: Me, patient, Tinnie Gens CMA Time spent on telehealth visit:  I discussed the limitations of evaluation and management by telemedicine. The patient expressed understanding and agreed to proceed.  Reason for Consultation:  ***   IMPRESSION:  ***  PLAN: ***   HPI: Nichole Garza is a 58 y.o. female referred by Dr. Vista Lawman for irritable bowel syndrome. The history is obtained through the patient and review of her referral records. She has hypertension, diabetes with associated neuropathy, migraines, fibromyalgia, hypyerlipidemis.    No known family history of colon cancer or polyps. No family history of uterine/endometrial cancer, pancreatic cancer or gastric/stomach cancer.  Past Medical History:  Diagnosis Date  . Arthritis    in her spine  . Diabetes mellitus   . Fibromyalgia   . Hypertension   . Migraine headache   . Nerve pain   . Renal disorder    kidney stone  . TIA (transient ischemic attack)     Past Surgical History:  Procedure Laterality Date  . ABDOMINAL HYSTERECTOMY  2012  . CYSTOSCOPY WITH RETROGRADE PYELOGRAM, URETEROSCOPY AND STENT PLACEMENT Right 05/29/2012   Procedure: CYSTOSCOPY WITH RETROGRADE PYELOGRAM, URETEROSCOPY AND STENT PLACEMENT;  Surgeon: Hanley Ben, MD;  Location: [facility];  Service: Urology;  Laterality: Right;  . HOLMIUM LASER APPLICATION Right 08/31/930   Procedure: HOLMIUM LASER APPLICATION;  Surgeon: Hanley Ben, MD;  Location: [facility];  Service: Urology;  Laterality: Right;    Current Outpatient  Medications  Medication Sig Dispense Refill  . amLODipine (NORVASC) 5 MG tablet Take 5 mg by mouth daily.    Marland Kitchen aspirin EC 81 MG tablet Take 81 mg by mouth every morning.    . Black Cohosh 20 MG TABS Take by mouth.    . DULoxetine (CYMBALTA) 30 MG capsule Take 30 mg by mouth daily.    . fish oil-omega-3 fatty acids 1000 MG capsule Take 1 g by mouth 3 (three) times daily.     Marland Kitchen gabapentin (NEURONTIN) 800 MG tablet Take 800 mg by mouth 3 (three) times daily.    Marland Kitchen GINKGO BILOBA COMPLEX PO Take 1 capsule by mouth daily.     Marland Kitchen glimepiride (AMARYL) 4 MG tablet Take 1 tablet by mouth 2 (two) times daily.  1  . metFORMIN (GLUCOPHAGE) 500 MG tablet Take 500 mg by mouth 2 (two) times daily with a meal.    . Misc Natural Products (COLON CLEANSE) CAPS Take 1 capsule by mouth 2 (two) times daily.    . Multiple Vitamin (MULTIVITAMIN WITH MINERALS) TABS Take 1 tablet by mouth daily.    . pravastatin (PRAVACHOL) 10 MG tablet Take 10 mg by mouth at bedtime.    . propranolol (INDERAL) 60 MG tablet Take 60 mg by mouth 2 (two) times daily.    Marland Kitchen tiZANidine (ZANAFLEX) 4 MG capsule Take 4 mg by mouth daily. Take 2 tablets once daily    . vitamin C (ASCORBIC ACID) 500 MG tablet Take 500 mg by mouth daily.     No current facility-administered medications for this visit.     Allergies as of 09/22/2018 - Review Complete 06/15/2018  Allergen Reaction Noted  .  Enalapril Anaphylaxis 10/23/2011  . Hydrocodone Other (See Comments) 02/13/2014  . Latex Swelling 02/13/2014  . Relpax [eletriptan] Other (See Comments) 10/23/2011    Family History  Problem Relation Age of Onset  . Diabetes Mother   . Diabetes Father   . Cancer Brother     Social History   Socioeconomic History  . Marital status: Married    Spouse name: Archie Endo  . Number of children: 1  . Years of education: Not on file  . Highest education level: Not on file  Occupational History  . Occupation: Chartered certified accountant    Employer: [employer]  . Financial resource strain: Not on file  . Food insecurity    Worry: Not on file    Inability: Not on file  . Transportation needs    Medical: Not on file    Non-medical: Not on file  Tobacco Use  . Smoking status: Never Smoker  . Smokeless tobacco: Never Used  Substance and Sexual Activity  . Alcohol use: No  . Drug use: No  . Sexual activity: Yes    Birth control/protection: Surgical  Lifestyle  . Physical activity    Days per week: Not on file    Minutes per session: Not on file  . Stress: Not on file  Relationships  . Social Herbalist on phone: Not on file    Gets together: Not on file    Attends religious service: Not on file    Active member of club or organization: Not on file    Attends meetings of clubs or organizations: Not on file    Relationship status: Not on file  . Intimate partner violence    Fear of current or ex partner: Not on file    Emotionally abused: Not on file    Physically abused: Not on file    Forced sexual activity: Not on file  Other Topics Concern  . Not on file  Social History Narrative   Married.  Lives with husband.  Works as a Quarry manager.    Review of Systems: ALL ROS discussed and all others negative except listed in HPI.  Physical Exam: Complete physical exam not performed due to the limits inherent in a telehealth encounter.  General: Awake, alert, and oriented, and well communicative. In no acute distress.  HEENT: EOMI, non-icteric sclera, NCAT, MMM  Neck: Normal movement of head and neck  Pulm: No labored breathing, speaking in full sentences without conversational dyspnea  Derm: No apparent lesions or bruising in visible field  MS: Moves all visible extremities without noticeable abnormality  Psych: Pleasant, cooperative, normal speech, normal affect and normal insight Neuro: Alert and appropriate   Marice Angelino L. Tarri Glenn, MD, MPH [facility] 09/21/2018, 11:57 AM

## 2018-09-22 ENCOUNTER — Ambulatory Visit: Payer: Medicare Other | Admitting: Gastroenterology

## 2018-09-24 ENCOUNTER — Ambulatory Visit: Payer: Self-pay | Admitting: *Deleted

## 2018-10-08 ENCOUNTER — Telehealth: Payer: Self-pay | Admitting: General Surgery

## 2018-10-08 NOTE — Telephone Encounter (Signed)
Left a voicemail for the patient to contact the office to prescreen.

## 2018-10-09 ENCOUNTER — Encounter: Payer: Self-pay | Admitting: Internal Medicine

## 2018-10-09 ENCOUNTER — Other Ambulatory Visit: Payer: Self-pay

## 2018-10-09 ENCOUNTER — Other Ambulatory Visit: Payer: Self-pay | Admitting: *Deleted

## 2018-10-09 ENCOUNTER — Ambulatory Visit (INDEPENDENT_AMBULATORY_CARE_PROVIDER_SITE_OTHER): Payer: Medicare Other | Admitting: Internal Medicine

## 2018-10-09 VITALS — Ht 60.0 in | Wt 117.0 lb

## 2018-10-09 DIAGNOSIS — Z1211 Encounter for screening for malignant neoplasm of colon: Secondary | ICD-10-CM | POA: Diagnosis not present

## 2018-10-09 DIAGNOSIS — E0842 Diabetes mellitus due to underlying condition with diabetic polyneuropathy: Secondary | ICD-10-CM

## 2018-10-09 DIAGNOSIS — M797 Fibromyalgia: Secondary | ICD-10-CM | POA: Diagnosis not present

## 2018-10-09 DIAGNOSIS — K581 Irritable bowel syndrome with constipation: Secondary | ICD-10-CM

## 2018-10-09 MED ORDER — NA SULFATE-K SULFATE-MG SULF 17.5-3.13-1.6 GM/177ML PO SOLN: 1.0000 | Freq: Once | ORAL | 0 refills | Status: AC

## 2018-10-09 NOTE — Progress Notes (Signed)
TELEHEALTH ENCOUNTER IN SETTING OF COVID-19 PANDEMIC - REQUESTED BY PATIENT SERVICE PROVIDED BY TELEMEDECINE - TYPE: doximity AV - had to change to phone part way due to connection PATIENT LOCATION: Home PATIENT HAS CONSENTED TO TELEHEALTH VISIT PROVIDER LOCATION: OFFICE REFERRING PROVIDER:Ashley Vanstory, PA-C PARTICIPANTS OTHER THAN PATIENT: None TIME SPENT ON CALL:22 mins 45 sec    Nichole Garza 58 y.o. May 18, 1960 [id number]  Assessment & Plan:   Encounter Diagnoses  Name Primary?  . Irritable bowel syndrome with constipation Yes  . Fibromyalgia   . Diabetic polyneuropathy associated with diabetes mellitus due to underlying condition ([place])   . Colon cancer screening     Nichole Garza story is very compatible with irritable bowel syndrome and constipation.  Quite common in fibromyalgia and diabetic neuropathy also.  Screening colonoscopy will be performed for completeness.  The risks and benefits as well as alternatives of endoscopic procedure(s) have been discussed and reviewed. All questions answered. The patient agrees to proceed.   Once I meet Nichole Garza and she has Nichole Garza colonoscopy will come up with a treatment plan going forward.  Things to consider are reduced salt consumption as that is been associated with bloating.  Consider testing for small intestinal bacterial overgrowth looking for methane overproduction though would need to wait 1 month after colonoscopy prep.  She should have a functional rectal exam when she presents for screening colonoscopy, before I sedate Nichole Garza as well.    TK:[id number], Nichole Beard, MD and Raelyn Number PA-C Dr. Edrick Oh  Subjective:   Chief Complaint: IBS  HPI The patient is a 58 year old woman who is complaining of problems with constipation and bloating.  She has fibromyalgia, and over the past several years she has developed difficulty with defecation to the point that she uses an enema every 3 days because she has significant incomplete  defecation symptoms.  The enema produces a good bowel movement.  She also has abdominal distention and bloating that makes Nichole Garza feel like she is many months pregnant.  She has never had a screening colonoscopy.  She is not having bleeding.  She has tried Metamucil MiraLAX and is eating some prunes but none of that has really made a big difference in how she feels with Nichole Garza bowel movements.  There is no nausea and vomiting.  She has been fearful of a colonoscopy because she is worried because she is a "little woman" and could get perforated.  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  GI review of systems appears negative otherwise. Allergies  Allergen Reactions  . Enalapril Anaphylaxis  . Hydrocodone Other (See Comments)    Does not remember what happened-she woke up in Nichole Garza kitchen  . Latex Swelling    Lips and face  . Relpax [Eletriptan] Other (See Comments)    "fainting"-knocked Nichole Garza out for the whole day    Current Outpatient Medications:  .  amLODipine (NORVASC) 5 MG tablet, Take 5 mg by mouth daily., Disp: , Rfl:  .  aspirin EC 81 MG tablet, Take 81 mg by mouth every morning., Disp: , Rfl:  .  Black Cohosh 20 MG TABS, Take by mouth., Disp: , Rfl:  .  DULoxetine (CYMBALTA) 30 MG capsule, Take 60 mg by mouth daily. , Disp: , Rfl:  .  fish oil-omega-3 fatty acids 1000 MG capsule, Take 1 g by mouth 3 (three) times daily. , Disp: ,  Rfl:  .  gabapentin (NEURONTIN) 800 MG tablet, Take 800 mg by mouth 3 (three) times daily., Disp: , Rfl:  .  GINKGO BILOBA COMPLEX PO, Take 1 capsule by mouth 2 (two) times a day. , Disp: , Rfl:  .  glimepiride (AMARYL) 4 MG tablet, Take 1 tablet by mouth 2 (two) times daily., Disp: , Rfl: 1 .  metFORMIN (GLUCOPHAGE) 500 MG tablet, Take 750 mg by mouth 2 (two) times daily with a meal. , Disp: , Rfl:  .  Misc Natural Products (COLON CLEANSE) CAPS, Take 1 capsule  by mouth 2 (two) times daily., Disp: , Rfl:  .  Multiple Vitamin (MULTIVITAMIN WITH MINERALS) TABS, Take 1 tablet by mouth daily., Disp: , Rfl:  .  pravastatin (PRAVACHOL) 10 MG tablet, Take 10 mg by mouth at bedtime., Disp: , Rfl:  .  tiZANidine (ZANAFLEX) 4 MG capsule, Take 4 mg by mouth daily. Take 2 tablets once daily, Disp: , Rfl:  .  vitamin C (ASCORBIC ACID) 500 MG tablet, Take 500 mg by mouth daily., Disp: , Rfl:   Past Medical History:  Diagnosis Date  . Arthritis    in Nichole Garza spine  . Diabetes mellitus   . Fibromyalgia   . Hypertension   . Kidney stone    kidney stone  . Migraine headache   . Nerve pain   . TIA (transient ischemic attack)    Past Surgical History:  Procedure Laterality Date  . ABDOMINAL HYSTERECTOMY  2012  . CYSTOSCOPY WITH RETROGRADE PYELOGRAM, URETEROSCOPY AND STENT PLACEMENT Right 05/29/2012   Procedure: CYSTOSCOPY WITH RETROGRADE PYELOGRAM, URETEROSCOPY AND STENT PLACEMENT;  Surgeon: Hanley Ben, MD;  Location: [facility];  Service: Urology;  Laterality: Right;  . HOLMIUM LASER APPLICATION Right 3/66/4403   Procedure: HOLMIUM LASER APPLICATION;  Surgeon: Hanley Ben, MD;  Location: [facility];  Service: Urology;  Laterality: Right;   Social History   Social History Narrative   Married.  Lives with husband.     Disabled CNA    grown daughter + stepdaughter/stepson   No EtOH, no smoking/tobacco/drugs   family history includes Cancer in Nichole Garza brother; Diabetes in Nichole Garza father and mother.   Review of Systems  As per HPI  Limited PE - mobile, NAD, clear speech, appropriate mood/affect with anicteric eyes

## 2018-10-09 NOTE — Patient Outreach (Signed)
Nichole Garza) Care Management  10/09/2018  Nichole Garza 14-Feb-1961 [id number]   [place]  Referral Date:11/17/2017 Referral Source:Health Risk Assessment Screening Reason for Referral:Disease Management Education Insurance:[employer] Medicare   Outreach Attempt:  Outreach attempt #1 to patient for follow up.  Patient answered and states she is driving and will be unavailable for the rest of the afternoon.  Requested a telephone call back on a Tuesday or Thursday.   Plan:  RN Health Coach will make another outreach attempt within the month of July per patient request.  [name] RN [place] [phone] [email or web address]

## 2018-10-09 NOTE — Patient Instructions (Addendum)
It was a pleasure to meet you today and I am hopeful that I can help you feel better.    As we discussed, a colonoscopy for screening purposes is important in your case.  I do think that you have irritable bowel syndrome based upon your complaints and how long it is been bothering you.  There are no worrisome labs that I can see.   Once you have your screening colonoscopy I think I will be able to figure out a way to help you feel better.  We will plan to use a preparation called Suprep, this has been sent to the pharmacy for you to pick up.  I appreciate the opportunity to care for you. Gatha Mayer, MD, [facility]

## 2018-10-13 DIAGNOSIS — I10 Essential (primary) hypertension: Secondary | ICD-10-CM | POA: Diagnosis not present

## 2018-10-13 DIAGNOSIS — E119 Type 2 diabetes mellitus without complications: Secondary | ICD-10-CM | POA: Diagnosis not present

## 2018-10-13 DIAGNOSIS — Z124 Encounter for screening for malignant neoplasm of cervix: Secondary | ICD-10-CM | POA: Diagnosis not present

## 2018-10-13 DIAGNOSIS — M797 Fibromyalgia: Secondary | ICD-10-CM | POA: Diagnosis not present

## 2018-10-13 DIAGNOSIS — G43909 Migraine, unspecified, not intractable, without status migrainosus: Secondary | ICD-10-CM | POA: Diagnosis not present

## 2018-10-15 ENCOUNTER — Other Ambulatory Visit: Payer: Self-pay | Admitting: *Deleted

## 2018-10-15 NOTE — Patient Outreach (Signed)
[facility]) Care Management  10/15/2018  Nichole Garza 1960/09/17 [id number]   [place]  Referral Date:11/17/2017 Referral Source:Health Risk Assessment Screening Reason for Referral:Disease Management Education Insurance:[employer] Medicare   Outreach Attempt:  Outreach attempt #2 to patient for follow up.  Patient answered and stated she was driving and unable to speak at this time.   Plan:  RN Health Coach will make another outreach attempt to patient within the month of August if no return call back from patient.  [place] [phone] [email or web address]

## 2018-10-23 DIAGNOSIS — Z1211 Encounter for screening for malignant neoplasm of colon: Secondary | ICD-10-CM | POA: Diagnosis not present

## 2018-10-27 ENCOUNTER — Encounter: Payer: Self-pay | Admitting: Internal Medicine

## 2018-10-29 ENCOUNTER — Telehealth: Payer: Self-pay | Admitting: Internal Medicine

## 2018-10-29 DIAGNOSIS — E1140 Type 2 diabetes mellitus with diabetic neuropathy, unspecified: Secondary | ICD-10-CM | POA: Diagnosis not present

## 2018-10-29 DIAGNOSIS — R188 Other ascites: Secondary | ICD-10-CM | POA: Diagnosis not present

## 2018-10-29 DIAGNOSIS — G43909 Migraine, unspecified, not intractable, without status migrainosus: Secondary | ICD-10-CM | POA: Diagnosis not present

## 2018-10-29 DIAGNOSIS — I10 Essential (primary) hypertension: Secondary | ICD-10-CM | POA: Diagnosis not present

## 2018-10-29 DIAGNOSIS — M797 Fibromyalgia: Secondary | ICD-10-CM | POA: Diagnosis not present

## 2018-10-29 DIAGNOSIS — Z0001 Encounter for general adult medical examination with abnormal findings: Secondary | ICD-10-CM | POA: Diagnosis not present

## 2018-10-29 DIAGNOSIS — Z1389 Encounter for screening for other disorder: Secondary | ICD-10-CM | POA: Diagnosis not present

## 2018-10-29 DIAGNOSIS — E119 Type 2 diabetes mellitus without complications: Secondary | ICD-10-CM | POA: Diagnosis not present

## 2018-10-29 NOTE — Telephone Encounter (Signed)
Patient called she said she went to her PCP today and was told that she is very impacted and would probably need another prep for her procedure. She would like a call back.

## 2018-10-29 NOTE — Telephone Encounter (Signed)
Agree 

## 2018-10-29 NOTE — Telephone Encounter (Signed)
Patient saw her PCP today and they told her she had very large amount of stool in her colon.  They recommended she take her Suprep for the colonoscopy on 11/10/18.  She is advised to try a Miralax purge today and hold the Suprep for the procedure.  She is asked to take 2 dulcolax now, then in 2 hours drink 1 capful of miralax in 8 oz liquid every 15 minutes x 8.  She will call me back if this fails.

## 2018-10-31 HISTORY — PX: COLONOSCOPY: SHX174

## 2018-11-09 ENCOUNTER — Telehealth: Payer: Self-pay | Admitting: Internal Medicine

## 2018-11-09 NOTE — Telephone Encounter (Signed)
Left message to call back to ask Covid-19 screening questions. Covid-19 Screening Questions:  Do you now or have you had a fever in the last 14 days? no  Do you have any respiratory symptoms of shortness of breath or cough now or in the last 14 days? no  Do you have any family members or close contacts with diagnosed or suspected Covid-19 in the past 14 days? no  Have you been tested for Covid-19 and found to be positive? no  Pt made aware of that care partner may wait in the car or come up to the lobby during the procedure but will need to provide their own mask.

## 2018-11-10 ENCOUNTER — Encounter: Payer: Self-pay | Admitting: Internal Medicine

## 2018-11-10 ENCOUNTER — Ambulatory Visit (AMBULATORY_SURGERY_CENTER): Payer: Medicare Other | Admitting: Internal Medicine

## 2018-11-10 ENCOUNTER — Other Ambulatory Visit: Payer: Self-pay

## 2018-11-10 ENCOUNTER — Other Ambulatory Visit: Payer: Self-pay | Admitting: *Deleted

## 2018-11-10 VITALS — BP 140/71 | HR 74 | Temp 99.2°F | Resp 15 | Ht 60.0 in | Wt 117.0 lb

## 2018-11-10 DIAGNOSIS — K635 Polyp of colon: Secondary | ICD-10-CM

## 2018-11-10 DIAGNOSIS — K589 Irritable bowel syndrome without diarrhea: Secondary | ICD-10-CM | POA: Diagnosis not present

## 2018-11-10 DIAGNOSIS — N816 Rectocele: Secondary | ICD-10-CM

## 2018-11-10 DIAGNOSIS — D120 Benign neoplasm of cecum: Secondary | ICD-10-CM

## 2018-11-10 DIAGNOSIS — K581 Irritable bowel syndrome with constipation: Secondary | ICD-10-CM

## 2018-11-10 DIAGNOSIS — Z1211 Encounter for screening for malignant neoplasm of colon: Secondary | ICD-10-CM

## 2018-11-10 HISTORY — DX: Rectocele: N81.6

## 2018-11-10 MED ORDER — LUBIPROSTONE 8 MCG PO CAPS: 8.0000 ug | ORAL_CAPSULE | Freq: Two times a day (BID) | ORAL | 1 refills | Status: DC

## 2018-11-10 MED ORDER — SODIUM CHLORIDE 0.9 % IV SOLN: 500.0000 mL | Freq: Once | INTRAVENOUS | Status: DC

## 2018-11-10 NOTE — Op Note (Addendum)
[place] Patient Name: Nichole Garza Procedure Date: 11/10/2018 1:37 PM MRN: [id number] Endoscopist: Gatha Mayer , MD Age: 58 Referring MD:  Date of Birth: Feb 25, 1961 Gender: Female Account #: [id number] Procedure:                Colonoscopy Indications:              Screening for colorectal malignant neoplasm Medicines:                Propofol per Anesthesia, Monitored Anesthesia Care Procedure:                Pre-Anesthesia Assessment:                           - Prior to the procedure, a History and Physical                            was performed, and patient medications and                            allergies were reviewed. The patient's tolerance of                            previous anesthesia was also reviewed. The risks                            and benefits of the procedure and the sedation                            options and risks were discussed with the patient.                            All questions were answered, and informed consent                            was obtained. Prior Anticoagulants: The patient has                            taken no previous anticoagulant or antiplatelet                            agents. ASA Grade Assessment: II - A patient with                            mild systemic disease. After reviewing the risks                            and benefits, the patient was deemed in                            satisfactory condition to undergo the procedure.                           After obtaining informed consent, the colonoscope  was passed under direct vision. Throughout the                            procedure, the patient's blood pressure, pulse, and                            oxygen saturations were monitored continuously. The                            Colonoscope was introduced through the anus and                            advanced to the the terminal ileum, with       identification of the appendiceal orifice and IC                            valve. The colonoscopy was performed without                            difficulty. The patient tolerated the procedure                            well. The quality of the bowel preparation was                            excellent. The bowel preparation used was SUPREP                            via split dose instruction. The terminal ileum,                            ileocecal valve, appendiceal orifice, and rectum                            were photographed. Scope In: 1:46:09 PM Scope Out: 2:00:56 PM Scope Withdrawal Time: 0 hours 11 minutes 22 seconds  Total Procedure Duration: 0 hours 14 minutes 47 seconds  Findings:                 The perianal examination was normal.                           The digital rectal exam findings include rectocele.                           A diminutive polyp was found in the cecum. The                            polyp was sessile. The polyp was removed with a                            cold snare. Resection and retrieval were complete.  Verification of patient identification for the                            specimen was done. Estimated blood loss was minimal.                           A diffuse area of mildly melanotic mucosa was found                            in the entire colon.                           The exam was otherwise without abnormality on                            direct and retroflexion views. Including terminal                            ileum. Complications:            No immediate complications. Estimated Blood Loss:     Estimated blood loss was minimal. Impression:               - Rectocele found on digital rectal exam. Moderate.                            Functional rectal exam was normal.                           - One diminutive polyp in the cecum, removed with a                            cold snare. Resected and  retrieved.                           - Melanotic mucosa in the entire examined colon.                           - The examination was otherwise normal on direct                            and retroflexion views. Including terminal ileum Recommendation:           - Patient has a contact number available for                            emergencies. The signs and symptoms of potential                            delayed complications were discussed with the                            patient. Return to normal activities tomorrow.                            Written  discharge instructions were provided to the                            patient.                           - Low sodium diet. DASH diet                           - Continue present medications.                           - Repeat colonoscopy is recommended. The                            colonoscopy date will be determined after pathology                            results from today's exam become available for                            review.                           - Start Amitiza 8 ug bid                           Call soon and make a f/u appt to see me re: IBS                           read rectocele handout                           Low sodium diet to see if it may reduce bloating Gatha Mayer, MD 11/10/2018 2:22:08 PM This report has been signed electronically.

## 2018-11-10 NOTE — Progress Notes (Signed)
Called to room to assist during endoscopic procedure.  Patient ID and intended procedure confirmed with present staff. Received instructions for my participation in the procedure from the performing physician.  

## 2018-11-10 NOTE — Progress Notes (Signed)
PT taken to PACU. Monitors in place. VSS. Report given to RN. 

## 2018-11-10 NOTE — Patient Instructions (Addendum)
I found and removed one tiny polyp that looks benign. I will let you know pathology results and when to have another routine colonoscopy by mail and/or My Chart.   You have melanosis coli - a change in the color of the colon lining that comes from the colon cleanse you use. It is not a problem.  I did find a rectocele which may be causing some difficulty with defecation. Please read the handout about that.  I am prescribing Amitiza to take to see if it helps you move your bowels better. Starting with a low dose - we can go higher if needed.  Also be sure to not eat extra salt - aboot 2 grams a day (see diet sheet) that will help reduce bloating. Try DASH diet.  I want you to call and make an appointment to see me in early  September - call now and make it before my schedule fills up.  I appreciate the opportunity to care for you. Gatha Mayer, MD, Marval Regal  Handouts given : DASH DIET, RECTOCELE,POLYPS.YOU HAD AN ENDOSCOPIC PROCEDURE TODAY AT [facility]:   Refer to the procedure report that was given to you for any specific questions about what was found during the examination.  If the procedure report does not answer your questions, please call your gastroenterologist to clarify.  If you requested that your care partner not be given the details of your procedure findings, then the procedure report has been included in a sealed envelope for you to review at your convenience later.  YOU SHOULD EXPECT: Some feelings of bloating in the abdomen. Passage of more gas than usual.  Walking can help get rid of the air that was put into your GI tract during the procedure and reduce the bloating. If you had a lower endoscopy (such as a colonoscopy or flexible sigmoidoscopy) you may notice spotting of blood in your stool or on the toilet paper. If you underwent a bowel prep for your procedure, you may not have a normal bowel movement for a few days.  Please Note:  You might notice  some irritation and congestion in your nose or some drainage.  This is from the oxygen used during your procedure.  There is no need for concern and it should clear up in a day or so.  SYMPTOMS TO REPORT IMMEDIATELY:   Following lower endoscopy (colonoscopy or flexible sigmoidoscopy):  Excessive amounts of blood in the stool  Significant tenderness or worsening of abdominal pains  Swelling of the abdomen that is new, acute  Fever of 100F or higher   For urgent or emergent issues, a gastroenterologist can be reached at any hour by calling [phone].   DIET:  We do recommend a small meal at first, but then you may proceed to your regular diet.  Drink plenty of fluids but you should avoid alcoholic beverages for 24 hours.  ACTIVITY:  You should plan to take it easy for the rest of today and you should NOT DRIVE or use heavy machinery until tomorrow (because of the sedation medicines used during the test).    FOLLOW UP: Our staff will call the number listed on your records 48-72 hours following your procedure to check on you and address any questions or concerns that you may have regarding the information given to you following your procedure. If we do not reach you, we will leave a message.  We will attempt to reach you two times.  During  this call, we will ask if you have developed any symptoms of COVID 19. If you develop any symptoms (ie: fever, flu-like symptoms, shortness of breath, cough etc.) before then, please call [phone].  If you test positive for Covid 19 in the 2 weeks post procedure, please call and report this information to [place].    If any biopsies were taken you will be contacted by phone or by letter within the next 1-3 weeks.  Please call us at [phone] if you have not heard about the biopsies in 3 weeks.    SIGNATURES/CONFIDENTIALITY: You and/or your care partner have signed paperwork which will be entered into your electronic medical record.  These  signatures attest to the fact that that the information above on your After Visit Summary has been reviewed and is understood.  Full responsibility of the confidentiality of this discharge information lies with you and/or your care-partner.

## 2018-11-10 NOTE — Patient Outreach (Signed)
[facility]) Care Management  11/10/2018  Jackeline Shaw-Falconer [birth year] [id number]   [place]  Referral Date:11/17/2017 Referral Source:Health Risk Assessment Screening Reason for Referral:Disease Management Education Insurance:[employer] Medicare   Outreach Attempt:  Outreach attempt #3 to patient for follow up.  Patient answered and stated she was unable to speak at this time and requested a telephone call back on another day.   Plan:  RN Health Coach will make another outreach attempt within the month of August.  Emrik Erhard RN [place] [phone] [email or web address]

## 2018-11-10 NOTE — Progress Notes (Signed)
Pt's states no medical or surgical changes since previsit or office visit. 

## 2018-11-12 ENCOUNTER — Telehealth: Payer: Self-pay | Admitting: Internal Medicine

## 2018-11-12 ENCOUNTER — Telehealth: Payer: Self-pay

## 2018-11-12 ENCOUNTER — Telehealth: Payer: Self-pay | Admitting: *Deleted

## 2018-11-12 DIAGNOSIS — Z1239 Encounter for other screening for malignant neoplasm of breast: Secondary | ICD-10-CM | POA: Diagnosis not present

## 2018-11-12 DIAGNOSIS — Z1231 Encounter for screening mammogram for malignant neoplasm of breast: Secondary | ICD-10-CM | POA: Diagnosis not present

## 2018-11-12 NOTE — Telephone Encounter (Signed)
Patient reports that she should Miralax for continued constipation.  She is reassured that it may take a few days to reestablish her normal bowel habits.  She will resume Miralax 1-2 times a day if she has not had a BM by tomorrow.  She understands to continue Amitiza and call back if it fails to help with constipation to consider a higher dose

## 2018-11-12 NOTE — Telephone Encounter (Signed)
Left voice mail

## 2018-11-12 NOTE — Telephone Encounter (Signed)
  Follow up Call-  Call back number 11/10/2018  Post procedure Call Back phone  # [phone]  Permission to leave phone message Yes  Some recent data might be hidden     Patient questions:   Message left to call if necessary.

## 2018-11-15 ENCOUNTER — Encounter: Payer: Self-pay | Admitting: Internal Medicine

## 2018-11-15 DIAGNOSIS — Z860100 Personal history of colon polyps, unspecified: Secondary | ICD-10-CM | POA: Insufficient documentation

## 2018-11-15 HISTORY — DX: Personal history of colon polyps, unspecified: Z86.0100

## 2018-11-15 HISTORY — DX: Personal history of colonic polyps: Z86.010

## 2018-11-15 NOTE — Progress Notes (Signed)
diminutive ssp Recall 2027

## 2018-11-24 ENCOUNTER — Encounter: Payer: Self-pay | Admitting: *Deleted

## 2018-11-24 ENCOUNTER — Ambulatory Visit: Payer: Medicare Other | Admitting: Hematology

## 2018-11-24 ENCOUNTER — Other Ambulatory Visit: Payer: Self-pay | Admitting: *Deleted

## 2018-11-24 ENCOUNTER — Other Ambulatory Visit: Payer: Medicare Other

## 2018-11-24 DIAGNOSIS — G5601 Carpal tunnel syndrome, right upper limb: Secondary | ICD-10-CM | POA: Diagnosis not present

## 2018-11-24 DIAGNOSIS — M797 Fibromyalgia: Secondary | ICD-10-CM | POA: Diagnosis not present

## 2018-11-24 DIAGNOSIS — G894 Chronic pain syndrome: Secondary | ICD-10-CM | POA: Diagnosis not present

## 2018-11-24 NOTE — Patient Outreach (Signed)
[facility]) Care Management  [place]  11/24/2018   Nichole Garza Apr 28, 1960 [id number]   [place] Outreach   Referral Date:  11/17/2017 Referral Source:  Health Risk Assessment Screening Reason for Referral:  Disease Management Education Insurance:  [employer]   Outreach Attempt:  Successful telephone outreach to patient for follow up.  HIPAA verified with patient.  Patient reporting she continues to have pain and burning in her hands and wrist related to carpal tunnel.  States she had virtual appointment with her Pain Management provider today and has been encouraged to wear splints on both hands/wrist to help with the pain.  Continues to monitor blood sugars.  Fasting blood sugar this morning was 164 with fasting ranges of 130-140's typically.  Denies any hypo or hyperglycemic episodes.  Reports A1C at last primary care visit she was told her numbers are decreasing but does not remember what her actual A1C result.  Encouraged patient to discuss current results with provider.  Patient also reporting continued abdominal bloating and constipation since recent colonoscopy.  Discussed daily use of Miriaax and encouraged follow up with gastroenterologist.  Patient reporting an increase in forgetfulness and even "ending up in strange places while driving".  States she is more depressed related to her declining health and ability to care for herself.  Declines the need for therapy but would like to speak to Nichole Garza for depression resources.  Encouraged patient to discuss forgetfulness with primary care provider.  Encounter Medications:  Outpatient Encounter Medications as of 11/24/2018  Medication Sig Note  . amLODipine (NORVASC) 5 MG tablet Take 5 mg by mouth daily.   Marland Kitchen aspirin EC 81 MG tablet Take 81 mg by mouth every morning.   . Black Cohosh 20 MG TABS Take by mouth.   . DULoxetine (CYMBALTA) 30 MG capsule Take 60 mg  by mouth daily.    . fish oil-omega-3 fatty acids 1000 MG capsule Take 1 g by mouth 3 (three) times daily.    Marland Kitchen gabapentin (NEURONTIN) 800 MG tablet Take 800 mg by mouth 3 (three) times daily.   Marland Kitchen GINKGO BILOBA COMPLEX PO Take 1 capsule by mouth 2 (two) times a day.    Marland Kitchen glimepiride (AMARYL) 4 MG tablet Take 1 tablet by mouth 2 (two) times daily.   Marland Kitchen lubiprostone (AMITIZA) 8 MCG capsule Take 1 capsule (8 mcg total) by mouth 2 (two) times daily with a meal.   . metFORMIN (GLUCOPHAGE) 500 MG tablet Take 750 mg by mouth 2 (two) times daily with a meal.    . Misc Natural Products (COLON CLEANSE) CAPS Take 1 capsule by mouth 2 (two) times daily.   . Multiple Vitamin (MULTIVITAMIN WITH MINERALS) TABS Take 1 tablet by mouth daily.   . pravastatin (PRAVACHOL) 10 MG tablet Take 10 mg by mouth at bedtime. 08/20/2013: .   Marland Kitchen tiZANidine (ZANAFLEX) 4 MG capsule Take 4 mg by mouth daily. Take 2 tablets once daily   . vitamin C (ASCORBIC ACID) 500 MG tablet Take 500 mg by mouth daily.    No facility-administered encounter medications on file as of 11/24/2018.     Functional Status:  In your present state of health, do you have any difficulty performing the following activities: 11/24/2018 06/15/2018  Hearing? Y N  Vision? N N  Difficulty concentrating or making decisions? Y N  Comment becoming very forgetful and losing concentration while driving -  Walking or climbing stairs? N N  Dressing or bathing? N N  Doing errands, shopping? N N  Preparing Food and eating ? N N  Using the Toilet? N N  In the past six months, have you accidently leaked urine? N N  Do you have problems with loss of bowel control? N N  Managing your Medications? N N  Managing your Finances? N N  Housekeeping or managing your Housekeeping? N N  Some recent data might be hidden    Fall/Depression Screening: Fall Risk  11/24/2018 06/15/2018 06/11/2018  Falls in the past year? 0 0 0  Injury with Fall? - 0 -  Risk for fall due to :  Medication side effect;History of fall(s);Impaired balance/gait Impaired balance/gait;Impaired mobility -  Follow up Falls evaluation completed;Education provided;Falls prevention discussed Education provided;Falls prevention discussed Falls evaluation completed;Falls prevention discussed;Education provided   [facility] 2/9 Scores 11/24/2018 06/15/2018 01/14/2018 01/08/2018 11/17/2017  PHQ - 2 Score 2 3 3 3 1   PHQ- 9 Score 13 7 7 7  -   [facility] CM Care Plan Problem One     Most Recent Value  Care Plan Problem One  Knowledge defiecit related to self care management of diabetes.  Role Documenting the Problem One  [place] for Problem One  Active  [facility] Long Term Goal   Patient will verbalize reduction in her Hgb A1C by 0.3 points in the next 90 days.  THN Long Term Goal Start Date  11/24/18  Interventions for Problem One Long Term Goal  Reviewed and discussed care plan and goals, encouraged patient to ask provider her current A1C value and her goal, discussed importance of knowing her numbers, reviewed current glucose ranges and ways to help reduce, encouraged diabetic low carbohydrate diet, discussed depression and offered [place] Work referral for depression resources, reviewed medications and encouraged medication compliance, encouraged to keep and attend scheduled medical appointments, discussed constipation and bloating and encouraged patient to take miralax daily to help with constipation and regulation of bowels, encouraged patient to discuss hand/wrist pain/numbness with providers, encouraged patient to discuss physical and occupational therapy with primary care provider     Appointments:  Reports last attending appointment with primary care provider in June 2020 and has follow up scheduled in September 2020.  Has scheduled follow up with gastroenterologist on 12/15/2018.  Plan: RN Health Coach will place [facility] Social Work Referral for depression resources. RN Health Coach will send primary  care provider quarterly update. RN Health Coach will make next telephone outreach to patient within the month of October.  [place] Coach [phone] [email or web address]

## 2018-11-25 ENCOUNTER — Encounter: Payer: Self-pay | Admitting: *Deleted

## 2018-11-25 ENCOUNTER — Other Ambulatory Visit: Payer: Self-pay | Admitting: *Deleted

## 2018-11-25 NOTE — Patient Outreach (Signed)
[name]  11/25/2018  Nichole Garza June 14, 1960 [id number]   CSW made an initial attempt to try and contact patient today to perform the initial phone assessment, as well as assess and assist with social work needs and services, without success.  A HIPAA compliant message was left for patient on voicemail.  CSW is currently awaiting a return call.  CSW will make a second outreach attempt within the next 3-4 business days, if a return call is not received from patient in the meantime.  CSW will also mail an Outreach Letter to patient's home requesting that patient contact CSW if patient is interested in receiving social work services through [place] with Scientist, clinical (histocompatibility and immunogenetics).  [name], BSW, MSW, LCSW  Licensed Education officer, environmental Health System  [place] [address] [place], [place] [address] Physical Address-[address], [place], [place] [address] Toll Free Main # [phone] Fax # [phone] Cell # [phone]  Office # [phone] [name]

## 2018-11-25 NOTE — Patient Outreach (Signed)
[facility]) Care Management  11/25/2018  Nichole Garza [birth year] [id number]  CSW received a return call from patient, in response to the HIPAA compliant message left on voicemail for patient by CSW earlier in the day.  Patient admitted remembering having worked with CSW in the past, requesting to have [place] work with her again because she felt that she really benefited from CSW's involvement with her plan of care.  CSW reminded patient that she is more than happy to assist.  CSW obtained two HIPAA compliant identifiers from patient, which included patient's name and date of birth.  Patient indicated that she was recently diagnosed with Carpal Tunnel Syndrome in her right hand, which has been causing her a great deal of pain.  Patient also reported that she is now experiencing two new symptoms, as a result of her Fibromyalgia, which includes a swollen abdomen and the inability to bend her fingers all the way closed or make a fist.  Patient stated, "I am just trying to handle everything in stride".  CSW was able to offer counseling and supportive services to patient.    Patient stated that she has an appointment to follow-up with Dr. Tish Men, Hematologist & General Oncologist, at [facility] in [place], on Thursday, November 26, 2018, at 2:30PM, to check the status of her abnormal protein, as well as to have lab work drawn.  Patient admits to having anxiety prior to each appointment, for which CSW encouraged patient to remember to practice her deep breathing exercises and relaxation techniques.    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  CSW voiced understanding, encouraged patient to call CSW back at her convenience.  Patient was definitely agreeable to this plan.  If CSW does not receive a return call from patient within the next week,  CSW will make arrangements to contact patient again on Wednesday, December 02, 2018, around 12:00 noon.  [name], BSW, MSW, LCSW  Licensed Education officer, environmental Health System  Mailing [place] [address], [place], [place] [address] Physical Address[address], [place], [place] [address] Toll Free Main # [phone] Fax # [phone] Cell # [phone]  Office # [phone] [name]

## 2018-11-26 ENCOUNTER — Inpatient Hospital Stay (HOSPITAL_BASED_OUTPATIENT_CLINIC_OR_DEPARTMENT_OTHER): Payer: Medicare Other | Admitting: Hematology

## 2018-11-26 ENCOUNTER — Inpatient Hospital Stay: Payer: Medicare Other | Attending: Hematology

## 2018-11-26 ENCOUNTER — Telehealth: Payer: Self-pay | Admitting: Hematology

## 2018-11-26 ENCOUNTER — Encounter: Payer: Self-pay | Admitting: Hematology

## 2018-11-26 ENCOUNTER — Other Ambulatory Visit: Payer: Self-pay

## 2018-11-26 VITALS — BP 108/93 | HR 97 | Temp 97.1°F | Resp 18 | Ht 60.0 in | Wt 113.4 lb

## 2018-11-26 DIAGNOSIS — E1165 Type 2 diabetes mellitus with hyperglycemia: Secondary | ICD-10-CM | POA: Insufficient documentation

## 2018-11-26 DIAGNOSIS — M797 Fibromyalgia: Secondary | ICD-10-CM | POA: Diagnosis not present

## 2018-11-26 DIAGNOSIS — D472 Monoclonal gammopathy: Secondary | ICD-10-CM | POA: Diagnosis not present

## 2018-11-26 DIAGNOSIS — G8929 Other chronic pain: Secondary | ICD-10-CM | POA: Diagnosis not present

## 2018-11-26 DIAGNOSIS — E1142 Type 2 diabetes mellitus with diabetic polyneuropathy: Secondary | ICD-10-CM | POA: Insufficient documentation

## 2018-11-26 DIAGNOSIS — Z7984 Long term (current) use of oral hypoglycemic drugs: Secondary | ICD-10-CM | POA: Diagnosis not present

## 2018-11-26 LAB — CMP (CANCER CENTER ONLY)
ALT: 13 U/L (ref 0–44)
AST: 14 U/L — ABNORMAL LOW (ref 15–41)
Albumin: 4.6 g/dL (ref 3.5–5.0)
Alkaline Phosphatase: 110 U/L (ref 38–126)
Anion gap: 11 (ref 5–15)
BUN: 17 mg/dL (ref 6–20)
CO2: 28 mmol/L (ref 22–32)
Calcium: 9.1 mg/dL (ref 8.9–10.3)
Chloride: 99 mmol/L (ref 98–111)
Creatinine: 1.14 mg/dL — ABNORMAL HIGH (ref 0.44–1.00)
GFR, Est AFR Am: >60 mL/min (ref >60)
GFR, Estimated: 53 mL/min — ABNORMAL LOW (ref >60)
Glucose, Bld: 278 mg/dL — ABNORMAL HIGH (ref 70–99)
Potassium: 3.8 mmol/L (ref 3.5–5.1)
Sodium: 138 mmol/L (ref 135–145)
Total Bilirubin: 0.2 mg/dL — ABNORMAL LOW (ref 0.3–1.2)
Total Protein: 7.8 g/dL (ref 6.5–8.1)

## 2018-11-26 LAB — CBC WITH DIFFERENTIAL (CANCER CENTER ONLY)
Abs Immature Granulocytes: 0.01 10*3/uL (ref 0.00–0.07)
Basophils Absolute: 0.1 10*3/uL (ref 0.0–0.1)
Basophils Relative: 1 %
Eosinophils Absolute: 0 10*3/uL (ref 0.0–0.5)
Eosinophils Relative: 0 %
HCT: 39.5 % (ref 36.0–46.0)
Hemoglobin: 12.5 g/dL (ref 12.0–15.0)
Immature Granulocytes: 0 %
Lymphocytes Relative: 48 %
Lymphs Abs: 2.9 10*3/uL (ref 0.7–4.0)
MCH: 27.9 pg (ref 26.0–34.0)
MCHC: 31.6 g/dL (ref 30.0–36.0)
MCV: 88.2 fL (ref 80.0–100.0)
Monocytes Absolute: 0.3 10*3/uL (ref 0.1–1.0)
Monocytes Relative: 5 %
Neutro Abs: 2.8 10*3/uL (ref 1.7–7.7)
Neutrophils Relative %: 46 %
Platelet Count: 314 10*3/uL (ref 150–400)
RBC: 4.48 MIL/uL (ref 3.87–5.11)
RDW: 13.8 % (ref 11.5–15.5)
WBC Count: 6.1 10*3/uL (ref 4.0–10.5)
nRBC: 0 % (ref 0.0–0.2)

## 2018-11-26 NOTE — Telephone Encounter (Signed)
LMVM and calendar mailed with updated appt schedule per 8/27 los

## 2018-11-26 NOTE — Progress Notes (Signed)
[place] OFFICE PROGRESS NOTE  Patient Care Team: Benito Mccreedy, MD as PCP - General (Internal Medicine) Leona Singleton, RN as [place] Management [place], LCSW as [place] Management  HEME/ONC OVERVIEW: 1. IgA kappa MGUS -M-spike 0.4 g/dL, IgA 566 during routine nephrology evaluation (Cr 1.0-1.2; Ca 10.2, Hgb ~11 w/ MCV 88)  -01/2018: baseline labs   -M-spike 0.5 g/dL, monoclonal IgA kappa, IgA 585, free kappa 21; LDH 183, B2M 1.3   -UPEP, UFE negative, urine free light chains normal  -PET negative for bony lesions or plasmacytoma  -Hgb, calcium, creatinine normal   -Bone marrow bx deferred  -On observation   TREATMENT REGIMEN:  Observation   PERTINENT NON-HEM/ONC PROBLEMS: 1. Poorly controlled Type II DM with peripheral neuropathy (A1c ~mid-9's)  ASSESSMENT & PLAN:   IgA kappa MGUS  -Hgb, Cr and Ca levels normal -MM panel pending today  -Bone marrow biopsy deferred as it would unlikely change management -Patient reports that she has significant financial difficulty paying for all of her subspecialist visits -I informed the patient that she can have her q50month CBC/CMP monitoring done by her PCP -In the absence of progressive anemia, worsening renal function or unexplained hypercalcemia, we will monitor her MM labs [id number] reduce her financial burden  Fibromyalgia -Patient reports diffuse, chronic pain, including skin sensitivity -[facility] tizanidine and Cymbalta -Continue follow-up with pain management   Orders Placed This Encounter  Procedures  . CBC with Differential (Cancer Center Only)    Standing Status:   Future    Standing Expiration Date:   12/31/2019  . CMP ([facility])    Standing Status:   Future    Standing Expiration Date:   12/31/2019  . Multiple Myeloma Panel (SPEP&IFE w/QIG)    Standing Status:    Future    Standing Expiration Date:   12/31/2019  . Kappa/lambda light chains    Standing Status:   Future    Standing Expiration Date:   05/28/2020   All questions were answered. The patient knows to call the clinic with any problems, questions or concerns. No barriers to learning was detected.  A total of more than 15 minutes were spent face-to-face with the patient during this encounter and over half of that time was spent on counseling and coordination of care as outlined above.   Return in 1 year for labs and clinic follow-up.  YaTish MenMD 11/26/2018 3:07 PM  CHIEF COMPLAINT: "I am just still hurting all over"  INTERVAL HISTORY: Nichole Garza to clinic for follow-up of IgA kappa MGUS.  Patient reports that she still has chronic, generalized body ache and skin hypersensitivity, for which she is taking gabapentin, Cymbalta, tizanidine, [facility] also reports intermittent radiculopathy involving the upper extremities, for which she has been trying to do some hand exercises without significant relief.  She was recently diagnosed with carpal tunnel syndrome in her right wrist.  She endorses chronic sweating during the day and at night, but denies any fever, lymphadenopathy, or unexplained weight loss.  She denies any other complaint today.  REVIEW OF SYSTEMS:   Constitutional: ( - ) fevers, ( - )  chills , ( + due to her mood.  Clearly deteriorating. GU) generalized sweats Eyes: ( - ) blurriness of vision, ( - ) double vision, ( - ) watery eyes Ears,  nose, mouth, throat, and face: ( - ) mucositis, ( - ) sore throat Respiratory: ( - ) cough, ( - ) dyspnea, ( - ) wheezes Cardiovascular: ( - ) palpitation, ( - ) chest discomfort, ( - ) lower extremity swelling Gastrointestinal:  ( - ) nausea, ( - ) heartburn, ( - ) change in bowel habits Skin: ( - ) abnormal skin rashes Lymphatics: ( - ) new lymphadenopathy, ( - ) easy  bruising Behavioral/Psych: ( - ) mood change, ( - ) new changes  All other systems were reviewed with the patient and are negative.  I have reviewed the past medical history, past surgical history, social history and family history with the patient and they are unchanged from previous note.  ALLERGIES:  is allergic to enalapril; hydrocodone; latex; and relpax [eletriptan].  MEDICATIONS:  Current Outpatient Medications  Medication Sig Dispense Refill  . amLODipine (NORVASC) 5 MG tablet Take 5 mg by mouth daily.    Marland Kitchen aspirin EC 81 MG tablet Take 81 mg by mouth every morning.    . Black Cohosh 20 MG TABS Take by mouth.    . DULoxetine (CYMBALTA) 30 MG capsule Take 60 mg by mouth daily.     . fish oil-omega-3 fatty acids 1000 MG capsule Take 1 g by mouth 3 (three) times daily.     Marland Kitchen gabapentin (NEURONTIN) 800 MG tablet Take 800 mg by mouth 3 (three) times daily.    Marland Kitchen GINKGO BILOBA COMPLEX PO Take 1 capsule by mouth 2 (two) times a day.     Marland Kitchen glimepiride (AMARYL) 4 MG tablet Take 1 tablet by mouth 2 (two) times daily.  1  . lubiprostone (AMITIZA) 8 MCG capsule Take 1 capsule (8 mcg total) by mouth 2 (two) times daily with a meal. 60 capsule 1  . metFORMIN (GLUCOPHAGE) 500 MG tablet Take 750 mg by mouth 2 (two) times daily with a meal.     . Misc Natural Products (COLON CLEANSE) CAPS Take 1 capsule by mouth 2 (two) times daily.    . Multiple Vitamin (MULTIVITAMIN WITH MINERALS) TABS Take 1 tablet by mouth daily.    . pravastatin (PRAVACHOL) 10 MG tablet Take 10 mg by mouth at bedtime.    Marland Kitchen tiZANidine (ZANAFLEX) 4 MG capsule Take 4 mg by mouth daily. Take 2 tablets once daily    . vitamin C (ASCORBIC ACID) 500 MG tablet Take 500 mg by mouth daily.     No current facility-administered medications for this visit.     PHYSICAL EXAMINATION: ECOG PERFORMANCE STATUS: 2 - Symptomatic, <50% confined to bed  Today's Vitals   11/26/18 1444  BP: (!) 108/93  Pulse: 97  Resp: 18  Temp: (!) 97.1 F  (36.2 C)  TempSrc: Temporal  SpO2: 100%  Weight: 113 lb 6.4 oz (51.4 kg)  Height: 5' (1.524 m)  PainSc: 0-No pain   Body mass index is 22.15 kg/m.  Filed Weights   11/26/18 1444  Weight: 113 lb 6.4 oz (51.4 kg)    GENERAL: alert, no distress, thin SKIN: skin color, texture, turgor are normal, no rashes or significant lesions EYES: conjunctiva are pink and non-injected, sclera clear OROPHARYNX: no exudate, no erythema; lips, buccal mucosa, and tongue normal  NECK: supple, non-tende LUNGS: clear to auscultation with normal breathing effort HEART: regular rate & rhythm and no murmurs and no lower extremity edema ABDOMEN: soft, non-tender, non-distended, normal bowel sounds Musculoskeletal: no cyanosis of digits and no clubbing  PSYCH: alert &  oriented, slowed speech  LABORATORY DATA:  I have reviewed the data as listed    Component Value Date/Time   NA 138 11/26/2018 1421   K 3.8 11/26/2018 1421   CL 99 11/26/2018 1421   CO2 28 11/26/2018 1421   GLUCOSE 278 (H) 11/26/2018 1421   BUN 17 11/26/2018 1421   CREATININE 1.14 (H) 11/26/2018 1421   CALCIUM 9.1 11/26/2018 1421   PROT 7.8 11/26/2018 1421   ALBUMIN 4.6 11/26/2018 1421   AST 14 (L) 11/26/2018 1421   ALT 13 11/26/2018 1421   ALKPHOS 110 11/26/2018 1421   BILITOT 0.2 (L) 11/26/2018 1421   GFRNONAA 53 (L) 11/26/2018 1421   GFRAA >60 11/26/2018 1421    No results found for: SPEP, UPEP  Lab Results  Component Value Date   WBC 6.1 11/26/2018   NEUTROABS 2.8 11/26/2018   HGB 12.5 11/26/2018   HCT 39.5 11/26/2018   MCV 88.2 11/26/2018   PLT 314 11/26/2018      Chemistry      Component Value Date/Time   NA 138 11/26/2018 1421   K 3.8 11/26/2018 1421   CL 99 11/26/2018 1421   CO2 28 11/26/2018 1421   BUN 17 11/26/2018 1421   CREATININE 1.14 (H) 11/26/2018 1421      Component Value Date/Time   CALCIUM 9.1 11/26/2018 1421   ALKPHOS 110 11/26/2018 1421   AST 14 (L) 11/26/2018 1421   ALT 13 11/26/2018  1421   BILITOT 0.2 (L) 11/26/2018 1421

## 2018-11-27 LAB — MULTIPLE MYELOMA PANEL, SERUM
Albumin SerPl Elph-Mcnc: 4.2 g/dL (ref 2.9–4.4)
Albumin/Glob SerPl: 1.3 (ref 0.7–1.7)
Alpha 1: 0.2 g/dL (ref 0.0–0.4)
Alpha2 Glob SerPl Elph-Mcnc: 1 g/dL (ref 0.4–1.0)
B-Globulin SerPl Elph-Mcnc: 1.3 g/dL (ref 0.7–1.3)
Gamma Glob SerPl Elph-Mcnc: 0.9 g/dL (ref 0.4–1.8)
Globulin, Total: 3.3 g/dL (ref 2.2–3.9)
IgA: 677 mg/dL — ABNORMAL HIGH (ref 87–352)
IgG (Immunoglobin G), Serum: 965 mg/dL (ref 586–1602)
IgM (Immunoglobulin M), Srm: 43 mg/dL (ref 26–217)
M Protein SerPl Elph-Mcnc: 0.5 g/dL — ABNORMAL HIGH
Total Protein ELP: 7.5 g/dL (ref 6.0–8.5)

## 2018-11-27 LAB — KAPPA/LAMBDA LIGHT CHAINS
Kappa free light chain: 28.3 mg/L — ABNORMAL HIGH (ref 3.3–19.4)
Kappa, lambda light chain ratio: 1.78 — ABNORMAL HIGH (ref 0.26–1.65)
Lambda free light chains: 15.9 mg/L (ref 5.7–26.3)

## 2018-11-30 ENCOUNTER — Telehealth: Payer: Self-pay | Admitting: *Deleted

## 2018-11-30 NOTE — Telephone Encounter (Signed)
-----   Message from Tish Men, MD sent at 11/27/2018  3:44 PM EDT ----- Delrae Sawyers,  Can you let Ms. Jeanene Erb know that his M-spike is the same?Thanks.  [place]  ----- Message ----- From: Buel Ream, Lab In [place] Sent: 11/26/2018   2:35 PM EDT To: Tish Men, MD

## 2018-11-30 NOTE — Telephone Encounter (Signed)
As noted below by Dr. Maylon Peppers, I informed the patient that the M-spike is the same. She verbalized understanding.

## 2018-12-01 ENCOUNTER — Ambulatory Visit: Payer: Medicare Other | Admitting: *Deleted

## 2018-12-02 ENCOUNTER — Other Ambulatory Visit: Payer: Self-pay | Admitting: *Deleted

## 2018-12-02 NOTE — Patient Outreach (Signed)
[facility]) Care Management  12/02/2018  Nichole Garza Jan 03, 1961 [id number]   CSW was able to make brief contact with patient today to follow-up regarding social work services and resources.  Patient indicated that she was "walking out the door" at the time of CSW's call.  Patient stated that she would return CSW's call at her earliest convenience, reciting CSW's contact information to ensure accuracy.  CSW will make arrangements to contact patient on Thursday, December 10, 2018, around 9:00AM, if a return call is not received from patient in the meantime.  [name], BSW, MSW, LCSW  Licensed Education officer, environmental Health System  Mailing [place] [address] [place], [place] [address] Physical Address-[address], [place], [place] [address] Toll Free Main # [phone] Fax # [phone] Cell # [phone]  Office # [phone] [name]

## 2018-12-08 ENCOUNTER — Other Ambulatory Visit: Payer: Self-pay

## 2018-12-08 MED ORDER — LUBIPROSTONE 8 MCG PO CAPS: 8.0000 ug | ORAL_CAPSULE | Freq: Two times a day (BID) | ORAL | 0 refills | Status: DC

## 2018-12-08 NOTE — Telephone Encounter (Signed)
Amitiza refilled, patient up to date on office visits.

## 2018-12-10 ENCOUNTER — Other Ambulatory Visit: Payer: Self-pay | Admitting: *Deleted

## 2018-12-10 NOTE — Patient Outreach (Signed)
[facility]) Care Management  12/10/2018  Nichole Garza [birth year] [id number]   CSW made an attempt to try and contact patient today to follow-up regarding social work services and resources, as well as to provide counseling and supportive services for symptoms of anxiety and depression; however, patient was not available at the time of CSW's call. CSW left a HIPAA compliant message on voicemail and is currently awaiting a return call.  CSW will make a second outreach attempt within the next 3-4 business days, if a return call is not received from patient in the meantime.  [name], BSW, MSW, LCSW  Licensed Education officer, environmental Health System  Mailing [place] [address], [place], [place] [address] Physical Address-[address], [place], [place] [address] Toll Free Main # [phone] Fax # [phone] Cell # [phone]  Office # [phone] [name]

## 2018-12-15 ENCOUNTER — Other Ambulatory Visit (INDEPENDENT_AMBULATORY_CARE_PROVIDER_SITE_OTHER): Payer: Medicare Other

## 2018-12-15 ENCOUNTER — Encounter: Payer: Self-pay | Admitting: Internal Medicine

## 2018-12-15 ENCOUNTER — Ambulatory Visit (INDEPENDENT_AMBULATORY_CARE_PROVIDER_SITE_OTHER): Payer: Medicare Other | Admitting: Internal Medicine

## 2018-12-15 ENCOUNTER — Other Ambulatory Visit: Payer: Self-pay

## 2018-12-15 VITALS — BP 152/98 | HR 100 | Temp 98.6°F | Ht 60.0 in | Wt 116.0 lb

## 2018-12-15 DIAGNOSIS — N816 Rectocele: Secondary | ICD-10-CM

## 2018-12-15 DIAGNOSIS — R140 Abdominal distension (gaseous): Secondary | ICD-10-CM

## 2018-12-15 DIAGNOSIS — R4589 Other symptoms and signs involving emotional state: Secondary | ICD-10-CM

## 2018-12-15 DIAGNOSIS — K5909 Other constipation: Secondary | ICD-10-CM | POA: Diagnosis not present

## 2018-12-15 DIAGNOSIS — K439 Ventral hernia without obstruction or gangrene: Secondary | ICD-10-CM | POA: Diagnosis not present

## 2018-12-15 DIAGNOSIS — F418 Other specified anxiety disorders: Secondary | ICD-10-CM

## 2018-12-15 LAB — CREATININE, SERUM: Creatinine, Ser: 1.05 mg/dL (ref 0.40–1.20)

## 2018-12-15 LAB — BUN: BUN: 13 mg/dL (ref 6–23)

## 2018-12-15 NOTE — Progress Notes (Signed)
Nichole Garza 58 y.o. 07-14-1960 Nichole Garza  Assessment & Plan:   Encounter Diagnoses  Name Primary?  . Ventral hernia without obstruction or gangrene ? Yes  . Abdominal distention   . Bloating   . Chronic constipation   . Rectocele   . Anxiety about health     Her abdominal distention is fairly impressive, it could be all due to a lax abdominal wall.  I wonder if she has a hernia.  I am going to go ahead and CT her.  Further plans after that.  She could need MR defecography to evaluate pelvic floor and rectocele..  We will try Linzess 145 mcg daily and discontinue Amitiza.  Reassured as best I could today.  Nichole Garza, Nichole Beard, MD   Subjective:   Chief Complaint: Bloating abdominal discomfort  HPI Nichole Garza is here for follow-up still complaining of feeling bloated.  Like she is pregnant.  She is tried Amitiza at 8 mcg and has not really helped much.  She still taking an enema every 3 days.  Also using over-the-counter products.  Does detox with apple cider vinegar and other supplements 2.  Feels bloated and pregnant.  Defecation does not improve things.  Sore.  No severe pain.  There is a bulge in her abdomen near 1 of her surgical scars and she wonders what it is.  She is very upset because she wants to visit her elderly parents in [place] and the travel restrictions make that hard, even though they are in place she and her husband and her brother and some of her nephews plan to go up in a few days.  She is not losing weight.  She is sleeping okay.  She relates how fibromyalgia came on after she had her hysterectomy.  She is worried about her MGUS, and wonders about having organs removed so she does not get cancer, this comes up when I asked her about her ovaries.  They apparently remain.  Wt Readings from Last 3 Encounters:  12/15/18 116 lb (52.6 kg)  11/26/18 113 lb 6.4 oz (51.4 kg)  11/10/18 117 lb (53.1 kg)   She was originally seen through telehealth.  Then  she had her colonoscopy in August which showed a diminutive sessile serrated polyp and some melanosis otherwise negative.  She does not have any genitourinary complaints i.e. no urinary urgency leakage etc. Allergies  Allergen Reactions  . Enalapril Anaphylaxis  . Hydrocodone Other (See Comments)    Does not remember what happened-she woke up in her kitchen  . Latex Swelling    Lips and face  . Relpax [Eletriptan] Other (See Comments)    "fainting"-knocked her out for the whole day   Current Meds  Medication Sig  . amLODipine (NORVASC) 5 MG tablet Take 5 mg by mouth daily.  Marland Kitchen aspirin EC 81 MG tablet Take 81 mg by mouth every morning.  . Black Cohosh 20 MG TABS Take by mouth.  . DULoxetine (CYMBALTA) 30 MG capsule Take 60 mg by mouth daily.   . fish oil-omega-3 fatty acids 1000 MG capsule Take 1 g by mouth 3 (three) times daily.   Marland Kitchen gabapentin (NEURONTIN) 800 MG tablet Take 800 mg by mouth 3 (three) times daily.  Marland Kitchen GINKGO BILOBA COMPLEX PO Take 1 capsule by mouth 2 (two) times a day.   Marland Kitchen glimepiride (AMARYL) 4 MG tablet Take 1 tablet by mouth 2 (two) times daily.  Marland Kitchen linaclotide (LINZESS) 145 MCG CAPS capsule Take 145 mcg by mouth daily  before breakfast.  . metFORMIN (GLUCOPHAGE) 500 MG tablet Take 750 mg by mouth 2 (two) times daily with a meal.   . Misc Natural Products (COLON CLEANSE) CAPS Take 1 capsule by mouth 2 (two) times daily.  . Multiple Vitamin (MULTIVITAMIN WITH MINERALS) TABS Take 1 tablet by mouth daily.  . pravastatin (PRAVACHOL) 10 MG tablet Take 10 mg by mouth at bedtime.  Marland Kitchen tiZANidine (ZANAFLEX) 4 MG capsule Take 4 mg by mouth daily. Take 2 tablets once daily  . vitamin C (ASCORBIC ACID) 500 MG tablet Take 500 mg by mouth daily.  . [DISCONTINUED] lubiprostone (AMITIZA) 8 MCG capsule Take 1 capsule (8 mcg total) by mouth 2 (two) times daily with a meal.   Past Medical History:  Diagnosis Date  . Arthritis    in her spine  . Diabetes mellitus   . Fibromyalgia   .  Hx of colonic polyp - ssp 11/15/2018   10/2018 diminutive sessile serrated polyp recall 2027 Nichole Mayer, MD, [facility]   . Hypertension   . Kidney stone    kidney stone  . Migraine headache   . Nerve pain   . Rectocele 11/10/2018  . TIA (transient ischemic attack)    Past Surgical History:  Procedure Laterality Date  . ABDOMINAL HYSTERECTOMY  2012  . CYSTOSCOPY WITH RETROGRADE PYELOGRAM, URETEROSCOPY AND STENT PLACEMENT Right 05/29/2012   Procedure: CYSTOSCOPY WITH RETROGRADE PYELOGRAM, URETEROSCOPY AND STENT PLACEMENT;  Surgeon: Hanley Ben, MD;  Location: [facility];  Service: Urology;  Laterality: Right;  . HOLMIUM LASER APPLICATION Right [id number]   Procedure: HOLMIUM LASER APPLICATION;  Surgeon: Hanley Ben, MD;  Location: [facility];  Service: Urology;  Laterality: Right;   Social History   Social History Narrative   Married.  Lives with husband.     Disabled CNA    grown daughter + stepdaughter/stepson   No EtOH, no smoking/tobacco/drugs   family history includes Cancer in her brother; Diabetes in her father and mother.   Review of Systems As above  Objective:   Physical Exam BP (!) 152/98 (BP Location: Left Arm, Patient Position: Sitting, Cuff Size: Normal)   Pulse 100   Temp 98.6 F (37 C) (Oral)   Ht 5' (1.524 m)   Wt 116 lb (52.6 kg)   BMI 22.65 kg/m  Pleasant petite but well-developed well-nourished appearing black woman in no acute distress Eyes are anicteric The abdomen is protuberant and there is a small laparoscopic car in between the umbilicus and xiphoid and next to that on the right there is a small bulge with abdominal wall tension, it could be a hernia it is probably a diastases recti but I am not sure.  It is slightly sore to the touch. Skin is free of rash Mood notable for some anxiety, she is quite upset about going to see her elderly parents whom she has not seen in months and wants to get to see them.

## 2018-12-15 NOTE — Patient Instructions (Addendum)
Your provider has requested that you go to the basement level for lab work before leaving today. Press "B" on the elevator. The lab is located at the first door on the left as you exit the elevator.  Stop your Amitiza and start the Linzess one a day before the first meal of the day.  We are providing you with Linzess samples.   You have been scheduled for a CT scan of the abdomen and pelvis at [facility] ([address]---this is in the same building as Press [facility]).   You are scheduled on _9/_29/2020___________ at ______3:45pm____________. You should arrive 15 minutes prior to your appointment time for registration. Please follow the written instructions below on the day of your exam:  WARNING: IF YOU ARE ALLERGIC TO IODINE/X-RAY DYE, PLEASE NOTIFY RADIOLOGY IMMEDIATELY AT [phone]! YOU WILL BE GIVEN A 13 HOUR PREMEDICATION PREP.  1) Do not eat or drink anything after ______11:45AM____________ (4 hours prior to your test) 2) You have been given 2 bottles of oral contrast to drink. The solution may taste better if refrigerated, but do NOT add ice or any other liquid to this solution. Shake well before drinking.    Drink 1 bottle of contrast @ ___1:45pm__________ (2 hours prior to your exam)  Drink 1 bottle of contrast @ ____2:45pm_________ (1 hour prior to your exam)  You may take any medications as prescribed with a small amount of water, if necessary. If you take any of the following medications: METFORMIN, GLUCOPHAGE, GLUCOVANCE, AVANDAMET, RIOMET, FORTAMET, [place] MET, JANUMET, GLUMETZA or METAGLIP, you MAY be asked to HOLD this medication 48 hours AFTER the exam.  The purpose of you drinking the oral contrast is to aid in the visualization of your intestinal tract. The contrast solution may cause some diarrhea. Depending on your individual set of symptoms, you may also receive an intravenous injection of x-ray contrast/dye. Plan on being at [facility] for  30 minutes or longer, depending on the type of exam you are having performed.  This test typically takes 30-45 minutes to complete.  If you have any questions regarding your exam or if you need to reschedule, you may call the CT department at [phone] between the hours of 8:00 am and 5:00 pm, Monday-Friday.  ________________________________________________________________________  I appreciate the opportunity to care for you. Silvano Rusk, MD, [facility]

## 2018-12-16 ENCOUNTER — Other Ambulatory Visit: Payer: Self-pay | Admitting: *Deleted

## 2018-12-16 NOTE — Patient Outreach (Signed)
[facility]) Care Management  12/16/2018  Nichole Garza Mar 25, 1961 [id number]  CSW was able to make contact with patient today to follow-up regarding social work services and resources, as well as to provide counseling and supportive services for symptoms of anxiety and depression.  Patient admitted that she was experiencing a great deal of pain today, associated with her Fibromyalgia, and that she has been unable to hold on to anything with her right hand because it is numb.  Patient stated, "This is a typical day for me, some days are just worse than others, depending on the weather".  Patient denied receiving any pain medications, reporting that she does not wish to get addicted to anything, nor does she want to have to rely on pain medications for everyday life.  Patient indicated that she saw her Gastroenterologist, Dr. Silvano Rusk at [facility], yesterday (Tuesday, December 15, 2018 at 11:00) and that Dr. Carlean Purl was able to review the results of her recent colonoscopy.  Patient took away from the visit that she has polyps, not cancerous at present, but appear to be precancerous and need to be removed.    Patient went on to say that Dr. Carlean Purl ordered lab work and wants for her to receive a CT Scan of her abdomen and pelvis to try and find out why her abdomen is so distended and why she is experiencing so much abdominal discomfort.  The CT Scan of her abdomen and pelvis is scheduled for Tuesday, December 29, 2018 at 3:45PM, at [facility] in [place].  Patient admitted that she is having to wear maternity clothes, as if she were pregnant, because her abdomen is so swollen.  Patient has tried eating different foods, but indicated that nothing is working.      Patient reported that Dr. Carlean Purl explained to her that it could be due to a lax abdominal wall or possibly a hernia.  Dr. Carlean Purl prescribed Linzess 145 mcg, daily, requesting that patient  discontinue Amitiza 8 mcg, daily.  Patient reported that she is still taking an enema every 3 days and detox's with apple cider vinegar daily.  Patient also admits to taking over-the-counter products and supplements to try and relieve some of her symptoms.  Patient verbalized that she is "so incredibly frustrated", just wanting to find out what is wrong with her.    Patient was happy to report that her brother recently moved to [place], from [place], to be closer to her and to offer assistance to her with activities of daily living, for days when she does not feel like even getting out of bed.  Patient also reported that her husband calls her from work, "every time he gets a break" to check in on her, and that he recently installed a camera in their home so that he can monitor her.  Last, patient indicated that two of her lady friends come by several times a week to check in on her, as well as get her out of the house.  On a positive note, patient stated that she is now saving $380.00 a month on her prescription medications by using a mail in agency called [employer], through [employer].  Patient then needed to terminate the call, indicating that she was going to meet a friend at the shopping center to walk around and try and take her mind off of things for a while.  Patient admitted that she tries to stay busy because the more she sits around,  the more she hurts, and the more she has time to dwell on everything that is wrong with her health.    CSW agreed to contact patient again next week, on Thursday, December 24, 2018, around 9:00AM, to assess her situation, as well as provide counseling and supportive services.  Patient voiced understanding and was agreeable to this plan.  Patient admitted that she is so appreciative of CSW's calls, and that it is nice to have someone to talk to about everything that she is going through, not wanting to worry or burden her family members and friends.   Patient has CSW's contact information and has been encouraged to contact CSW at any time.  [name], BSW, MSW, LCSW  Licensed Education officer, environmental Health System  Mailing [place] [address] [place], [place] [address] Physical Address-[address], [place], [place] [address] Toll Free Main # [phone] Fax # [phone] Cell # [phone]  Office # [phone] [name]

## 2018-12-17 DIAGNOSIS — M797 Fibromyalgia: Secondary | ICD-10-CM | POA: Diagnosis not present

## 2018-12-17 DIAGNOSIS — I10 Essential (primary) hypertension: Secondary | ICD-10-CM | POA: Diagnosis not present

## 2018-12-17 DIAGNOSIS — G43909 Migraine, unspecified, not intractable, without status migrainosus: Secondary | ICD-10-CM | POA: Diagnosis not present

## 2018-12-17 DIAGNOSIS — Z0000 Encounter for general adult medical examination without abnormal findings: Secondary | ICD-10-CM | POA: Diagnosis not present

## 2018-12-17 DIAGNOSIS — E119 Type 2 diabetes mellitus without complications: Secondary | ICD-10-CM | POA: Diagnosis not present

## 2018-12-24 ENCOUNTER — Other Ambulatory Visit: Payer: Self-pay | Admitting: *Deleted

## 2018-12-24 NOTE — Patient Outreach (Signed)
[facility]) Care Management  12/24/2018  Nichole Garza [birth year] [id number]   CSW made an attempt to try and contact patient today to follow-up regarding social work services and resources; however, patient was unavailable at the time of CSW's call.  CSW left a HIPAA compliant message for patient on voicemail and is currently waiting a return call.  CSW will make a second outreach attempt within the next 3-4 business days, if a return call is not received from patient in the meantime.  [name], BSW, MSW, LCSW  Licensed Education officer, environmental Health System  Mailing [place] [address] [place], [place] [address] Physical Address-[address], [place], [place] [address] Toll Free Main # [phone] Fax # [phone] Cell # [phone]  Office # [phone] [name]

## 2018-12-25 ENCOUNTER — Other Ambulatory Visit: Payer: Self-pay | Admitting: *Deleted

## 2018-12-25 NOTE — Patient Outreach (Signed)
[name]) Care Management  12/25/2018  Nichole Garza 02-Sep-1960 [id number]   CSW received a voicemail message from patient last evening, after hours, indicating that she received CSW's HIPAA compliant message left on her voicemail yesterday (Thursday, December 24, 2018).  Patient went on to explain that she was not able to talk at the time of CSW's call because she was attending her mother's funeral in [place].  Patient encouraged CSW to try to call her back in one week, reporting that she should be home by the end of next week.  CSW will make arrangements to contact patient on Thursday, December 31, 2018, around 9:00AM.  CSW will offer condolences, as well as provide counseling and supportive services for symptoms of anxiety and depression.  [name], BSW, MSW, LCSW  Licensed Education officer, environmental Health System  Mailing [place] [address] [place], [place] [address] Physical Address-[address], [place], [place] [address] Toll Free Main # [phone] Fax # [phone] Cell # [phone]  Office # [phone] [name]

## 2018-12-29 ENCOUNTER — Ambulatory Visit: Payer: Self-pay | Admitting: *Deleted

## 2018-12-29 ENCOUNTER — Other Ambulatory Visit: Payer: Self-pay

## 2018-12-29 ENCOUNTER — Ambulatory Visit (INDEPENDENT_AMBULATORY_CARE_PROVIDER_SITE_OTHER)
Admission: RE | Admit: 2018-12-29 | Discharge: 2018-12-29 | Disposition: A | Discharge status: Home / Self Care | Payer: Medicare Other | Source: Ambulatory Visit | Attending: Internal Medicine | Admitting: Internal Medicine

## 2018-12-29 DIAGNOSIS — R8290 Unspecified abnormal findings in urine: Secondary | ICD-10-CM

## 2018-12-29 DIAGNOSIS — K439 Ventral hernia without obstruction or gangrene: Secondary | ICD-10-CM | POA: Diagnosis not present

## 2018-12-29 DIAGNOSIS — R109 Unspecified abdominal pain: Secondary | ICD-10-CM | POA: Diagnosis not present

## 2018-12-29 DIAGNOSIS — R140 Abdominal distension (gaseous): Secondary | ICD-10-CM

## 2018-12-29 MED ORDER — CIPROFLOXACIN HCL 750 MG PO TABS: 750.0000 mg | ORAL_TABLET | Freq: Two times a day (BID) | ORAL | 0 refills | Status: DC

## 2018-12-29 MED ORDER — IOHEXOL 300 MG/ML  SOLN
100.0000 mL | Freq: Once | INTRAMUSCULAR | Status: AC | PRN
  Administered 2018-12-29: 100 mL via INTRAVENOUS

## 2018-12-29 NOTE — Progress Notes (Signed)
Please call the patient and tell her looks like there is a kidney infection.  I am not sure that that is causing all of her problems.  I need her to do a urinalysis with culture.  Use pyelonephritis for diagnosis.  She should do the urinalysis and culture first thing in the morning and then start Cipro 750 mg twice daily for 7 days.  Please refer her to urology also so they can evaluate this and see if anything else needs to be done.  Ask her if the Linzess is working for her bloating and constipation and let me know and I will tell her what is next for that depending upon the answer

## 2018-12-30 ENCOUNTER — Other Ambulatory Visit (INDEPENDENT_AMBULATORY_CARE_PROVIDER_SITE_OTHER): Payer: Medicare Other

## 2018-12-30 ENCOUNTER — Other Ambulatory Visit: Payer: Medicare Other

## 2018-12-30 DIAGNOSIS — R8290 Unspecified abnormal findings in urine: Secondary | ICD-10-CM

## 2018-12-30 LAB — URINALYSIS, MICROSCOPIC ONLY

## 2018-12-31 ENCOUNTER — Other Ambulatory Visit: Payer: Self-pay

## 2018-12-31 ENCOUNTER — Other Ambulatory Visit: Payer: Self-pay | Admitting: *Deleted

## 2018-12-31 MED ORDER — CIPROFLOXACIN HCL 750 MG PO TABS: 750.0000 mg | ORAL_TABLET | Freq: Two times a day (BID) | ORAL | 0 refills | Status: AC

## 2018-12-31 NOTE — Patient Outreach (Signed)
[facility]) Care Management  12/31/2018  Lakea Shaw-Falconer October 15, 1960 [id number]   CSW was able to make brief contact with patient today, to follow-up regarding social work services and resources, as well as to try and provide counseling and supportive services for symptoms of anxiety and depression.  Patient answered the phone, sounding as though she had been sleeping, indicating that she was not feeling well today and really not in the mood to talk on the phone.  CSW voiced understanding, offering condolences for patient's mothers' recent death, explaining to patient that Richey is available if patient changes her mind and decides that she would like to talk.  Patient was most appreciative, admitting that she is still actively grieving, just wanting to be alone with her thoughts and memories.    Patient reported that her and her siblings are trying to decide on a time to fly back to [place] to gather her mother's remains.  They will then travel to [place] to bury her mother, where they will provide her mother with a proper burial service.  CSW explained to patient that CSW will be on vacation next week, but that one of CSW's colleagues will be covering and available, in the event that patient is in need of social work services and/or resources.  Otherwise, CSW agreed to follow-up with patient again in two weeks, on Thursday, January 14, 2019, around 9:00AM.  Patient voiced understanding, verbalizing that she will be okay, admitting that she has a great support system through family members and friends.  [name], BSW, MSW, LCSW  Licensed Education officer, environmental Health System  Mailing [place] [address] [place], [place] [address] Physical Address-[address], [place], [place] [address] Toll Free Main # [phone] Fax # [phone] Cell # [phone]  Office #  [phone] [name]

## 2018-12-31 NOTE — Progress Notes (Signed)
Her UA showed signs of infection Culture is pending  Please refill the Cipro I meant to treat x 14 days  She needs a referral to urology - do not see that it was done  thanks

## 2019-01-01 LAB — URINE CULTURE
MICRO NUMBER:: 939130
SPECIMEN QUALITY:: ADEQUATE

## 2019-01-04 ENCOUNTER — Telehealth: Payer: Self-pay | Admitting: Internal Medicine

## 2019-01-04 NOTE — Telephone Encounter (Signed)
Patient was looking at the pharmacy papers on her Cipro and it said to make sure to let her Dr know if she was on zanaflex or duloxetine which she is on both. She has started her Cipro but wants to make sure this is okay Sir.

## 2019-01-04 NOTE — Telephone Encounter (Signed)
Left her a detailed message to call us back so we can get her questions answered.

## 2019-01-05 MED ORDER — LINACLOTIDE 290 MCG PO CAPS: 290.0000 ug | ORAL_CAPSULE | Freq: Every day | ORAL | 0 refills | Status: DC

## 2019-01-05 NOTE — Telephone Encounter (Signed)
Those are possible changes in the levels of those drugs but I have treated many patients with cipro on these drugs and no problems.  Should be fine for the short time on the cipro but if she feels different or has any other questions call back

## 2019-01-05 NOTE — Progress Notes (Signed)
Please get her on for a next available Nov f/u also

## 2019-01-05 NOTE — Telephone Encounter (Signed)
Patient informed. She said her abdominal area is still large, no trouble breathing. She is requesting that we send in a higher dose of the Linzess to try. She tried samples of the 125mcg.  She said please send to [facility]. We can send in less than 90 days per patient.

## 2019-01-05 NOTE — Telephone Encounter (Signed)
She has no more of the Linzess 145 mcg so I have sent in the 290 mcg to [facility] as requested. She also made a f/u appointment.

## 2019-01-05 NOTE — Telephone Encounter (Signed)
Have her take 2 at a time first if she still has some  If not can Rx it

## 2019-01-05 NOTE — Progress Notes (Signed)
Left her a message to call me back so we can set her up an appointment.

## 2019-01-14 ENCOUNTER — Other Ambulatory Visit: Payer: Self-pay | Admitting: *Deleted

## 2019-01-14 NOTE — Patient Outreach (Signed)
[facility]) Care Management  01/14/2019  Valmai Shaw-Falconer 06-04-1960 [id number]   [place]  Referral Date:11/17/2017 Referral Source:Health Risk Assessment Screening Reason for Referral:Disease Management Education Insurance:[employer] Medicare   Outreach Attempt:  Outreach attempt #1 to patient for follow up.  Patient answered and stated she was not up to speaking at this time due to the recent death of her mother.  Requested a telephone call back in a few weeks.   Plan:  RN Health Coach will make another telephone outreach to patient within the month of October per patient's request.  [name] RN [place] [phone] [email or web address]

## 2019-01-14 NOTE — Patient Outreach (Signed)
[facility]) Care Management  01/14/2019  Nichole Garza Oct 25, 1960 [id number]   CSW made an attempt to try and contact patient today to follow-up regarding social work services and resources, as well as to provide counseling and supportive services for symptoms of depression; however, patient was unavailable at the time of CSW's call.  CSW left a HIPAA compliant message on voicemail and is currently awaiting a return call.  CSW will make a second outreach attempt within the next 3-4 business days, if a return call is not received from patient in the meantime.  [name], BSW, MSW, LCSW  Licensed Education officer, environmental Health System  Mailing [place] [address] [place], [place] [address] Physical Address-[address], [place], [place] [address] Toll Free Main # [phone] Fax # [phone] Cell # [phone]  Office # [phone] [name]

## 2019-01-20 ENCOUNTER — Encounter: Payer: Self-pay | Admitting: Internal Medicine

## 2019-01-20 ENCOUNTER — Ambulatory Visit: Payer: Medicare Other | Admitting: Internal Medicine

## 2019-01-20 ENCOUNTER — Other Ambulatory Visit: Payer: Self-pay | Admitting: *Deleted

## 2019-01-20 VITALS — BP 146/88 | HR 92 | Temp 98.2°F | Ht 60.0 in | Wt 111.8 lb

## 2019-01-20 DIAGNOSIS — R933 Abnormal findings on diagnostic imaging of other parts of digestive tract: Secondary | ICD-10-CM

## 2019-01-20 DIAGNOSIS — F4321 Adjustment disorder with depressed mood: Secondary | ICD-10-CM | POA: Diagnosis not present

## 2019-01-20 DIAGNOSIS — K439 Ventral hernia without obstruction or gangrene: Secondary | ICD-10-CM

## 2019-01-20 DIAGNOSIS — R140 Abdominal distension (gaseous): Secondary | ICD-10-CM

## 2019-01-20 MED ORDER — CLONAZEPAM 0.5 MG PO TABS: 0.5000 mg | ORAL_TABLET | Freq: Every evening | ORAL | 0 refills | Status: DC | PRN

## 2019-01-20 NOTE — Patient Instructions (Addendum)
You have been scheduled for an endoscopy. Please follow written instructions given to you at your visit today. If you use inhalers (even only as needed), please bring them with you on the day of your procedure.   Hold your diabetic medicines the AM of your procedure.    We have sent the following medications to your pharmacy for you to pick up at your convenience: Clonazepam , hopefully this will help you sleep. Patient aware not to take zanaflex and this medicine together.    We are giving you samples of Linzess today to have.   I appreciate the opportunity to care for you. Silvano Rusk, MD, [facility]

## 2019-01-20 NOTE — Patient Outreach (Signed)
[facility]) Care Management  01/20/2019  Nichole Garza 07-18-1960 [id number]   CSW was able to make brief contact with patient today to offer counseling and supportive services.  Patient's brother, who recently moved to [place] from [place], was visiting with patient and they were in the process of discussing funeral arrangements for patient's recently deceased mother.  Patient indicated that the family already had a funeral service for her mother in [place], while she was there visiting; however, they believe that their mother deserves a proper burial in [place], where her mother was born and raised, before her mother will be able to fully rest in peace.   Patient admitted that she is still actively grieving the loss of her mother, "unable to believe that she is really gone from this world".  CSW spoke with patient about Grief and Loss counseling services offered through [employer] (formerly [place]); however, patient did not express an interest.  Patient indicated that she just needs to deal with her mother's death in her own way, surrounded by family members.  CSW voiced understanding, encouraging patient to let CSW know if she changes her mind or if she is interested in counseling resources.  Patient reported that her stomach is still very distended, "looking as though I am carrying around triplets".  Patient went on to say that she has an appointment today (Wednesday, January 20, 2019 at 2:50PM) to follow-up with Dr. Silvano Rusk, Gastroenterologist with [facility], to have more tests run to see if they can determine what is causing the bloating in her stomach and abdomen area.  Patient did recently learn that she had a kidney infection, but is hoping that it has since been resolved, after receiving a 7 day course of Cipro 750MG , PO, twice daily.  Patient encouraged CSW to follow-up with her again  in a few weeks, on Thursday, February 04, 2019, around 9:00AM, to obtain information about today's appointment, as well as assess her mental health status.  As always, CSW reminded patient that CSW is available to her, Monday thru Friday, from 8:30AM until 5:00PM, to offer counseling and supportive services, if patient is ever interested in receiving these services.  Patient was very appreciative of the services offered to her thus far, agreeing to contact CSW directly if social work needs arise in the meantime, or if patient just needs to talk through her grief.  [name], BSW, MSW, LCSW  Licensed Education officer, [facility]  Mailing [place] [address], [place], [place] [address] Physical Address-[address], [place], [place] [address] Toll Free Main # [phone] Fax # [phone] Cell # [phone]  Office # [phone] [name]

## 2019-01-20 NOTE — Progress Notes (Signed)
Nichole Garza 58 y.o. 05-13-1960 [id number]  Assessment & Plan:   Abnormal CT scan, stomach The stomach is dilated and enlarged.  She has an odd looking CT in my opinion, there is a small ventral hernia that is not an issue but I do not know if it is lax abdominal wall or something we are missing on the CT scan but her organs protrude substantially and rather diffusely.  EGD is necessary to evaluate the stomach.  Further plans pending that.The risks and benefits as well as alternatives of endoscopic procedure(s) have been discussed and reviewed. All questions answered. The patient agrees to proceed.   Abdominal distention CT scanning has shown that this is mostly her abdominal organs causing this with a question of a lax abdominal wall or an unappreciated larger hernia?  Await EGD, review CT scans with radiology/surgery  Grief reaction Trade out tizanidine for clonazepam low-dose at bedtime she is fairly overwhelmed by the tragic circumstances of driving to [place] to see her previously healthy mother, and her mother dying before she could get there.  Ventral hernia without obstruction or gangrene This is tiny and not an issue though she notices it because of all the other organs and distention associated as mentioned in the other assessments     Subjective:   Chief Complaint:  HPI Nichole Garza returns for follow-up visit, she has complained of significant abdominal distention and some bowel issues, a colonoscopy in August of this year was unrevealing regarding the abdominal distention.  There was a diminutive polyp.  Subsequent CT scanning results as follows, images reviewed in person with the patient  IMPRESSION: 1. Abnormal appearance of the upper pole of left kidney concerning for focal pyelonephritis or lobar nephronia. No renal abscess. 2. Tiny supraumbilical, midline ventral abdominal wall hernia measures 1.2 cm and contains fat only. 3. These results will be called to  the ordering clinician or representative by the Radiologist Assistant, and communication documented in the PACS or zVision Dashboard.  I had her do a urinalysis and culture and it was positive for Klebsiella pneumonia.  She never really had any urinary symptoms.  The fact that it was intrarenal change in infection may be refer her to urology which is pending and I believe she sees them next week.  Subsequently, she went to [place] despite the Covid restrictions because she had wanted to see her elderly previously well mother, and on the way to [place] unfortunately her mother fell ill quickly and died before Nichole Garza could make it to see her.  This is upset her greatly she is not sleeping well.  She was previously having some difficulty eating and still is and that may have intensified.  She is emotional and distraught about this, understandably. Allergies  Allergen Reactions  . Enalapril Anaphylaxis  . Hydrocodone Other (See Comments)    Does not remember what happened-she woke up in her kitchen  . Latex Swelling    Lips and face  . Relpax [Eletriptan] Other (See Comments)    "fainting"-knocked her out for the whole day   Current Meds  Medication Sig  . amLODipine (NORVASC) 5 MG tablet Take 5 mg by mouth daily.  Marland Kitchen aspirin EC 81 MG tablet Take 81 mg by mouth every morning.  . Black Cohosh 20 MG TABS Take by mouth.  . DULoxetine (CYMBALTA) 30 MG capsule Take 60 mg by mouth daily.   . fish oil-omega-3 fatty acids 1000 MG capsule Take 1 g by mouth 3 (  three) times daily.   Marland Kitchen gabapentin (NEURONTIN) 800 MG tablet Take 800 mg by mouth 3 (three) times daily.  Marland Kitchen GINKGO BILOBA COMPLEX PO Take 1 capsule by mouth 2 (two) times a day.   Marland Kitchen glimepiride (AMARYL) 4 MG tablet Take 1 tablet by mouth 2 (two) times daily.  Marland Kitchen linaclotide (LINZESS) 290 MCG CAPS capsule Take 1 capsule (290 mcg total) by mouth daily before breakfast.  . metFORMIN (GLUCOPHAGE) 500 MG tablet Take 750 mg by mouth 2 (two) times daily  with a meal.   . Misc Natural Products (COLON CLEANSE) CAPS Take 1 capsule by mouth 2 (two) times daily.  . Multiple Vitamin (MULTIVITAMIN WITH MINERALS) TABS Take 1 tablet by mouth daily.  . pravastatin (PRAVACHOL) 10 MG tablet Take 10 mg by mouth at bedtime.  Marland Kitchen tiZANidine (ZANAFLEX) 4 MG capsule Take 4 mg by mouth daily. Take 2 tablets once daily  . vitamin C (ASCORBIC ACID) 500 MG tablet Take 500 mg by mouth daily.   Past Medical History:  Diagnosis Date  . Arthritis    in her spine  . Diabetes mellitus   . Fibromyalgia   . Hx of colonic polyp - ssp 11/15/2018   10/2018 diminutive sessile serrated polyp recall 2027 Gatha Mayer, MD, [facility]   . Hypertension   . Kidney stone    kidney stone  . Migraine headache   . Nerve pain   . Rectocele 11/10/2018  . TIA (transient ischemic attack)    Past Surgical History:  Procedure Laterality Date  . ABDOMINAL HYSTERECTOMY  2012  . COLONOSCOPY  10/2018  . CYSTOSCOPY WITH RETROGRADE PYELOGRAM, URETEROSCOPY AND STENT PLACEMENT Right 05/29/2012   Procedure: CYSTOSCOPY WITH RETROGRADE PYELOGRAM, URETEROSCOPY AND STENT PLACEMENT;  Surgeon: Hanley Ben, MD;  Location: [facility];  Service: Urology;  Laterality: Right;  . HOLMIUM LASER APPLICATION Right [id number]   Procedure: HOLMIUM LASER APPLICATION;  Surgeon: Hanley Ben, MD;  Location: [facility];  Service: Urology;  Laterality: Right;   Social History   Social History Narrative   Married.  Lives with husband.     Disabled CNA    grown daughter + stepdaughter/stepson   No EtOH, no smoking/tobacco/drugs   family history includes Cancer in her brother; Diabetes in her father and mother.   Review of Systems See above  Objective:   Physical Exam BP (!) 146/88   Pulse 92   Temp 98.2 F (36.8 C)   Ht 5' (1.524 m)   Wt 111 lb 12.8 oz (50.7 kg)   BMI 21.83 kg/m   Thin [place] woman in no acute distress but Tearful, upset but able to focus and cooperate with the exam and  history Anicteric abd very distended and tympanitic but nonender Tiny defect in midline scar above umbilicus

## 2019-01-23 DIAGNOSIS — R933 Abnormal findings on diagnostic imaging of other parts of digestive tract: Secondary | ICD-10-CM | POA: Insufficient documentation

## 2019-01-23 DIAGNOSIS — K439 Ventral hernia without obstruction or gangrene: Secondary | ICD-10-CM | POA: Insufficient documentation

## 2019-01-23 DIAGNOSIS — R140 Abdominal distension (gaseous): Secondary | ICD-10-CM | POA: Insufficient documentation

## 2019-01-23 DIAGNOSIS — F4321 Adjustment disorder with depressed mood: Secondary | ICD-10-CM | POA: Insufficient documentation

## 2019-01-23 NOTE — Assessment & Plan Note (Signed)
The stomach is dilated and enlarged.  She has an odd looking CT in my opinion, there is a small ventral hernia that is not an issue but I do not know if it is lax abdominal wall or something we are missing on the CT scan but her organs protrude substantially and rather diffusely.  EGD is necessary to evaluate the stomach.  Further plans pending that.The risks and benefits as well as alternatives of endoscopic procedure(s) have been discussed and reviewed. All questions answered. The patient agrees to proceed.

## 2019-01-23 NOTE — Assessment & Plan Note (Signed)
This is tiny and not an issue though she notices it because of all the other organs and distention associated as mentioned in the other assessments

## 2019-01-23 NOTE — Assessment & Plan Note (Addendum)
Trade out tizanidine for clonazepam low-dose at bedtime she is fairly overwhelmed by the tragic circumstances of driving to [place] to see her previously healthy mother, and her mother dying before she could get there.

## 2019-01-23 NOTE — Assessment & Plan Note (Signed)
CT scanning has shown that this is mostly her abdominal organs causing this with a question of a lax abdominal wall or an unappreciated larger hernia?  Await EGD, review CT scans with radiology/surgery

## 2019-01-25 ENCOUNTER — Telehealth: Payer: Self-pay

## 2019-01-25 NOTE — Telephone Encounter (Signed)
Covid-19 screening questions   Do you now or have you had a fever in the last 14 days?  Do you have any respiratory symptoms of shortness of breath or cough now or in the last 14 days?  Do you have any family members or close contacts with diagnosed or suspected Covid-19 in the past 14 days?  Have you been tested for Covid-19 and found to be positive?       

## 2019-01-26 ENCOUNTER — Other Ambulatory Visit: Payer: Self-pay | Admitting: *Deleted

## 2019-01-26 ENCOUNTER — Encounter: Payer: Self-pay | Admitting: Internal Medicine

## 2019-01-26 ENCOUNTER — Ambulatory Visit (AMBULATORY_SURGERY_CENTER): Payer: Medicare Other | Admitting: Internal Medicine

## 2019-01-26 ENCOUNTER — Other Ambulatory Visit: Payer: Self-pay

## 2019-01-26 VITALS — BP 150/110 | HR 96 | Temp 98.6°F | Resp 11 | Ht 61.0 in | Wt 111.0 lb

## 2019-01-26 DIAGNOSIS — E119 Type 2 diabetes mellitus without complications: Secondary | ICD-10-CM | POA: Diagnosis not present

## 2019-01-26 DIAGNOSIS — I10 Essential (primary) hypertension: Secondary | ICD-10-CM | POA: Diagnosis not present

## 2019-01-26 DIAGNOSIS — R933 Abnormal findings on diagnostic imaging of other parts of digestive tract: Secondary | ICD-10-CM | POA: Diagnosis not present

## 2019-01-26 DIAGNOSIS — E1140 Type 2 diabetes mellitus with diabetic neuropathy, unspecified: Secondary | ICD-10-CM | POA: Diagnosis not present

## 2019-01-26 DIAGNOSIS — G43909 Migraine, unspecified, not intractable, without status migrainosus: Secondary | ICD-10-CM | POA: Diagnosis not present

## 2019-01-26 DIAGNOSIS — M797 Fibromyalgia: Secondary | ICD-10-CM | POA: Diagnosis not present

## 2019-01-26 MED ORDER — SODIUM CHLORIDE 0.9 % IV SOLN: 500.0000 mL | Freq: Once | INTRAVENOUS | Status: DC

## 2019-01-26 NOTE — Patient Instructions (Addendum)
The esophagus, stomach and upper intestine are all ok.  I am not sure why your abdomen is so protuberant - could be very weak muscles in the abdominal wall.  The hernia you have is very small.  I will refer you to a surgeon.  I appreciate the opportunity to care for you. Gatha Mayer, MD, FACG  YOU HAD AN ENDOSCOPIC PROCEDURE TODAY AT [facility]:   Refer to the procedure report that was given to you for any specific questions about what was found during the examination.  If the procedure report does not answer your questions, please call your gastroenterologist to clarify.  If you requested that your care partner not be given the details of your procedure findings, then the procedure report has been included in a sealed envelope for you to review at your convenience later.  YOU SHOULD EXPECT: Some feelings of bloating in the abdomen. Passage of more gas than usual.  Walking can help get rid of the air that was put into your GI tract during the procedure and reduce the bloating. If you had a lower endoscopy (such as a colonoscopy or flexible sigmoidoscopy) you may notice spotting of blood in your stool or on the toilet paper. If you underwent a bowel prep for your procedure, you may not have a normal bowel movement for a few days.  Please Note:  You might notice some irritation and congestion in your nose or some drainage.  This is from the oxygen used during your procedure.  There is no need for concern and it should clear up in a day or so.  SYMPTOMS TO REPORT IMMEDIATELY:   Following lower endoscopy (colonoscopy or flexible sigmoidoscopy):  Excessive amounts of blood in the stool  Significant tenderness or worsening of abdominal pains  Swelling of the abdomen that is new, acute  Fever of 100F or higher  For urgent or emergent issues, a gastroenterologist can be reached at any hour by calling [phone].   DIET:  We do recommend a small meal at first, but then  you may proceed to your regular diet.  Drink plenty of fluids but you should avoid alcoholic beverages for 24 hours.  ACTIVITY:  You should plan to take it easy for the rest of today and you should NOT DRIVE or use heavy machinery until tomorrow (because of the sedation medicines used during the test).    FOLLOW UP: Our staff will call the number listed on your records 48-72 hours following your procedure to check on you and address any questions or concerns that you may have regarding the information given to you following your procedure. If we do not reach you, we will leave a message.  We will attempt to reach you two times.  During this call, we will ask if you have developed any symptoms of COVID 19. If you develop any symptoms (ie: fever, flu-like symptoms, shortness of breath, cough etc.) before then, please call [phone].  If you test positive for Covid 19 in the 2 weeks post procedure, please call and report this information to [place].    If any biopsies were taken you will be contacted by phone or by letter within the next 1-3 weeks.  Please call us at [phone] if you have not heard about the biopsies in 3 weeks.    SIGNATURES/CONFIDENTIALITY: You and/or your care partner have signed paperwork which will be entered into your electronic medical record.  These signatures attest to the  fact that that the information above on your After Visit Summary has been reviewed and is understood.  Full responsibility of the confidentiality of this discharge information lies with you and/or your care-partner.

## 2019-01-26 NOTE — Patient Outreach (Signed)
[facility]) Care Management  01/26/2019  Nichole Garza July 16, 1960 [id number]   [place]  Referral Date:11/17/2017 Referral Source:Health Risk Assessment Screening Reason for Referral:Disease Management Education Insurance:[employer] Medicare   Outreach Attempt:  Outreach attempt #2 to patient for follow up. No answer. RN Health Coach left HIPAA compliant voicemail message along with contact information.  Per chart review patient undergoing endoscopy today to evaluate abdominal bloating.  Plan:  RN Health Coach will make follow up outreach to patient within the month of November.  [place] [phone] [email or web address]

## 2019-01-26 NOTE — Progress Notes (Signed)
LC - Temp CW - VS   

## 2019-01-26 NOTE — Op Note (Signed)
[facility] Patient Name: Nichole Garza Procedure Date: 01/26/2019 4:05 PM MRN: [id number] Endoscopist: Gatha Mayer , MD Age: 58 Referring MD:  Date of Birth: 04-09-1960 Gender: Female Account #: [id number] Procedure:                Upper GI endoscopy Indications:              Abnormal CT of the GI tract Medicines:                Propofol per Anesthesia, Monitored Anesthesia Care Procedure:                Pre-Anesthesia Assessment:                           - Prior to the procedure, a History and Physical                            was performed, and patient medications and                            allergies were reviewed. The patient's tolerance of                            previous anesthesia was also reviewed. The risks                            and benefits of the procedure and the sedation                            options and risks were discussed with the patient.                            All questions were answered, and informed consent                            was obtained. Prior Anticoagulants: The patient has                            taken no previous anticoagulant or antiplatelet                            agents. ASA Grade Assessment: II - A patient with                            mild systemic disease. After reviewing the risks                            and benefits, the patient was deemed in                            satisfactory condition to undergo the procedure.                           After obtaining informed consent, the endoscope was  passed under direct vision. Throughout the                            procedure, the patient's blood pressure, pulse, and                            oxygen saturations were monitored continuously. The                            Endoscope was introduced through the mouth, and                            advanced to the second part of duodenum. The upper       GI endoscopy was accomplished without difficulty.                            The patient tolerated the procedure well. Scope In: Scope Out: Findings:                 The esophagus was normal.                           The stomach was normal.                           The examined duodenum was normal.                           The cardia and gastric fundus were normal on                            retroflexion. Complications:            No immediate complications. Estimated Blood Loss:     Estimated blood loss: none. Impression:               - Normal esophagus.                           - Normal stomach.                           - Normal examined duodenum.                           - No specimens collected. Recommendation:           - Patient has a contact number available for                            emergencies. The signs and symptoms of potential                            delayed complications were discussed with the                            patient. Return to normal activities tomorrow.  Written discharge instructions were provided to the                            patient.                           - Resume previous diet.                           - Continue present medications.                           - I will refer to surgery to evaluate small hernia,                            protuberant abdomen ? severe diastasis recti Gatha Mayer, MD 01/26/2019 4:31:15 PM This report has been signed electronically.

## 2019-01-26 NOTE — Progress Notes (Signed)
Report given to PACU, vss 

## 2019-01-27 ENCOUNTER — Telehealth: Payer: Self-pay

## 2019-01-27 NOTE — Telephone Encounter (Signed)
Referral faxed to [facility] Dr. Lucia Gaskins

## 2019-01-27 NOTE — Telephone Encounter (Signed)
-----   Message from Gatha Mayer, MD sent at 01/26/2019  5:00 PM EDT ----- Regarding: Refer to Dr. Lucia Gaskins Please refer to Dr. Lucia Gaskins of [facility]  Dx  Ventral hernia Diastasis recti  Note that I think her PCP office was also referring but [place] and I want her to see Dr. Alphonsa Overall

## 2019-01-28 ENCOUNTER — Telehealth: Payer: Self-pay | Admitting: *Deleted

## 2019-01-28 NOTE — Telephone Encounter (Signed)
1. Have you developed a fever since your procedure? no  2.   Have you had an respiratory symptoms (SOB or cough) since your procedure? no  3.   Have you tested positive for COVID 19 since your procedure no  4.   Have you had any family members/close contacts diagnosed with the COVID 19 since your procedure?  no   If yes to any of these questions please route to Joylene John, RN and Alphonsa Gin, Therapist, sports.     Follow up Call-  Call back number 01/26/2019 11/10/2018  Post procedure Call Back phone  # [phone] cell [phone]  Permission to leave phone message Yes Yes  Some recent data might be hidden     Patient questions:  Do you have a fever, pain , or abdominal swelling? No. Pain Score  0 *  Have you tolerated food without any problems? Yes.    Have you been able to return to your normal activities? Yes.    Do you have any questions about your discharge instructions: Diet   No. Medications  No. Follow up visit  No.  Do you have questions or concerns about your Care? No.  Actions: * If pain score is 4 or above: No action needed, pain <4.

## 2019-02-04 ENCOUNTER — Other Ambulatory Visit: Payer: Self-pay | Admitting: *Deleted

## 2019-02-04 NOTE — Patient Outreach (Signed)
[facility]) Care Management  02/04/2019  Nichole Garza Dec 18, 1960 [id number]   [place]  Referral Date:11/17/2017 Referral Source:Health Risk Assessment Screening Reason for Referral:Disease Management Education Insurance:[employer] Medicare   Outreach Attempt:  Outreach attempt #3 to patient for follow up.  Patient answered and stated she was driving, requested telephone call back.   Plan:  RN Health Coach will make another outreach attempt within the month of November per patient's request.  [name] RN [place] [phone] [email or web address]

## 2019-02-04 NOTE — Patient Outreach (Signed)
[facility]) Care Management  02/04/2019  Kimberley Shaw-Falconer [birth year] [id number]   CSW was able to make contact with patient today to follow-up regarding social work services and resources, as well as to provide counseling and supportive services for symptoms of anxiety and depression.  Patient admitted to being "quite depressed" today, feeling as though she is punishing her mother by not being able to lay her to rest yet.  Patient reported that she and her siblings just shipped her deceased mother's body to [place] last Thursday, January 28, 2019, but that they are still unable to make arrangements for a proper funeral and burial, due to COVID-19 restrictions, the weather in [place] and various family members inability to travel because of work or school, and lack of funds for travel expenses.  Patient regretted to admit that her mother died roughly two months ago.  Patient stated, "My mother would have been 37 on the 7th of November, just 2 days from now".  Patient went on to say that once everyone arrives in [place], they are required to spend 14 days in quarantine, before even planning the funeral arrangements.  Patient indicated that she, and her 7 out of 10 remaining siblings, need to make airfare accommodations for their father, deceased mother's husband, in addition to their 4 adopted siblings, 68 grandchildren, 1 great grandchildren and 7 great, great grandchildren.  That does not even include her mother's Godchildren, friends, siblings, aunts, uncles, cousins, etc.  Patient is yet to find a church that is willing to accommodate that many people and reported that certain customs, rituals and traditions must be performed in the deceased's home before the funeral and burial services can even take place.  Patient is under the impression that these services will need to occur outside, but there is too much flooding in the area of [place] where they are from.  Patient appeared to  be very fixated on the burial of her deceased mother, feeling as though she is not allowing her mother the ability to rest in peace.  After thorough review and education of [place] traditions concerning death, CSW learned that one of the strongest traditions in [place] culture is the wake, also called Nine Night or Set Up.  Family and friends gather at the dead person's home to comfort the bereaved, and to give the spirit a good send off from this world. This may be done on one or many nights, with the ninth night or the night immediately preceding the funeral being of the most importance.  It was an African belief that the person's duppy would live on and become a nuisance to the survivors if not treated with respect before burial.  Refreshments are usually provided by the family, often with the financial assistance of friends and well wishers, so patient is very concerned about being able to finance this large expense.  Patient was very tearful today, admitting that the death of her mother is causing her to lose sleep, have decreased appetite, feel restless and anxious and cry uncontrollably.  CSW explained to patient that these are all normal signs and symptoms of the grieving process, but that if these symptoms persist, patient may want to consider further psychiatric evaluation and/or additional psychotropic medications.  Patient stated, "I just can't believe she is dead, she said she was tired and wanted to lie down, but died in her sleep".  Patient was grateful that her mother appeared to die peacefully, verbalizing "she does not even look as though  she is dead".  Patient reported that she is not in denial about her mother's death, just wishing that she could have spent a lot more time with her before she died.  Patient admitted to obtaining emotional and spiritual support from congregation members at her church, friends and numerous family members.     CSW was able to offer Grief and Loss counseling  services to patient today, as well as Person-Centered Therapy.  Patient admitted that she is definitely reading her Bible a lot more, relying on God for comfort and strength, and getting out of the house more.  Patient went on to say that she is trying to stay busy in order to keep her mind off of her sadness and frustration, believing that her grief is only exacerbating her Fibromyalgia symptoms.  Patient was in the process of leaving the house to visit a close friend, one that also lost her mother recently, feeling as though they can share in their grief process.  CSW agreed to follow-up with patient again in two weeks, per patient's request, on Thursday, February 18, 2019, around 9:30AM.  Patient knows that she can contact CSW if she needs counseling or various other social work services in the meantime, confirming that she has the correct contact information for CSW and hours of operation.  [name], BSW, MSW, LCSW  Licensed Education officer, environmental Health System  Mailing [place] [address], [place], [place] [address] Physical Address-[address], [place],  [address] Toll Free Main # [phone] Fax # [phone] Cell # [phone]  Office # [phone] [name]

## 2019-02-05 ENCOUNTER — Encounter: Payer: Self-pay | Admitting: *Deleted

## 2019-02-05 ENCOUNTER — Other Ambulatory Visit: Payer: Self-pay | Admitting: *Deleted

## 2019-02-05 NOTE — Patient Outreach (Addendum)
[facility]) Care Management  [facility] Care Manager  02/05/2019   Nichole Garza [birth year] [id number]   [place] Quarterly Outreach   Referral Date:  11/17/2017 Referral Source:  Health Risk Assessment Screening Reason for Referral:  Disease Management Education Insurance:  [employer]   Outreach Attempt:  Successful telephone outreach to patient for follow up.  HIPAA verified with patient.  Patient very sad and tearful over mothers passing.  States she is hoping to be able to bury her mother soon in [place] but sad because it is taking so long and not sure if all family will be able to make the burial.  Offered condolences and words of comfort.  Continues to report abdominal bloating.  States she has had a normal endoscopy and is now awaiting surgical consultation for hernia.  Patient stating she wants to concentrate on mother's burial and getting to [place] and wants to hold off on surgical consultation until she comes back.  States she does not want "any surgery before burying her mother".  Encouraged patient to maybe attend surgical consultation prior to going out of town and if surgery is needed discuss with provider and if able scheduled after returning from mothers burial.  Patient stated she would think about this.  Does report increase in her blood sugars related to increase in stress.  Has not checked blood sugar yet today but fasting blood sugar yesterday was 180.  States her last Hgb A1C was 9.  Does report pain in had is better since initiation of Celebrex.  Encounter Medications:  Outpatient Encounter Medications as of 02/05/2019  Medication Sig Note  . amLODipine (NORVASC) 5 MG tablet Take 5 mg by mouth daily.   Marland Kitchen aspirin EC 81 MG tablet Take 81 mg by mouth every morning.   . Black Cohosh 20 MG TABS Take by mouth.   . celecoxib (CELEBREX) 200 MG capsule  02/05/2019: Taking 1 capsule twice a day  . clonazePAM (KLONOPIN) 0.5 MG tablet Take  1 tablet (0.5 mg total) by mouth at bedtime as needed for anxiety.   . DULoxetine (CYMBALTA) 30 MG capsule Take 60 mg by mouth daily.    . fish oil-omega-3 fatty acids 1000 MG capsule Take 1 g by mouth 3 (three) times daily.    Marland Kitchen gabapentin (NEURONTIN) 800 MG tablet Take 800 mg by mouth 3 (three) times daily.   Marland Kitchen GINKGO BILOBA COMPLEX PO Take 1 capsule by mouth 2 (two) times a day.    Marland Kitchen glimepiride (AMARYL) 4 MG tablet Take 1 tablet by mouth 2 (two) times daily.   Marland Kitchen glucose blood (ONETOUCH VERIO) test strip USE 1 STRIP TO CHECK GLUCOSE TWICE DAILY   . linaclotide (LINZESS) 290 MCG CAPS capsule Take 1 capsule (290 mcg total) by mouth daily before breakfast.   . metFORMIN (GLUCOPHAGE) 500 MG tablet Take 750 mg by mouth 2 (two) times daily with a meal.    . Misc Natural Products (COLON CLEANSE) CAPS Take 1 capsule by mouth 2 (two) times daily. 02/05/2019: Taking as needed  . Multiple Vitamin (MULTIVITAMIN WITH MINERALS) TABS Take 1 tablet by mouth daily.   . pravastatin (PRAVACHOL) 10 MG tablet Take 10 mg by mouth at bedtime. 08/20/2013: .   . vitamin C (ASCORBIC ACID) 500 MG tablet Take 500 mg by mouth daily.   Marland Kitchen tiZANidine (ZANAFLEX) 4 MG capsule Take 4 mg by mouth daily. Take 2 tablets once daily 02/05/2019: Reports not taking at this time  No facility-administered encounter medications on file as of 02/05/2019.     Functional Status:  In your present state of health, do you have any difficulty performing the following activities: 11/25/2018 11/24/2018  Hearing? N Y  Vision? N N  Difficulty concentrating or making decisions? Y Y  Comment "Fibromyalgia Fog". becoming very forgetful and losing concentration while driving  Walking or climbing stairs? N N  Dressing or bathing? N N  Doing errands, shopping? N N  Preparing Food and eating ? N N  Using the Toilet? N N  In the past six months, have you accidently leaked urine? N N  Do you have problems with loss of bowel control? N N  Managing your  Medications? N N  Managing your Finances? N N  Housekeeping or managing your Housekeeping? N N  Some recent data might be hidden    Fall/Depression Screening: Fall Risk  02/05/2019 11/25/2018 11/24/2018  Falls in the past year? 0 0 0  Number falls in past yr: - 0 -  Injury with Fall? - 0 -  Risk for fall due to : Medication side effect;Impaired mobility;Impaired balance/gait History of fall(s);Impaired balance/gait;Medication side effect Medication side effect;History of fall(s);Impaired balance/gait  Follow up Falls evaluation completed;Education provided;Falls prevention discussed Education provided;Falls prevention discussed Falls evaluation completed;Education provided;Falls prevention discussed   PHQ 2/9 Scores 11/25/2018 11/24/2018 06/15/2018 01/14/2018 01/08/2018 11/17/2017  PHQ - 2 Score _0 PHQ- 9 Score _1 -   THN CM Care Plan Problem One     Most Recent Value  Care Plan Problem One  Knowledge defiecit related to self care management of diabetes and grief over mothers passing.  Role Documenting the Problem One  Nichole Garza for Problem One  Active  [facility] Long Term Goal   Patient will verbalize reduction in A1C by 0.2 points with in the next 90 days.  THN Long Term Goal Start Date  02/05/19  THN Long Term Goal Met Date  02/05/19  Interventions for Problem One Long Term Goal  Congratulated patient on reduction of A1C and taking the initiative to find out current A1C from primary care provider, current care plan and goals reviewed and discussed with patient, encouraged to keep and attend scheduled medical appointments, offered patient condolences for her mothers passing and discussed her plans for burial in her home country of [place], provided words of comfort to patient during this pressing time and encouraged patient she is doing the best she can with all the complications currently, reviewed medications and encouraged medication compliance, discussed and  encouraged patient to have surgical consultation prior to traveling to [facility] and possible surgery after travel (per patient request), encouraged patient to continue to work with [facility] for depression     Appointments:  States she attended appointment with Caryl Pina PA at primary care office on 01/26/2019.  Awaiting referral for surgical consultation.  Plan: RN Health Coach will make next telephone outreach to patient within the month of December. RN Health Coach will send primary care provider quarterly update.  [place] [phone] [email or web address]

## 2019-02-15 NOTE — Telephone Encounter (Signed)
Patient cancelled her 02/11/19 appt with Dr. Lucia Gaskins at [facility] due to a death in the family.  CCS has in their notes she will call them to reschedule.

## 2019-02-18 ENCOUNTER — Other Ambulatory Visit: Payer: Self-pay | Admitting: *Deleted

## 2019-02-18 DIAGNOSIS — Z03818 Encounter for observation for suspected exposure to other biological agents ruled out: Secondary | ICD-10-CM | POA: Diagnosis not present

## 2019-02-18 DIAGNOSIS — Z9189 Other specified personal risk factors, not elsewhere classified: Secondary | ICD-10-CM | POA: Diagnosis not present

## 2019-02-18 NOTE — Patient Outreach (Signed)
[facility]) Care Management  02/18/2019  Nichole Garza [birth year] [id number]   CSW made an attempt to try and contact patient today to follow-up regarding social work services and resources, as well as provide counseling and supportive services for symptoms of anxiety and depression.  However, patient was unavailable at the time of CSW's call.  CSW left a HIPAA complaint message on voicemail and is currently awaiting a return call.  CSW will make a second outreach attempt within the next 3-4 business days, if a return call is not received from the patient in the meantime.  Nichole Garza, BSW, MSW, LCSW  Licensed Education officer, environmental Health System  Mailing [place] [address] [place], [place] [address] Physical Address-[address], [place], [place] [address] Toll Free Main # [phone] Fax # [phone] Cell # [phone]  Office # [phone] [email or web address]

## 2019-02-24 ENCOUNTER — Other Ambulatory Visit: Payer: Self-pay | Admitting: *Deleted

## 2019-02-24 ENCOUNTER — Encounter: Payer: Self-pay | Admitting: *Deleted

## 2019-02-24 NOTE — Patient Outreach (Signed)
[facility]) Care Management  02/24/2019  Doralene Shaw-Falconer [birth year] [id number]   CSW made a second attempt to try and contact patient today to follow-up regarding social work services and resources, as well as provide counseling and supportive services for symptoms of depression, anxiety and grief and loss; however, patient was unavailable at the time of CSW's call.  CSW left a HIPAA compliant message on voicemail for patient and is currently awaiting a return call.  CSW will make a third and final outreach attempt within the next 3-4 business days, if a return call is not received from patient in the meantime.  CSW will also mail an "Unsuccessful Outreach Letter" to patient's home, encouraging patient to contact CSW directly if she is interested in continuing to receive social work services through [place] with Scientist, clinical (histocompatibility and immunogenetics).    [name], BSW, MSW, LCSW  Licensed Education officer, environmental Health System  Mailing [place] [address] [place], [place] [address] Physical Address-[address], [place], [place] [address] Toll Free Main # [phone] Fax # [phone] Cell # [phone]  Office # [phone] [name]

## 2019-03-04 ENCOUNTER — Other Ambulatory Visit: Payer: Self-pay | Admitting: *Deleted

## 2019-03-04 NOTE — Patient Outreach (Signed)
[facility]) Care Management  03/04/2019  Olina Shaw-Falconer 09/10/1960 [id number]    CSW made a third and final attempt to try and contact patient today to follow-up regarding social work services and resources, as well as to provide counseling and supportive services for symptoms of anxiety, depression and grief and loss; however, patient was unavailable at the time of CSW's call.  CSW left a HIPAA compliant message for patient on voicemail and CSW is currently awaiting a return call.  CSW will proceed with case closure in two business days, if a return call is not received from patient in the meantime, as required number of phone attempts have been made and an outreach letter was mailed to patient's home allowing 10 business days for a response.  [name], BSW, MSW, LCSW  Licensed Education officer, environmental Health System  Mailing [place] [address], [place], [place] [address] Physical Address-[address], [place], [place] [address] Toll Free Main # [phone] Fax # [phone] Cell # [phone]  Office # [phone] [name]

## 2019-03-08 ENCOUNTER — Encounter: Payer: Self-pay | Admitting: *Deleted

## 2019-03-08 ENCOUNTER — Other Ambulatory Visit: Payer: Self-pay | Admitting: *Deleted

## 2019-03-08 NOTE — Patient Outreach (Signed)
[facility]) Care Management  03/08/2019  Nichole Garza 1961/01/10 [id number]    CSW will perform a case closure on patient, due to inability to maintain phone contact with patient, despite required number of phone attempts made and outreach letter mailed to patient's home, allowing 10 business days for a response if patient was interested in continuing to receive social work services and resources through [place] with Scientist, clinical (histocompatibility and immunogenetics).  CSW will notify patient's Telephonic RNCM, [name], also with [place] Management, of CSW's plans to close patient's case.  CSW will fax an update to patient's Primary Care Physician, Dr. [name] Osei-Bonsu to ensure that they are aware of CSW's involvement with patient's plan of care.  CSW will also mail a Case Closure Letter to patient's home.  [name], BSW, MSW, LCSW  Licensed Education officer, environmental Health System  Mailing [place] [address], [place], [place] [address] Physical Address[address], [place], [place] [address] Toll Free Main # [phone] Fax # [phone] Cell # [phone]  Office # [phone] [name]

## 2019-03-11 ENCOUNTER — Other Ambulatory Visit: Payer: Self-pay | Admitting: *Deleted

## 2019-03-11 NOTE — Patient Outreach (Signed)
[facility]) Care Management  03/11/2019  Michaelina Shaw-Falconer 01-Jun-1960 [id number]   RN Health CoachMonthlyOutreach  Referral Date:11/17/2017 Referral Source:Health Risk Assessment Screening Reason for Referral:Disease Management Education Insurance:[employer] Medicare   Outreach Attempt:  Outreach attempt #1 to patient for follow up.  Received message stating "the person you are trying to reach is not accepting phone calls at this time".  Unable to leave message.    Plan:  RN Health Coach will attempt another outreach within the month of January.  [place] [phone] Angelly [email or web address]

## 2019-03-23 ENCOUNTER — Other Ambulatory Visit: Payer: Self-pay

## 2019-03-23 NOTE — Patient Outreach (Signed)
[place] [facility]) Care Management  03/23/2019  Nichole Garza 31-Jan-1961 [id number]   Medication Adherence call to Nichole Garza Telephone call to Patient regarding Medication Adherence unable to reach patient.paient did not answer,patient is past due on Pravastatin 10 mg under [name].   [place] Management Direct Dial [phone]  Fax [phone] [name]

## 2019-04-22 ENCOUNTER — Other Ambulatory Visit: Payer: Self-pay | Admitting: *Deleted

## 2019-04-22 NOTE — Patient Outreach (Signed)
[facility]) Care Management  04/22/2019  [place] Shaw-Falconer [birth year] [id number]   RN Health CoachMonthlyOutreach  Referral Date:11/17/2017 Referral Source:Health Risk Assessment Screening Reason for Referral:Disease Management Education Insurance:[employer] Medicare   Outreach Attempt:  Outreach attempt #2 to patient for follow up. No answer and unable to leave voicemail message due to voicemail did not engage.  Plan:  RN Health Coach will make another outreach attempt within the month of February.  [place] [phone] Ijanae [email or web address]

## 2019-05-18 DIAGNOSIS — Z03818 Encounter for observation for suspected exposure to other biological agents ruled out: Secondary | ICD-10-CM | POA: Diagnosis not present

## 2019-05-20 ENCOUNTER — Other Ambulatory Visit: Payer: Self-pay | Admitting: *Deleted

## 2019-05-20 NOTE — Patient Outreach (Signed)
[facility]) Care Management  05/20/2019  Nichole Garza 1961-02-24 [id number]   RN Health CoachMonthlyOutreach  Referral Date:11/17/2017 Referral Source:Health Risk Assessment Screening Reason for Referral:Disease Management Education Insurance:[employer] Medicare   Outreach Attempt:  Outreach attempt #3 to patient for follow up.  Patient answered and stated she was on another line.  Requested call back another day.   Plan:  RN Health Coach will make another outreach attempt within the month of March per patient's request.  [name] RN [place] [phone] Alizabeth [email or web address]

## 2019-05-25 DIAGNOSIS — M797 Fibromyalgia: Secondary | ICD-10-CM | POA: Diagnosis not present

## 2019-05-25 DIAGNOSIS — G629 Polyneuropathy, unspecified: Secondary | ICD-10-CM | POA: Diagnosis not present

## 2019-05-25 DIAGNOSIS — G894 Chronic pain syndrome: Secondary | ICD-10-CM | POA: Diagnosis not present

## 2019-06-15 ENCOUNTER — Other Ambulatory Visit: Payer: Self-pay | Admitting: *Deleted

## 2019-06-15 NOTE — Patient Outreach (Signed)
[facility] Garza) Care Management  06/15/2019  Nichole Garza 06-26-1960 [id number]   RN Health CoachMonthlyOutreach  Referral Date:11/17/2017 Referral Source:Health Risk Assessment Screening Reason for Referral:Disease Management Education Insurance:[employer] Medicare   Outreach Attempt:  Outreach attempt #4 to patient for follow up. No answer. RN Health Coach left HIPAA compliant voicemail message along with contact information.  Plan:  RN Health Coach will make another outreach attempt within the month of April if no return call back from patient.  [place] [phone] Nichole [email or web address]

## 2019-07-20 DIAGNOSIS — I10 Essential (primary) hypertension: Secondary | ICD-10-CM | POA: Diagnosis not present

## 2019-07-20 DIAGNOSIS — G43909 Migraine, unspecified, not intractable, without status migrainosus: Secondary | ICD-10-CM | POA: Diagnosis not present

## 2019-07-20 DIAGNOSIS — M797 Fibromyalgia: Secondary | ICD-10-CM | POA: Diagnosis not present

## 2019-07-20 DIAGNOSIS — Z0001 Encounter for general adult medical examination with abnormal findings: Secondary | ICD-10-CM | POA: Diagnosis not present

## 2019-07-20 DIAGNOSIS — E119 Type 2 diabetes mellitus without complications: Secondary | ICD-10-CM | POA: Diagnosis not present

## 2019-07-21 DIAGNOSIS — I10 Essential (primary) hypertension: Secondary | ICD-10-CM | POA: Diagnosis not present

## 2019-07-21 DIAGNOSIS — S0093XA Contusion of unspecified part of head, initial encounter: Secondary | ICD-10-CM | POA: Diagnosis not present

## 2019-07-21 DIAGNOSIS — E119 Type 2 diabetes mellitus without complications: Secondary | ICD-10-CM | POA: Diagnosis not present

## 2019-07-21 DIAGNOSIS — S7001XA Contusion of right hip, initial encounter: Secondary | ICD-10-CM | POA: Diagnosis not present

## 2019-07-22 ENCOUNTER — Other Ambulatory Visit: Payer: Self-pay | Admitting: *Deleted

## 2019-07-22 NOTE — Patient Outreach (Signed)
[place] [facility]) Care Management  07/22/2019  Nichole Garza Dec 13, 1960 [id number]   RN Health CoachMonthlyOutreach  Referral Date:11/17/2017 Referral Source:Health Risk Assessment Screening Reason for Referral:Disease Management Education Insurance:[employer] Medicare   Outreach Attempt:  Outreach attempt #5 to patient for follow up. No answer. RN Health Coach left HIPAA compliant voicemail message along with contact information.  [name] documenting received call back from patient stating she was unable to speak at this time as she is driving.  Requesting call back another day and time.  Plan:  RN Health Coach will make another outreach to patient within the month of May per patients request.  [name] RN [place] [phone] Sorrel [email or web address]

## 2019-08-03 DIAGNOSIS — H5203 Hypermetropia, bilateral: Secondary | ICD-10-CM | POA: Diagnosis not present

## 2019-08-03 DIAGNOSIS — H52223 Regular astigmatism, bilateral: Secondary | ICD-10-CM | POA: Diagnosis not present

## 2019-08-03 DIAGNOSIS — H524 Presbyopia: Secondary | ICD-10-CM | POA: Diagnosis not present

## 2019-08-03 DIAGNOSIS — H2513 Age-related nuclear cataract, bilateral: Secondary | ICD-10-CM | POA: Diagnosis not present

## 2019-08-03 DIAGNOSIS — E119 Type 2 diabetes mellitus without complications: Secondary | ICD-10-CM | POA: Diagnosis not present

## 2019-08-09 DIAGNOSIS — N951 Menopausal and female climacteric states: Secondary | ICD-10-CM | POA: Diagnosis not present

## 2019-08-09 DIAGNOSIS — E1165 Type 2 diabetes mellitus with hyperglycemia: Secondary | ICD-10-CM | POA: Diagnosis not present

## 2019-08-09 DIAGNOSIS — K581 Irritable bowel syndrome with constipation: Secondary | ICD-10-CM | POA: Diagnosis not present

## 2019-08-09 DIAGNOSIS — M797 Fibromyalgia: Secondary | ICD-10-CM | POA: Diagnosis not present

## 2019-08-09 DIAGNOSIS — I10 Essential (primary) hypertension: Secondary | ICD-10-CM | POA: Diagnosis not present

## 2019-08-12 ENCOUNTER — Ambulatory Visit: Payer: Self-pay

## 2019-08-12 ENCOUNTER — Encounter: Payer: Self-pay | Admitting: Orthopedic Surgery

## 2019-08-12 ENCOUNTER — Other Ambulatory Visit: Payer: Self-pay

## 2019-08-12 ENCOUNTER — Ambulatory Visit: Payer: Medicare Other | Admitting: Orthopedic Surgery

## 2019-08-12 VITALS — Ht 61.0 in | Wt 111.0 lb

## 2019-08-12 DIAGNOSIS — M79641 Pain in right hand: Secondary | ICD-10-CM

## 2019-08-12 DIAGNOSIS — M65332 Trigger finger, left middle finger: Secondary | ICD-10-CM

## 2019-08-12 DIAGNOSIS — M79642 Pain in left hand: Secondary | ICD-10-CM | POA: Diagnosis not present

## 2019-08-16 ENCOUNTER — Encounter: Payer: Self-pay | Admitting: Orthopedic Surgery

## 2019-08-16 NOTE — Progress Notes (Signed)
Office Visit Note   Patient: Nichole Garza           Date of Birth: 1960-12-25           MRN: [id number] Visit Date: 08/12/2019              Requested by: Benito Mccreedy, MD [id number] [place],  [place] [id number] PCP: Benito Mccreedy, MD  Chief Complaint  Patient presents with  . Left Hand - Pain  . Right Hand - Pain      HPI: Patient is a 59 year old woman who presents complaining of swelling and triggering of the long finger left hand.  Patient states she manually has to straighten it out.  She states she has decreased grip strength in the morning.  Patient states she has pain over the dorsum of the right wrist and this is the most symptomatic aspect of the right hand.  Patient states she has had nerve conduction studies for the right hand which are reported as her being positive for carpal tunnel syndrome.  I do not have access to these studies.  Patient states she has a history of fibromyalgia.  Patient states she has tried Voltaren gel for the right wrist and left hand without relief.  Assessment & Plan: Visit Diagnoses:  1. Pain in right hand   2. Pain in left hand   3. Trigger finger, left middle finger     Plan: We will plan for A1 pulley release left hand long finger.  Risks and benefits of surgery were discussed including risk of neurovascular injury.  Patient does have a slight mallet deformity to the ring finger and will follow this expectantly.  Patient's most symptoms on the right hand are dorsally over the wrist and recommended trying Voltaren gel and a neoprene wrist splint.  We will obtain 2 view radiographs of the right wrist at follow-up.  Follow-Up Instructions: Return in about 2 weeks (around 08/26/2019).   Ortho Exam  Patient is alert, oriented, no adenopathy, well-dressed, normal affect, normal respiratory effort. Examination of the right hand patient is tender to palpation globally over the dorsum of the right wrist Phalen's  and Tinel's test are negative.  Examination of the left hand she has palpable triggering long finger A1 pulley.  Left wrist is nontender to palpation Phalen's and Tinel's test are negative.  Imaging: No results found. No images are attached to the encounter.  Labs: Lab Results  Component Value Date   HGBA1C 7.9 (H) 02/14/2014   HGBA1C 6.6 (H) 10/19/2012   ESRSEDRATE 22 10/20/2012   REPTSTATUS 02/21/2014 FINAL 02/14/2014   CULT  02/14/2014    NO GROWTH 5 DAYS Performed at [facility]      Lab Results  Component Value Date   ALBUMIN 4.6 11/26/2018   ALBUMIN 4.9 05/26/2018   ALBUMIN 4.0 02/17/2018    No results found for: MG Lab Results  Component Value Date   VD25OH 52.9 02/05/2018    No results found for: PREALBUMIN CBC EXTENDED Latest Ref Rng & Units 11/26/2018 05/26/2018 02/17/2018  WBC 4.0 - 10.5 K/uL 6.1 10.8(H) 6.0  RBC 3.87 - 5.11 MIL/uL 4.48 4.59 4.40  HGB 12.0 - 15.0 g/dL 12.5 12.6 12.2  HCT 36.0 - 46.0 % 39.5 40.1 39.5  PLT 150 - 400 K/uL 314 304 306  NEUTROABS 1.7 - 7.7 K/uL 2.8 5.8 2.9  LYMPHSABS 0.7 - 4.0 K/uL 2.9 4.3(H) 2.6     Body mass index is 20.97  kg/m.  Orders:  Orders Placed This Encounter  Procedures  . XR Hand Complete Right  . XR Hand Complete Left   No orders of the defined types were placed in this encounter.    Procedures: No procedures performed  Clinical Data: No additional findings.  ROS:  All other systems negative, except as noted in the HPI. Review of Systems  Objective: Vital Signs: Ht 5\' 1"  (1.549 m)   Wt 111 lb (50.3 kg)   BMI 20.97 kg/m   Specialty Comments:  No specialty comments available.  PMFS History: Patient Active Problem List   Diagnosis Date Noted  . Grief reaction 01/23/2019  . Ventral hernia without obstruction or gangrene 01/23/2019  . Abnormal CT scan, stomach 01/23/2019  . Abdominal distention 01/23/2019  . Anxiety about health 12/15/2018  . Hx of colonic polyp - ssp 11/15/2018   . Rectocele 11/10/2018  . Carpal tunnel syndrome of right wrist 07/28/2018  . Pain of right upper extremity 06/16/2018  . Menopausal syndrome (hot flushes) 05/12/2018  . Type II diabetes mellitus, uncontrolled ([place]) 04/26/2018  . MGUS (monoclonal gammopathy of unknown significance) 02/05/2018  . Normocytic anemia 02/05/2018  . Thrombocytopenia ([place]) 02/05/2018  . Transient alteration of awareness 01/31/2017  . Chronic pain disorder 06/03/2016  . Diabetes mellitus type 2, controlled ([place]) 02/15/2014  . Neuropathy 10/19/2012  . Fibromyalgia 10/19/2012  . Fibromyositis 10/19/2012  . Headache 10/19/2012   Past Medical History:  Diagnosis Date  . Arthritis    in her spine  . Diabetes mellitus   . Fibromyalgia   . Heart murmur   . Hx of colonic polyp - ssp 11/15/2018   10/2018 diminutive sessile serrated polyp recall 2027 Gatha Mayer, MD, [facility]   . Hypertension   . Kidney stone    kidney stone  . Migraine headache   . Nerve pain   . Rectocele 11/10/2018  . Stroke [facility])    TIA 20 -2000  . TIA (transient ischemic attack)     Family History  Problem Relation Age of Onset  . Diabetes Mother   . Diabetes Father   . Cancer Brother   . Colon cancer Neg Hx   . Esophageal cancer Neg Hx   . Stomach cancer Neg Hx   . Rectal cancer Neg Hx     Past Surgical History:  Procedure Laterality Date  . ABDOMINAL HYSTERECTOMY  2012  . COLONOSCOPY  10/2018  . CYSTOSCOPY WITH RETROGRADE PYELOGRAM, URETEROSCOPY AND STENT PLACEMENT Right 05/29/2012   Procedure: CYSTOSCOPY WITH RETROGRADE PYELOGRAM, URETEROSCOPY AND STENT PLACEMENT;  Surgeon: Hanley Ben, MD;  Location: [facility];  Service: Urology;  Laterality: Right;  . HOLMIUM LASER APPLICATION Right [id number]   Procedure: HOLMIUM LASER APPLICATION;  Surgeon: Hanley Ben, MD;  Location: [facility];  Service: Urology;  Laterality: Right;   Social History   Occupational History  . Occupation: disability    Comment: finromyalgia.  neuropathy  Tobacco Use  . Smoking status: Never Smoker  . Smokeless tobacco: Never Used  Substance and Sexual Activity  . Alcohol use: No  . Drug use: No  . Sexual activity: Yes    Birth control/protection: Surgical

## 2019-08-23 ENCOUNTER — Encounter: Payer: Self-pay | Admitting: *Deleted

## 2019-08-23 ENCOUNTER — Other Ambulatory Visit: Payer: Self-pay | Admitting: *Deleted

## 2019-08-23 NOTE — Patient Outreach (Signed)
[facility]) Care Management  [place]  08/23/2019   Nichole Garza [birth year] [id number]    RN Health Coach Monthly Outreach   Referral Date:  11/17/2017 Referral Source:  Health Risk Assessment Screening Reason for Referral:  Disease Management Education Insurance:  [employer]   Outreach Attempt:  Successful telephone outreach to patient for follow up.  HIPAA verified with patient.  Patient reporting the recent loss of her brother in law and his upcoming viewing.  States she has had about 8 deaths in her family since the month of November.  Her and the family are planning to make another trip to [place] for the burial of her brother in [place].  Condolences and comfort offered to patient.  Encouraged her to contact [place] if needed for grief and depression resources (declines referral at this time).  Does report her Hgb A1C was decreased to 8 at last check.  Also reports she had diabetic eye screening in April 2021 done by [employer].  Has not checked blood sugar this morning but reports fasting ranges have been 120-140's.  Denies any hypoglycemic events.  Does report a fall last month outside of [place].  Has been having hand pain and is awaiting surgical appointment for fingers.  Continues to have stomach bloating and states she is awaiting surgical consultation appointment.  Encounter Medications:  Outpatient Encounter Medications as of 08/23/2019  Medication Sig Note  . amLODipine (NORVASC) 5 MG tablet Take 5 mg by mouth daily.   Marland Kitchen aspirin EC 81 MG tablet Take 81 mg by mouth every morning.   . Black Cohosh 20 MG TABS Take by mouth.   . celecoxib (CELEBREX) 200 MG capsule  02/05/2019: Taking 1 capsule twice a day  . clonazePAM (KLONOPIN) 0.5 MG tablet Take 1 tablet (0.5 mg total) by mouth at bedtime as needed for anxiety.   . DULoxetine (CYMBALTA) 30 MG capsule Take 60 mg by mouth daily.    . fish oil-omega-3 fatty  acids 1000 MG capsule Take 1 g by mouth 3 (three) times daily.    Marland Kitchen gabapentin (NEURONTIN) 800 MG tablet Take 800 mg by mouth 3 (three) times daily.   Marland Kitchen GINKGO BILOBA COMPLEX PO Take 1 capsule by mouth 2 (two) times a day.    Marland Kitchen glimepiride (AMARYL) 4 MG tablet Take 1 tablet by mouth 2 (two) times daily.   . metFORMIN (GLUCOPHAGE) 500 MG tablet Take 750 mg by mouth 2 (two) times daily with a meal.    . Multiple Vitamin (MULTIVITAMIN WITH MINERALS) TABS Take 1 tablet by mouth daily.   . pravastatin (PRAVACHOL) 10 MG tablet Take 10 mg by mouth at bedtime. 08/20/2013: .   Marland Kitchen tiZANidine (ZANAFLEX) 4 MG capsule Take 4 mg by mouth daily. Take 2 tablets once daily 08/23/2019: Reports taking at bedtime  . vitamin C (ASCORBIC ACID) 500 MG tablet Take 500 mg by mouth daily.   Marland Kitchen glucose blood (ONETOUCH VERIO) test strip USE 1 STRIP TO CHECK GLUCOSE TWICE DAILY   . linaclotide (LINZESS) 290 MCG CAPS capsule Take 1 capsule (290 mcg total) by mouth daily before breakfast. (Patient not taking: Reported on 08/23/2019)   . Misc Natural Products (COLON CLEANSE) CAPS Take 1 capsule by mouth 2 (two) times daily. 02/05/2019: Taking as needed   No facility-administered encounter medications on file as of 08/23/2019.    Functional Status:  In your present state of health, do you have any difficulty performing the following  activities: 11/25/2018 11/24/2018  Hearing? N Y  Vision? N N  Difficulty concentrating or making decisions? Y Y  Comment "Fibromyalgia Fog". becoming very forgetful and losing concentration while driving  Walking or climbing stairs? N N  Dressing or bathing? N N  Doing errands, shopping? N N  Preparing Food and eating ? N N  Using the Toilet? N N  In the past six months, have you accidently leaked urine? N N  Do you have problems with loss of bowel control? N N  Managing your Medications? N N  Managing your Finances? N N  Housekeeping or managing your Housekeeping? N N  Some recent data might be  hidden    Fall/Depression Screening: Fall Risk  08/23/2019 02/05/2019 11/25/2018  Falls in the past year? 1 0 0  Number falls in past yr: 0 - 0  Comment [facility] - -  Injury with Fall? 0 - 0  Risk for fall due to : History of fall(s);Medication side effect;Impaired mobility;Orthopedic patient Medication side effect;Impaired mobility;Impaired balance/gait History of fall(s);Impaired balance/gait;Medication side effect  Follow up Falls evaluation completed;Education provided;Falls prevention discussed Falls evaluation completed;Education provided;Falls prevention discussed Education provided;Falls prevention discussed   PHQ 2/9 Scores 11/25/2018 11/24/2018 06/15/2018 01/14/2018 01/08/2018 11/17/2017  PHQ - 2 Score 2 2 3 3 3 1   PHQ- 9 Score 13 13 7 7 7  -   THN CM Care Plan Problem One     Most Recent Value  Care Plan Problem One  Knowledge defiecit related to self care management of diabetes and grief over mothers passing.  Role Documenting the Problem One  [place] for Problem One  Active  [facility] Long Term Goal   Patient will report maintaining A1C of 8 or below within the next 90 days. (8 currently)  THN Long Term Goal Start Date  08/23/19  THN Long Term Goal Met Date  08/23/19  Interventions for Problem One Long Term Goal  Congratulated patient on A1C reduction to 8, Discussed goal A1C and ways to continue to reduce current numbers, encouraged diabetic diet, reviewed medications and encouraged medication compliance, encouraged to keep and attend scheduled medical appointments, offered patient support and condolences for recent deaths in her family, encouraged patient to contact [place] if needed for depression resources (declines at this time), encouraged to continue to increase activity as tolerated, EMMI Diabetic Meal Planning sent, sent EMMI Diabetes Diet, sent Clinical Key Diabetes Mellitus and Exercise     Appointments:  Last attended appointment with  primary care provider on 08/09/2019.  Plan: RN Health Coach will send patient Clinical Key Diabetes Mellitus and Exercise. RN Health Coach will send patient EMMI Diabetic Meal Planning. RN Health Coach will send patient EMMI Diabetes Diet. RN Health Coach will send primary care provider quarterly update. RN Health Coach will make next telephone outreach to patient within the month of July and patient agrees to future outreach.  [place] [phone] [email or web address]

## 2019-09-09 ENCOUNTER — Telehealth: Payer: Self-pay | Admitting: Orthopedic Surgery

## 2019-09-09 NOTE — Telephone Encounter (Signed)
Pt would like to proceed with surgery you discussed last month A1 pulley release. Did Dr. Sharol Given fill out a sheet for this?

## 2019-09-09 NOTE — Telephone Encounter (Signed)
Pt called stating she used to see Dr. Sharol Given and they had discussed surgery once and she would like to see if they could go forward with surgery?  [phone]

## 2019-09-10 ENCOUNTER — Other Ambulatory Visit: Payer: Self-pay

## 2019-09-12 ENCOUNTER — Other Ambulatory Visit: Payer: Self-pay | Admitting: Physician Assistant

## 2019-09-13 ENCOUNTER — Other Ambulatory Visit (HOSPITAL_COMMUNITY)
Admission: RE | Admit: 2019-09-13 | Discharge: 2019-09-13 | Disposition: A | Discharge status: Home / Self Care | Payer: Medicare Other | Source: Ambulatory Visit | Attending: Orthopedic Surgery | Admitting: Orthopedic Surgery

## 2019-09-13 ENCOUNTER — Encounter (HOSPITAL_BASED_OUTPATIENT_CLINIC_OR_DEPARTMENT_OTHER): Payer: Self-pay | Admitting: Orthopedic Surgery

## 2019-09-13 ENCOUNTER — Encounter (HOSPITAL_BASED_OUTPATIENT_CLINIC_OR_DEPARTMENT_OTHER)
Admission: RE | Admit: 2019-09-13 | Discharge: 2019-09-13 | Disposition: A | Discharge status: Home / Self Care | Payer: Medicare Other | Source: Ambulatory Visit | Attending: Orthopedic Surgery | Admitting: Orthopedic Surgery

## 2019-09-13 ENCOUNTER — Other Ambulatory Visit: Payer: Self-pay

## 2019-09-13 DIAGNOSIS — Z8673 Personal history of transient ischemic attack (TIA), and cerebral infarction without residual deficits: Secondary | ICD-10-CM | POA: Diagnosis not present

## 2019-09-13 DIAGNOSIS — Z20822 Contact with and (suspected) exposure to covid-19: Secondary | ICD-10-CM | POA: Diagnosis not present

## 2019-09-13 DIAGNOSIS — Z87442 Personal history of urinary calculi: Secondary | ICD-10-CM | POA: Diagnosis not present

## 2019-09-13 DIAGNOSIS — Z01818 Encounter for other preprocedural examination: Secondary | ICD-10-CM | POA: Diagnosis not present

## 2019-09-13 DIAGNOSIS — I10 Essential (primary) hypertension: Secondary | ICD-10-CM | POA: Diagnosis not present

## 2019-09-13 DIAGNOSIS — Z7984 Long term (current) use of oral hypoglycemic drugs: Secondary | ICD-10-CM | POA: Diagnosis not present

## 2019-09-13 DIAGNOSIS — Z79899 Other long term (current) drug therapy: Secondary | ICD-10-CM | POA: Diagnosis not present

## 2019-09-13 DIAGNOSIS — E119 Type 2 diabetes mellitus without complications: Secondary | ICD-10-CM | POA: Diagnosis not present

## 2019-09-13 DIAGNOSIS — M65332 Trigger finger, left middle finger: Secondary | ICD-10-CM | POA: Diagnosis not present

## 2019-09-13 DIAGNOSIS — Z7982 Long term (current) use of aspirin: Secondary | ICD-10-CM | POA: Diagnosis not present

## 2019-09-13 DIAGNOSIS — F419 Anxiety disorder, unspecified: Secondary | ICD-10-CM | POA: Diagnosis not present

## 2019-09-13 DIAGNOSIS — Z01810 Encounter for preprocedural cardiovascular examination: Secondary | ICD-10-CM | POA: Insufficient documentation

## 2019-09-13 DIAGNOSIS — M797 Fibromyalgia: Secondary | ICD-10-CM | POA: Diagnosis not present

## 2019-09-13 DIAGNOSIS — M1990 Unspecified osteoarthritis, unspecified site: Secondary | ICD-10-CM | POA: Diagnosis not present

## 2019-09-13 LAB — BASIC METABOLIC PANEL
Anion gap: 10 (ref 5–15)
BUN: 10 mg/dL (ref 6–20)
CO2: 26 mmol/L (ref 22–32)
Calcium: 9.6 mg/dL (ref 8.9–10.3)
Chloride: 101 mmol/L (ref 98–111)
Creatinine, Ser: 0.98 mg/dL (ref 0.44–1.00)
GFR calc Af Amer: >60 mL/min (ref >60)
GFR calc non Af Amer: >60 mL/min (ref >60)
Glucose, Bld: 272 mg/dL — ABNORMAL HIGH (ref 70–99)
Potassium: 4.7 mmol/L (ref 3.5–5.1)
Sodium: 137 mmol/L (ref 135–145)

## 2019-09-13 LAB — SARS CORONAVIRUS 2 (TAT 6-24 HRS): SARS Coronavirus 2: NEGATIVE

## 2019-09-13 NOTE — Progress Notes (Signed)
Notified patient and Malachy Mood at Dr. Jess Barters office of blood glucose-272, will recheck day of surgery.

## 2019-09-13 NOTE — Progress Notes (Signed)

## 2019-09-14 ENCOUNTER — Ambulatory Visit (HOSPITAL_BASED_OUTPATIENT_CLINIC_OR_DEPARTMENT_OTHER)
Admission: RE | Admit: 2019-09-14 | Discharge: 2019-09-14 | Disposition: A | Discharge status: Home / Self Care | Payer: Medicare Other | Attending: Orthopedic Surgery | Admitting: Orthopedic Surgery

## 2019-09-14 ENCOUNTER — Encounter (HOSPITAL_BASED_OUTPATIENT_CLINIC_OR_DEPARTMENT_OTHER)
Admission: RE | Disposition: A | Discharge status: Home / Self Care | Payer: Self-pay | Source: Home / Self Care | Attending: Orthopedic Surgery

## 2019-09-14 ENCOUNTER — Ambulatory Visit (HOSPITAL_BASED_OUTPATIENT_CLINIC_OR_DEPARTMENT_OTHER): Payer: Medicare Other | Admitting: Anesthesiology

## 2019-09-14 ENCOUNTER — Other Ambulatory Visit: Payer: Self-pay

## 2019-09-14 ENCOUNTER — Encounter (HOSPITAL_BASED_OUTPATIENT_CLINIC_OR_DEPARTMENT_OTHER): Payer: Self-pay | Admitting: Orthopedic Surgery

## 2019-09-14 DIAGNOSIS — I10 Essential (primary) hypertension: Secondary | ICD-10-CM | POA: Diagnosis not present

## 2019-09-14 DIAGNOSIS — Z8673 Personal history of transient ischemic attack (TIA), and cerebral infarction without residual deficits: Secondary | ICD-10-CM | POA: Diagnosis not present

## 2019-09-14 DIAGNOSIS — Z7984 Long term (current) use of oral hypoglycemic drugs: Secondary | ICD-10-CM | POA: Insufficient documentation

## 2019-09-14 DIAGNOSIS — M797 Fibromyalgia: Secondary | ICD-10-CM | POA: Insufficient documentation

## 2019-09-14 DIAGNOSIS — Z7982 Long term (current) use of aspirin: Secondary | ICD-10-CM | POA: Diagnosis not present

## 2019-09-14 DIAGNOSIS — Z79899 Other long term (current) drug therapy: Secondary | ICD-10-CM | POA: Diagnosis not present

## 2019-09-14 DIAGNOSIS — M65332 Trigger finger, left middle finger: Secondary | ICD-10-CM | POA: Diagnosis not present

## 2019-09-14 DIAGNOSIS — K439 Ventral hernia without obstruction or gangrene: Secondary | ICD-10-CM | POA: Diagnosis not present

## 2019-09-14 DIAGNOSIS — M1990 Unspecified osteoarthritis, unspecified site: Secondary | ICD-10-CM | POA: Insufficient documentation

## 2019-09-14 DIAGNOSIS — Z87442 Personal history of urinary calculi: Secondary | ICD-10-CM | POA: Insufficient documentation

## 2019-09-14 DIAGNOSIS — D696 Thrombocytopenia, unspecified: Secondary | ICD-10-CM | POA: Diagnosis not present

## 2019-09-14 DIAGNOSIS — F419 Anxiety disorder, unspecified: Secondary | ICD-10-CM | POA: Insufficient documentation

## 2019-09-14 DIAGNOSIS — E119 Type 2 diabetes mellitus without complications: Secondary | ICD-10-CM | POA: Diagnosis not present

## 2019-09-14 DIAGNOSIS — G5601 Carpal tunnel syndrome, right upper limb: Secondary | ICD-10-CM | POA: Diagnosis not present

## 2019-09-14 HISTORY — PX: TRIGGER FINGER RELEASE: SHX641

## 2019-09-14 HISTORY — DX: Trigger finger, unspecified finger: M65.30

## 2019-09-14 LAB — GLUCOSE, CAPILLARY
Glucose-Capillary: 129 mg/dL — ABNORMAL HIGH (ref 70–99)
Glucose-Capillary: 132 mg/dL — ABNORMAL HIGH (ref 70–99)

## 2019-09-14 SURGERY — RELEASE, A1 PULLEY, FOR TRIGGER FINGER
Anesthesia: Monitor Anesthesia Care | Site: Hand | Laterality: Left

## 2019-09-14 MED ORDER — ONDANSETRON HCL 4 MG/2ML IJ SOLN
INTRAMUSCULAR | Status: AC
  Filled 2019-09-14: qty 2

## 2019-09-14 MED ORDER — CEFAZOLIN SODIUM-DEXTROSE 2-4 GM/100ML-% IV SOLN: 2.0000 g | INTRAVENOUS | Status: DC

## 2019-09-14 MED ORDER — OXYCODONE HCL 5 MG PO TABS: 5.0000 mg | ORAL_TABLET | Freq: Once | ORAL | Status: DC | PRN

## 2019-09-14 MED ORDER — LIDOCAINE HCL 1 % IJ SOLN
INTRAMUSCULAR | Status: DC | PRN
  Administered 2019-09-14: 10 mL

## 2019-09-14 MED ORDER — PROPOFOL 500 MG/50ML IV EMUL
INTRAVENOUS | Status: DC | PRN
  Administered 2019-09-14: 50 ug/kg/min via INTRAVENOUS

## 2019-09-14 MED ORDER — FENTANYL CITRATE (PF) 100 MCG/2ML IJ SOLN
INTRAMUSCULAR | Status: AC
  Filled 2019-09-14: qty 2

## 2019-09-14 MED ORDER — PROMETHAZINE HCL 25 MG/ML IJ SOLN: 6.2500 mg | INTRAMUSCULAR | Status: DC | PRN

## 2019-09-14 MED ORDER — ONDANSETRON HCL 4 MG/2ML IJ SOLN
INTRAMUSCULAR | Status: DC | PRN
  Administered 2019-09-14: 4 mg via INTRAVENOUS

## 2019-09-14 MED ORDER — MELOXICAM 15 MG PO TABS: ORAL_TABLET | ORAL | 0 refills | Status: DC

## 2019-09-14 MED ORDER — FENTANYL CITRATE (PF) 100 MCG/2ML IJ SOLN
INTRAMUSCULAR | Status: DC | PRN
  Administered 2019-09-14: 100 ug via INTRAVENOUS

## 2019-09-14 MED ORDER — MIDAZOLAM HCL 5 MG/5ML IJ SOLN
INTRAMUSCULAR | Status: DC | PRN
  Administered 2019-09-14: 2 mg via INTRAVENOUS

## 2019-09-14 MED ORDER — OXYCODONE HCL 5 MG/5ML PO SOLN: 5.0000 mg | Freq: Once | ORAL | Status: DC | PRN

## 2019-09-14 MED ORDER — LACTATED RINGERS IV SOLN: INTRAVENOUS | Status: DC

## 2019-09-14 MED ORDER — MIDAZOLAM HCL 2 MG/2ML IJ SOLN
INTRAMUSCULAR | Status: AC
  Filled 2019-09-14: qty 2

## 2019-09-14 MED ORDER — PROPOFOL 10 MG/ML IV BOLUS
INTRAVENOUS | Status: DC | PRN
  Administered 2019-09-14: 40 mg via INTRAVENOUS

## 2019-09-14 MED ORDER — HYDROMORPHONE HCL 1 MG/ML IJ SOLN: 0.2500 mg | INTRAMUSCULAR | Status: DC | PRN

## 2019-09-14 MED ORDER — BUPIVACAINE HCL (PF) 0.5 % IJ SOLN
INTRAMUSCULAR | Status: DC | PRN
  Administered 2019-09-14: 10 mL

## 2019-09-14 SURGICAL SUPPLY — 41 items
BLADE SURG 15 STRL LF DISP TIS (BLADE) ×1 IMPLANT
BLADE SURG 15 STRL SS (BLADE) ×2
BNDG COHESIVE 2X5 TAN STRL LF (GAUZE/BANDAGES/DRESSINGS) ×3 IMPLANT
BNDG CONFORM 3 STRL LF (GAUZE/BANDAGES/DRESSINGS) ×3 IMPLANT
BNDG ESMARK 4X9 LF (GAUZE/BANDAGES/DRESSINGS) IMPLANT
CORD BIPOLAR FORCEPS 12FT (ELECTRODE) ×3 IMPLANT
COVER BACK TABLE 60X90IN (DRAPES) ×3 IMPLANT
COVER MAYO STAND STRL (DRAPES) ×3 IMPLANT
COVER WAND RF STERILE (DRAPES) IMPLANT
DECANTER SPIKE VIAL GLASS SM (MISCELLANEOUS) IMPLANT
DRAPE EXTREMITY T 121X128X90 (DISPOSABLE) ×3 IMPLANT
DRAPE U-SHAPE 47X51 STRL (DRAPES) IMPLANT
DRSG EMULSION OIL 3X3 NADH (GAUZE/BANDAGES/DRESSINGS) ×3 IMPLANT
DURAPREP 26ML APPLICATOR (WOUND CARE) ×3 IMPLANT
GAUZE 4X4 16PLY RFD (DISPOSABLE) IMPLANT
GAUZE SPONGE 4X4 12PLY STRL LF (GAUZE/BANDAGES/DRESSINGS) ×3 IMPLANT
GAUZE XEROFORM 1X8 LF (GAUZE/BANDAGES/DRESSINGS) IMPLANT
GLOVE BIOGEL PI IND STRL 8.5 (GLOVE) ×1 IMPLANT
GLOVE BIOGEL PI INDICATOR 8.5 (GLOVE) ×2
GLOVE SURG ORTHO 8.5 STRL (GLOVE) ×3 IMPLANT
GLOVE SURG ORTHO 9.0 STRL STRW (GLOVE) ×3 IMPLANT
GOWN STRL REUS W/ TWL LRG LVL3 (GOWN DISPOSABLE) ×2 IMPLANT
GOWN STRL REUS W/ TWL XL LVL3 (GOWN DISPOSABLE) ×1 IMPLANT
GOWN STRL REUS W/TWL LRG LVL3 (GOWN DISPOSABLE) ×4
GOWN STRL REUS W/TWL XL LVL3 (GOWN DISPOSABLE) ×2
NEEDLE HYPO 25X1 1.5 SAFETY (NEEDLE) IMPLANT
NS IRRIG 1000ML POUR BTL (IV SOLUTION) IMPLANT
PAD CAST 3X4 CTTN HI CHSV (CAST SUPPLIES) ×1 IMPLANT
PADDING CAST ABS 4INX4YD NS (CAST SUPPLIES)
PADDING CAST ABS COTTON 4X4 ST (CAST SUPPLIES) IMPLANT
PADDING CAST COTTON 3X4 STRL (CAST SUPPLIES) ×2
PADDING UNDERCAST 2 STRL (CAST SUPPLIES)
PADDING UNDERCAST 2X4 STRL (CAST SUPPLIES) IMPLANT
SET BASIN DAY SURGERY F.S. (CUSTOM PROCEDURE TRAY) ×3 IMPLANT
STOCKINETTE 4X48 STRL (DRAPES) ×3 IMPLANT
SUT ETHILON 4 0 PS 2 18 (SUTURE) ×3 IMPLANT
SUT VICRYL 4-0 PS2 18IN ABS (SUTURE) IMPLANT
SYR BULB EAR ULCER 3OZ GRN STR (SYRINGE) ×3 IMPLANT
SYR CONTROL 10ML LL (SYRINGE) ×3 IMPLANT
TOWEL GREEN STERILE FF (TOWEL DISPOSABLE) ×6 IMPLANT
UNDERPAD 30X36 HEAVY ABSORB (UNDERPADS AND DIAPERS) ×3 IMPLANT

## 2019-09-14 NOTE — Anesthesia Postprocedure Evaluation (Signed)
Anesthesia Post Note  Patient: Nichole Garza  Procedure(s) Performed: LEFT LONG FINGER A-1 PULLEY RELEASE (Left Hand)     Patient location during evaluation: PACU Anesthesia Type: MAC Level of consciousness: awake and alert Pain management: pain level controlled Vital Signs Assessment: post-procedure vital signs reviewed and stable Respiratory status: spontaneous breathing, nonlabored ventilation and respiratory function stable Cardiovascular status: blood pressure returned to baseline and stable Postop Assessment: no apparent nausea or vomiting Anesthetic complications: no   No complications documented.  Last Vitals:  Vitals:   09/14/19 1000 09/14/19 1021  BP: (!) 134/96 132/80  Pulse: 78 80  Resp:  16  Temp:  36.7 C  SpO2: 99% 99%    Last Pain:  Vitals:   09/14/19 1021  TempSrc:   PainSc: 0-No pain                 Lynda Rainwater

## 2019-09-14 NOTE — Op Note (Signed)
09/14/2019  9:09 AM  PATIENT:  Nichole Garza    PRE-OPERATIVE DIAGNOSIS:  Trigger finger Left Long finger  POST-OPERATIVE DIAGNOSIS:  Same  PROCEDURE:  LEFT LONG FINGER A-1 PULLEY RELEASE  SURGEON:  Newt Minion, MD  PHYSICIAN ASSISTANT:None ANESTHESIA:   General  PREOPERATIVE INDICATIONS:  [name] is a  59 y.o. female with a diagnosis of Trigger finger Left Long finger who failed conservative measures and elected for surgical management.    The risks benefits and alternatives were discussed with the patient preoperatively including but not limited to the risks of infection, bleeding, nerve injury, cardiopulmonary complications, the need for revision surgery, among others, and the patient was willing to proceed.  OPERATIVE IMPLANTS: None  _0 @  OPERATIVE FINDINGS: A1 pulley release with good excursion of the tendon after release.  No cystic changes in the tendon.  OPERATIVE PROCEDURE: Patient was brought the operating room and underwent a MAC anesthetic.  After adequate levels anesthesia obtained patient's left upper extremity was prepped using DuraPrep draped into a sterile field a timeout was called.  The area of the A1 pulley was locally infiltrated with a total of 10 cc half percent Marcaine plain and 1% lidocaine plain.  An incision was made over the distal palmar crease in line with the A1 pulley long finger.  Blunt dissection was carried down to the A1 pulley the neurovascular bundles were protected and a 15 blade knife was used under direct visualization to incise the A1 pulley.  The tendon was delivered to the surgical field there is no tears no cystic changes the tendon had good excursion.  The wound was irrigated normal saline incision was closed using 4-0 nylon a sterile dressing was applied.   DISCHARGE PLANNING:  Antibiotic duration: None indicated  Weightbearing: Not applicable  Pain medication: Extra strength Tylenol  Dressing care/  Wound VAC: Remove dressing in 5 days  Ambulatory devices: Not applicable  Discharge to: home   Follow-up: In the office 1 week post operative.

## 2019-09-14 NOTE — Discharge Instructions (Signed)
May remove dressing in 5 days   Call your surgeon if you experience:   1.  Fever over 101.0. 2.  Inability to urinate. 3.  Nausea and/or vomiting. 4.  Extreme swelling or bruising at the surgical site. 5.  Continued bleeding from the incision. 6.  Increased pain, redness or drainage from the incision. 7.  Problems related to your pain medication. 8.  Any problems and/or concerns    Post Anesthesia Home Care Instructions  Activity: Get plenty of rest for the remainder of the day. A responsible individual must stay with you for 24 hours following the procedure.  For the next 24 hours, DO NOT: -Drive a car -Paediatric nurse -Drink alcoholic beverages -Take any medication unless instructed by your physician -Make any legal decisions or sign important papers.  Meals: Start with liquid foods such as gelatin or soup. Progress to regular foods as tolerated. Avoid greasy, spicy, heavy foods. If nausea and/or vomiting occur, drink only clear liquids until the nausea and/or vomiting subsides. Call your physician if vomiting continues.  Special Instructions/Symptoms: Your throat may feel dry or sore from the anesthesia or the breathing tube placed in your throat during surgery. If this causes discomfort, gargle with warm salt water. The discomfort should disappear within 24 hours.  If you had a scopolamine patch placed behind your ear for the management of post- operative nausea and/or vomiting:  1. The medication in the patch is effective for 72 hours, after which it should be removed.  Wrap patch in a tissue and discard in the trash. Wash hands thoroughly with soap and water. 2. You may remove the patch earlier than 72 hours if you experience unpleasant side effects which may include dry mouth, dizziness or visual disturbances. 3. Avoid touching the patch. Wash your hands with soap and water after contact with the patch.

## 2019-09-14 NOTE — H&P (Signed)
Nichole Garza is an 59 y.o. female.   Chief Complaint: left hand long finger trigger finger HPI: Patient is a 59 year old woman who presents complaining of swelling and triggering of the long finger left hand.  Patient states she manually has to straighten it out.  She states she has decreased grip strength in the morning.  Patient states she has pain over the dorsum of the right wrist and this is the most symptomatic aspect of the right hand.  Patient states she has had nerve conduction studies for the right hand which are reported as her being positive for carpal tunnel syndrome.  I do not have access to these studies.  Patient states she has a history of fibromyalgia.  Patient states she has tried Voltaren gel for the right wrist and left hand without relief.  Past Medical History:  Diagnosis Date   Arthritis    in her spine   Diabetes mellitus    Fibromyalgia    Heart murmur    Hx of colonic polyp - ssp 11/15/2018   10/2018 diminutive sessile serrated polyp recall 2027 Gatha Mayer, MD, [facility]    Hypertension    Kidney stone    kidney stone   Migraine headache    Nerve pain    Rectocele 11/10/2018   Stroke ([place])    TIA 20 -2000   TIA (transient ischemic attack)    Trigger finger of left hand    left long finger    Past Surgical History:  Procedure Laterality Date   ABDOMINAL HYSTERECTOMY  2012   COLONOSCOPY  10/2018   CYSTOSCOPY WITH RETROGRADE PYELOGRAM, URETEROSCOPY AND STENT PLACEMENT Right 05/29/2012   Procedure: CYSTOSCOPY WITH RETROGRADE PYELOGRAM, URETEROSCOPY AND STENT PLACEMENT;  Surgeon: Hanley Ben, MD;  [facility];  Service: Urology;  Laterality: Right;   HOLMIUM LASER APPLICATION Right 07/15/6061   Procedure: HOLMIUM LASER APPLICATION;  Surgeon: Hanley Ben, MD;  [facility];  Service: Urology;  Laterality: Right;    Family History  Problem Relation Age of Onset   Diabetes Mother    Diabetes Father    Cancer Brother     Colon cancer Neg Hx    Esophageal cancer Neg Hx    Stomach cancer Neg Hx    Rectal cancer Neg Hx    Social History:  reports that she has never smoked. She has never used smokeless tobacco. She reports that she does not drink alcohol and does not use drugs.  Allergies:  Allergies  Allergen Reactions   Enalapril Anaphylaxis   Hydrocodone Other (See Comments)    Does not remember what happened-she woke up in her kitchen   Latex Swelling    Lips and face   Relpax [Eletriptan] Other (See Comments)    "fainting"-knocked her out for the whole day    No medications prior to admission.    Results for orders placed or performed during the hospital encounter of 09/14/19 (from the past 48 hour(s))  Basic metabolic panel     Status: Abnormal   Collection Time: 09/13/19 11:30 AM  Result Value Ref Range   Sodium 137 135 - 145 mmol/L   Potassium 4.7 3.5 - 5.1 mmol/L   Chloride 101 98 - 111 mmol/L   CO2 26 22 - 32 mmol/L   Glucose, Bld 272 (H) 70 - 99 mg/dL    Comment: Glucose reference range applies only to samples taken after fasting for at least 8 hours.   BUN 10 6 - 20 mg/dL  Creatinine, Ser 0.98 0.44 - 1.00 mg/dL   Calcium 9.6 8.9 - 10.3 mg/dL   GFR calc non Af Amer >60 >60 mL/min   GFR calc Af Amer >60 >60 mL/min   Anion gap 10 5 - 15    Comment: Performed at [facility] [address] [place], [place] [address]   No results found.  Review of Systems  All other systems reviewed and are negative.   Height 5' (1.524 m), weight 50.3 kg. Physical Exam  Patient is alert, oriented, no adenopathy, well-dressed, normal affect, normal respiratory effort. Examination of the right hand patient is tender to palpation globally over the dorsum of the right wrist Phalen's and Tinel's test are negative.  Examination of the left hand she has palpable triggering long finger A1 pulley.  Left wrist is nontender to palpation Phalen's and Tinel's tVisit Diagnoses:  1. Pain in  right hand   2. Pain in left hand   3. Trigger finger, left middle finger     Plan: We will plan for A1 pulley release left hand long finger.  Risks and benefits of surgery were discussed including risk of neurovascular injury.  Patient does have a slight mallet deformity to the ring finger and will follow this expectantly.  Patient's most symptoms on the right hand are dorsally over the wrist and recommended trying Voltaren gel and a neoprene wrist splint.  We will obtain 2 view radiographs of the right wrist at follow-up. est are negative.Lungs clear Heart RRR Assessment/Plan   Bevely Palmer Montae Stager, PA 09/14/2019, 6:42 AM

## 2019-09-14 NOTE — Transfer of Care (Signed)
Immediate Anesthesia Transfer of Care Note  Patient: Nichole Garza  Procedure(s) Performed: LEFT LONG FINGER A-1 PULLEY RELEASE (Left Hand)  Patient Location: PACU  Anesthesia Type:MAC  Level of Consciousness: sedated  Airway & Oxygen Therapy: Patient Spontanous Breathing and Patient connected to face mask oxygen  Post-op Assessment: Report given to RN and Post -op Vital signs reviewed and stable  Post vital signs: Reviewed and stable  Last Vitals:  Vitals Value Taken Time  BP 119/80 09/14/19 0911  Temp    Pulse 81 09/14/19 0913  Resp 13 09/14/19 0913  SpO2 100 % 09/14/19 0913  Vitals shown include unvalidated device data.  Last Pain:  Vitals:   09/14/19 0735  TempSrc: Oral  PainSc: 3          Complications: No complications documented.

## 2019-09-14 NOTE — Anesthesia Preprocedure Evaluation (Signed)
Anesthesia Evaluation  Patient identified by MRN, date of birth, ID band Patient awake    Reviewed: Allergy & Precautions, H&P , NPO status , Patient's Chart, lab work & pertinent test results, reviewed documented beta blocker date and time   History of Anesthesia Complications (+) Family history of anesthesia reaction  Airway Mallampati: II  TM Distance: >3 FB Neck ROM: Full    Dental  (+) Upper Dentures, Dental Advisory Given   Pulmonary neg pulmonary ROS,    Pulmonary exam normal breath sounds clear to auscultation       Cardiovascular hypertension, Pt. on medications and Pt. on home beta blockers Normal cardiovascular exam Rhythm:Regular Rate:Normal     Neuro/Psych  Headaches, Anxiety TIA Neuromuscular disease negative psych ROS   GI/Hepatic negative GI ROS, Neg liver ROS,   Endo/Other  diabetes, Type 2, Oral Hypoglycemic Agents  Renal/GU negative Renal ROS  negative genitourinary   Musculoskeletal  (+) Arthritis , Osteoarthritis,  Fibromyalgia -  Abdominal   Peds negative pediatric ROS (+)  Hematology negative hematology ROS (+)   Anesthesia Other Findings   Reproductive/Obstetrics negative OB ROS                             Anesthesia Physical  Anesthesia Plan  ASA: III  Anesthesia Plan: MAC   Post-op Pain Management:    Induction: Intravenous  PONV Risk Score and Plan: 2 and Ondansetron, Midazolam and Treatment may vary due to age or medical condition  Airway Management Planned: Simple Face Mask  Additional Equipment:   Intra-op Plan:   Post-operative Plan:   Informed Consent: I have reviewed the patients History and Physical, chart, labs and discussed the procedure including the risks, benefits and alternatives for the proposed anesthesia with the patient or authorized representative who has indicated his/her understanding and acceptance.     Dental advisory  given  Plan Discussed with: CRNA  Anesthesia Plan Comments:         Anesthesia Quick Evaluation

## 2019-09-15 ENCOUNTER — Encounter (HOSPITAL_BASED_OUTPATIENT_CLINIC_OR_DEPARTMENT_OTHER): Payer: Self-pay | Admitting: Orthopedic Surgery

## 2019-09-21 ENCOUNTER — Telehealth: Payer: Self-pay | Admitting: Family

## 2019-09-21 NOTE — Telephone Encounter (Signed)
Received call from [place] she advised patient is out of the country and will not return until July. Nichole Garza said patient left on an emergency. The number to contact Nichole Garza is [phone]

## 2019-09-22 NOTE — Telephone Encounter (Signed)
Pt is s/p a left long finger A1 pulley release 09/14/19

## 2019-10-05 ENCOUNTER — Inpatient Hospital Stay: Payer: Medicare Other | Admitting: Family

## 2019-10-11 ENCOUNTER — Ambulatory Visit (INDEPENDENT_AMBULATORY_CARE_PROVIDER_SITE_OTHER): Payer: Medicare Other | Admitting: Physician Assistant

## 2019-10-11 ENCOUNTER — Other Ambulatory Visit: Payer: Self-pay

## 2019-10-11 ENCOUNTER — Encounter: Payer: Self-pay | Admitting: Orthopedic Surgery

## 2019-10-11 VITALS — Ht 60.0 in | Wt 113.8 lb

## 2019-10-11 DIAGNOSIS — M79642 Pain in left hand: Secondary | ICD-10-CM

## 2019-10-11 NOTE — Progress Notes (Signed)
Office Visit Note   Patient: Nichole Garza           Date of Birth: 1961-03-28           MRN: [id number] Visit Date: 10/11/2019              Requested by: Benito Mccreedy, MD [place] [address] [place],  [place] [address] PCP: Benito Mccreedy, MD  Chief Complaint  Patient presents with  . Left Hand - Routine Post Op    09/14/19 Left long finger A-1 pulley release      HPI: Patient is approximately 1 month status post left long finger A1 pulley release.  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  She had the sutures removed there.  She states the pain in her palm is still about the same as before surgery.  She also states that her whole hand just feels stiff.  She has some paresthesias in the index finger of the long finger and ring finger.  She is happy that her finger is no longer triggering  Assessment & Plan: Visit Diagnoses: No diagnosis found.  Plan: Encouraged the patient to do scar massage.  Also focus on range of motion and stretching.  I demonstrated these with her today.  Follow-up in 3 weeks.  Follow-Up Instructions: No follow-ups on file.   Ortho Exam  Patient is alert, oriented, no adenopathy, well-dressed, normal affect, normal respiratory effort. Overall well-healed surgical incision no cellulitis minimal swelling she does have some callus buildup on either side of the incision.  No drainage.  She is able to bend her finger down.  And release it without any triggering.  Brisk capillary refill in her fingers.  No signs of infection  Imaging: No results found. No images are attached to the encounter.  Labs: Lab Results  Component Value Date   HGBA1C 7.9 (H) 02/14/2014   HGBA1C 6.6 (H) 10/19/2012   ESRSEDRATE 22 10/20/2012   REPTSTATUS 02/21/2014 FINAL 02/14/2014   CULT  02/14/2014    NO GROWTH 5 DAYS Performed at [facility]      Lab Results  Component Value Date   ALBUMIN 4.6 11/26/2018   ALBUMIN 4.9 05/26/2018    ALBUMIN 4.0 02/17/2018    No results found for: MG Lab Results  Component Value Date   VD25OH 52.9 02/05/2018    No results found for: PREALBUMIN CBC EXTENDED Latest Ref Rng & Units 11/26/2018 05/26/2018 02/17/2018  WBC 4.0 - 10.5 K/uL 6.1 10.8(H) 6.0  RBC 3.87 - 5.11 MIL/uL 4.48 4.59 4.40  HGB 12.0 - 15.0 g/dL 12.5 12.6 12.2  HCT 36 - 46 % 39.5 40.1 39.5  PLT 150 - 400 K/uL 314 304 306  NEUTROABS 1.7 - 7.7 K/uL 2.8 5.8 2.9  LYMPHSABS 0.7 - 4.0 K/uL 2.9 4.3(H) 2.6     Body mass index is 22.22 kg/m.  Orders:  No orders of the defined types were placed in this encounter.  No orders of the defined types were placed in this encounter.    Procedures: No procedures performed  Clinical Data: No additional findings.  ROS:  All other systems negative, except as noted in the HPI. Review of Systems  Objective: Vital Signs: Ht 5' (1.524 m)   Wt 113 lb 12.2 oz (51.6 kg)   BMI 22.22 kg/m   Specialty Comments:  No specialty comments available.  PMFS History: Patient Active Problem List   Diagnosis Date Noted  . Trigger middle finger of left  hand   . Grief reaction 01/23/2019  . Ventral hernia without obstruction or gangrene 01/23/2019  . Abnormal CT scan, stomach 01/23/2019  . Abdominal distention 01/23/2019  . Anxiety about health 12/15/2018  . Hx of colonic polyp - ssp 11/15/2018  . Rectocele 11/10/2018  . Carpal tunnel syndrome of right wrist 07/28/2018  . Pain of right upper extremity 06/16/2018  . Menopausal syndrome (hot flushes) 05/12/2018  . Type II diabetes mellitus, uncontrolled ([place]) 04/26/2018  . MGUS (monoclonal gammopathy of unknown significance) 02/05/2018  . Normocytic anemia 02/05/2018  . Thrombocytopenia ([place]) 02/05/2018  . Transient alteration of awareness 01/31/2017  . Chronic pain disorder 06/03/2016  . Diabetes mellitus type 2, controlled ([place]) 02/15/2014  . Neuropathy 10/19/2012  . Fibromyalgia 10/19/2012  . Fibromyositis 10/19/2012  .  Headache 10/19/2012   Past Medical History:  Diagnosis Date  . Arthritis    in her spine  . Diabetes mellitus   . Fibromyalgia   . Heart murmur   . Hx of colonic polyp - ssp 11/15/2018   10/2018 diminutive sessile serrated polyp recall 2027 Gatha Mayer, MD, [facility]   . Hypertension   . Kidney stone    kidney stone  . Migraine headache   . Nerve pain   . Rectocele 11/10/2018  . Stroke [facility])    TIA 20 -2000  . TIA (transient ischemic attack)   . Trigger finger of left hand    left long finger    Family History  Problem Relation Age of Onset  . Diabetes Mother   . Diabetes Father   . Cancer Brother   . Colon cancer Neg Hx   . Esophageal cancer Neg Hx   . Stomach cancer Neg Hx   . Rectal cancer Neg Hx     Past Surgical History:  Procedure Laterality Date  . ABDOMINAL HYSTERECTOMY  2012  . COLONOSCOPY  10/2018  . CYSTOSCOPY WITH RETROGRADE PYELOGRAM, URETEROSCOPY AND STENT PLACEMENT Right 05/29/2012   Procedure: CYSTOSCOPY WITH RETROGRADE PYELOGRAM, URETEROSCOPY AND STENT PLACEMENT;  Surgeon: Hanley Ben, MD;  [facility];  Service: Urology;  Laterality: Right;  . HOLMIUM LASER APPLICATION Right 0/37/0488   Procedure: HOLMIUM LASER APPLICATION;  Surgeon: Hanley Ben, MD;  [facility];  Service: Urology;  Laterality: Right;  . TRIGGER FINGER RELEASE Left 09/14/2019   Procedure: LEFT LONG FINGER A-1 PULLEY RELEASE;  Surgeon: Newt Minion, MD;  Location: [place];  Service: Orthopedics;  Laterality: Left;   Social History   Occupational History  . Occupation: disability    Comment: finromyalgia. neuropathy  Tobacco Use  . Smoking status: Never Smoker  . Smokeless tobacco: Never Used  Vaping Use  . Vaping Use: Never used  Substance and Sexual Activity  . Alcohol use: No  . Drug use: No  . Sexual activity: Yes    Birth control/protection: Surgical

## 2019-10-14 ENCOUNTER — Other Ambulatory Visit: Payer: Self-pay | Admitting: *Deleted

## 2019-10-14 NOTE — Patient Outreach (Signed)
[facility]) Care Management  10/14/2019  [place] Shaw-Falconer 18-Jan-1961 [id number]   RN Health CoachMonthlyOutreach  Referral Date:11/17/2017 Referral Source:Health Risk Assessment Screening Reason for Referral:Disease Management Education Insurance:[employer] Medicare   Outreach Attempt:  Outreach attempt #1 to patient for follow up.  Patient answered and stated she was unable to speak at this time, requested telephone call back.   Plan:  RN Health Coach will make another outreach attempt to patient within the month of July per patient's request.   [name] RN [place] [phone] [email or web address]

## 2019-10-28 ENCOUNTER — Other Ambulatory Visit: Payer: Self-pay | Admitting: *Deleted

## 2019-10-28 NOTE — Patient Outreach (Signed)
[facility] [facility]) Care Management  10/28/2019  Nichole Garza July 11, 1960 [id number]   RN Health CoachMonthlyOutreach  Referral Date:11/17/2017 Referral Source:Health Risk Assessment Screening Reason for Referral:Disease Management Education Insurance:[employer] Medicare   Outreach Attempt:  Outreach attempt #2 to patient for follow up. No answer. RN Health Coach left HIPAA compliant voicemail message along with contact information.  Plan:  RN Health Coach will make another outreach attempt within the month of August if no return call back from patient.   [place] [phone] [email or web address]

## 2019-11-01 ENCOUNTER — Other Ambulatory Visit: Payer: Self-pay

## 2019-11-01 ENCOUNTER — Ambulatory Visit (INDEPENDENT_AMBULATORY_CARE_PROVIDER_SITE_OTHER): Payer: Medicare Other | Admitting: Physician Assistant

## 2019-11-01 ENCOUNTER — Encounter: Payer: Self-pay | Admitting: Physician Assistant

## 2019-11-01 VITALS — Ht 60.0 in | Wt 113.0 lb

## 2019-11-01 DIAGNOSIS — Z1329 Encounter for screening for other suspected endocrine disorder: Secondary | ICD-10-CM | POA: Diagnosis not present

## 2019-11-01 DIAGNOSIS — K581 Irritable bowel syndrome with constipation: Secondary | ICD-10-CM | POA: Diagnosis not present

## 2019-11-01 DIAGNOSIS — Z0000 Encounter for general adult medical examination without abnormal findings: Secondary | ICD-10-CM | POA: Diagnosis not present

## 2019-11-01 DIAGNOSIS — E1165 Type 2 diabetes mellitus with hyperglycemia: Secondary | ICD-10-CM | POA: Diagnosis not present

## 2019-11-01 DIAGNOSIS — Z0001 Encounter for general adult medical examination with abnormal findings: Secondary | ICD-10-CM | POA: Diagnosis not present

## 2019-11-01 DIAGNOSIS — M79642 Pain in left hand: Secondary | ICD-10-CM

## 2019-11-01 DIAGNOSIS — I10 Essential (primary) hypertension: Secondary | ICD-10-CM | POA: Diagnosis not present

## 2019-11-01 NOTE — Progress Notes (Signed)
Office Visit Note   Patient: Nichole Garza           Date of Birth: 1961/02/21           MRN: [id number] Visit Date: 11/01/2019              Requested by: Benito Mccreedy, MD [address] [address] [place],  [place] [address] PCP: Benito Mccreedy, MD  Chief Complaint  Patient presents with  . Left Hand - Follow-up    Long finger A-1 pulley release 09/14/2019      HPI: This is a pleasant 59 year old woman who is 6 weeks status post left long finger A1 pulley release.  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  At the last visit she had a lot of pain and sensitivity.  There was no signs of infection.  She was told to continue to work on range of motion.  She follows up today saying the hand is gotten more sensitive and painful.  She still has some callusing over the incision and thought this was purulent fluid.  She tried opening it herself.  She reports not getting any purulent few fluid out.  She reports lots of electrical and neuropathic symptoms  Assessment & Plan: Visit Diagnoses:  1. Pain in left hand     Plan: Referral will be given to occupational therapy.  I think she does need to work with a hand therapist at this point.  I do not see any infective process today follow-up in 2 weeks  Follow-Up Instructions: No follow-ups on file.   Ortho Exam  Patient is alert, oriented, no adenopathy, well-dressed, normal affect, normal respiratory effort. Focused examination of her hand demonstrates healed surgical incision with callusing.  There is no fluctuance no purulence no drainage.  She has no cellulitis.  Mild soft tissue swelling in the second third and fourth fingers.  She has difficulty flexing the finger secondary to pain.  She also has neuropathic pain in these fingers as well no sign of an infective process  Imaging: No results found. No images are attached to the encounter.  Labs: Lab Results   Component Value Date   HGBA1C 7.9 (H) 02/14/2014   HGBA1C 6.6 (H) 10/19/2012   ESRSEDRATE 22 10/20/2012   REPTSTATUS 02/21/2014 FINAL 02/14/2014   CULT  02/14/2014    NO GROWTH 5 DAYS Performed at [facility]      Lab Results  Component Value Date   ALBUMIN 4.6 11/26/2018   ALBUMIN 4.9 05/26/2018   ALBUMIN 4.0 02/17/2018    No results found for: MG Lab Results  Component Value Date   VD25OH 52.9 02/05/2018    No results found for: PREALBUMIN CBC EXTENDED Latest Ref Rng & Units 11/26/2018 05/26/2018 02/17/2018  WBC 4.0 - 10.5 K/uL 6.1 10.8(H) 6.0  RBC 3.87 - 5.11 MIL/uL 4.48 4.59 4.40  HGB 12.0 - 15.0 g/dL 12.5 12.6 12.2  HCT 36 - 46 % 39.5 40.1 39.5  PLT 150 - 400 K/uL 314 304 306  NEUTROABS 1.7 - 7.7 K/uL 2.8 5.8 2.9  LYMPHSABS 0.7 - 4.0 K/uL 2.9 4.3(H) 2.6     Body mass index is 22.07 kg/m.  Orders:  Orders Placed This Encounter  Procedures  . Ambulatory referral to Occupational Therapy   No orders of the defined types were placed in this encounter.    Procedures: No procedures performed  Clinical Data: No additional findings.  ROS:  All other systems negative, except as noted in the HPI. Review of Systems  Objective: Vital Signs: Ht 5' (1.524 m)   Wt 113 lb (51.3 kg)   BMI 22.07 kg/m   Specialty Comments:  No specialty comments available.  PMFS History: Patient Active Problem List   Diagnosis Date Noted  . Trigger middle finger of left hand   . Grief reaction 01/23/2019  . Ventral hernia without obstruction or gangrene 01/23/2019  . Abnormal CT scan, stomach 01/23/2019  . Abdominal distention 01/23/2019  . Anxiety about health 12/15/2018  . Hx of colonic polyp - ssp 11/15/2018  . Rectocele 11/10/2018  . Carpal tunnel syndrome of right wrist 07/28/2018  . Pain of right upper extremity 06/16/2018  . Menopausal syndrome (hot flushes) 05/12/2018  . Type II diabetes mellitus, uncontrolled ([place]) 04/26/2018  . MGUS (monoclonal  gammopathy of unknown significance) 02/05/2018  . Normocytic anemia 02/05/2018  . Thrombocytopenia ([place]) 02/05/2018  . Transient alteration of awareness 01/31/2017  . Chronic pain disorder 06/03/2016  . Diabetes mellitus type 2, controlled ([place]) 02/15/2014  . Neuropathy 10/19/2012  . Fibromyalgia 10/19/2012  . Fibromyositis 10/19/2012  . Headache 10/19/2012   Past Medical History:  Diagnosis Date  . Arthritis    in her spine  . Diabetes mellitus   . Fibromyalgia   . Heart murmur   . Hx of colonic polyp - ssp 11/15/2018   10/2018 diminutive sessile serrated polyp recall 2027 Gatha Mayer, MD, [facility]   . Hypertension   . Kidney stone    kidney stone  . Migraine headache   . Nerve pain   . Rectocele 11/10/2018  . Stroke [facility])    TIA 20 -2000  . TIA (transient ischemic attack)   . Trigger finger of left hand    left long finger    Family History  Problem Relation Age of Onset  . Diabetes Mother   . Diabetes Father   . Cancer Brother   . Colon cancer Neg Hx   . Esophageal cancer Neg Hx   . Stomach cancer Neg Hx   . Rectal cancer Neg Hx     Past Surgical History:  Procedure Laterality Date  . ABDOMINAL HYSTERECTOMY  2012  . COLONOSCOPY  10/2018  . CYSTOSCOPY WITH RETROGRADE PYELOGRAM, URETEROSCOPY AND STENT PLACEMENT Right 05/29/2012   Procedure: CYSTOSCOPY WITH RETROGRADE PYELOGRAM, URETEROSCOPY AND STENT PLACEMENT;  Surgeon: Hanley Ben, MD;  [facility];  Service: Urology;  Laterality: Right;  . HOLMIUM LASER APPLICATION Right 5/68/1275   Procedure: HOLMIUM LASER APPLICATION;  Surgeon: Hanley Ben, MD;  [facility];  Service: Urology;  Laterality: Right;  . TRIGGER FINGER RELEASE Left 09/14/2019   Procedure: LEFT LONG FINGER A-1 PULLEY RELEASE;  Surgeon: Newt Minion, MD;  Location: [place];  Service: Orthopedics;  Laterality: Left;   Social History   Occupational History  . Occupation: disability    Comment: finromyalgia.  neuropathy  Tobacco Use  . Smoking status: Never Smoker  . Smokeless tobacco: Never Used  Vaping Use  . Vaping Use: Never used  Substance and Sexual Activity  . Alcohol use: No  . Drug use: No  . Sexual activity: Yes    Birth control/protection: Surgical

## 2019-11-10 DIAGNOSIS — Z79899 Other long term (current) drug therapy: Secondary | ICD-10-CM | POA: Diagnosis not present

## 2019-11-10 DIAGNOSIS — E119 Type 2 diabetes mellitus without complications: Secondary | ICD-10-CM | POA: Diagnosis not present

## 2019-11-10 DIAGNOSIS — Z780 Asymptomatic menopausal state: Secondary | ICD-10-CM | POA: Diagnosis not present

## 2019-11-10 DIAGNOSIS — I10 Essential (primary) hypertension: Secondary | ICD-10-CM | POA: Diagnosis not present

## 2019-11-17 DIAGNOSIS — R9431 Abnormal electrocardiogram [ECG] [EKG]: Secondary | ICD-10-CM | POA: Diagnosis not present

## 2019-11-18 ENCOUNTER — Telehealth: Payer: Self-pay | Admitting: Orthopedic Surgery

## 2019-11-18 DIAGNOSIS — G629 Polyneuropathy, unspecified: Secondary | ICD-10-CM | POA: Diagnosis not present

## 2019-11-18 DIAGNOSIS — M79602 Pain in left arm: Secondary | ICD-10-CM | POA: Diagnosis not present

## 2019-11-18 DIAGNOSIS — M797 Fibromyalgia: Secondary | ICD-10-CM | POA: Diagnosis not present

## 2019-11-18 NOTE — Telephone Encounter (Signed)
Called pt and she feels that her s/s or worse and will come in the office on Monday for eval.

## 2019-11-18 NOTE — Telephone Encounter (Signed)
Patient called asking if she can go to another Outpatient Neuro for OT. Patient states they dont have any open appt until middle of Sept. Please call patient about this matter at [phone].

## 2019-11-20 DIAGNOSIS — E119 Type 2 diabetes mellitus without complications: Secondary | ICD-10-CM | POA: Diagnosis not present

## 2019-11-22 ENCOUNTER — Encounter: Payer: Self-pay | Admitting: Orthopedic Surgery

## 2019-11-22 ENCOUNTER — Ambulatory Visit (INDEPENDENT_AMBULATORY_CARE_PROVIDER_SITE_OTHER): Payer: Medicare Other | Admitting: Orthopedic Surgery

## 2019-11-22 VITALS — Ht 60.0 in | Wt 113.0 lb

## 2019-11-22 DIAGNOSIS — M65332 Trigger finger, left middle finger: Secondary | ICD-10-CM

## 2019-11-22 NOTE — Progress Notes (Signed)
Office Visit Note   Patient: Nichole Garza           Date of Birth: [birth year]           MRN: [id number] Visit Date: 11/22/2019              Requested by: Benito Mccreedy, MD [place] [address] [place],  [place] [address] PCP: Benito Mccreedy, MD  Chief Complaint  Patient presents with  . Left Hand - Follow-up    09/14/2019 Left long finger A1 pulley release      HPI: Patient is a 59 year old woman who is 9 weeks status post release A1 pulley left long finger.  Patient states she is having pain over the scar she states she has generalized pain throughout her body secondary to her fibromyalgia.  She states she is under pain management.  Patient also expresses anxiety secondary to staying at home.  Assessment & Plan: Visit Diagnoses:  1. Trigger finger, left middle finger     Plan: Recommend patient massage the scar with Voltaren gel 3 times a day recommended paraffin baths for her hands to help work on range of motion.  Follow-Up Instructions: Return in about 4 weeks (around 12/20/2019).   Ortho Exam  Patient is alert, oriented, no adenopathy, well-dressed, normal affect, normal respiratory effort. Examination patient has no dystrophic changes to the left hand she does have some mild keloiding over the surgical incision and this is tender to palpation.  Patient has full extension of all fingers and lacks flexion of all fingers to about 1 fingerbreadth to the distal palmar crease range of motion of all fingers is equal the triggering has resolved.  Patient has been set up with occupational therapy and her appointment visit has been delayed.  Imaging: No results found. No images are attached to the encounter.  Labs: Lab Results  Component Value Date   HGBA1C 7.9 (H) 02/14/2014   HGBA1C 6.6 (H) 10/19/2012   ESRSEDRATE 22 10/20/2012   REPTSTATUS 02/21/2014 FINAL 02/14/2014   CULT  02/14/2014    NO GROWTH 5 DAYS Performed at [facility]       Lab Results  Component Value Date   ALBUMIN 4.6 11/26/2018   ALBUMIN 4.9 05/26/2018   ALBUMIN 4.0 02/17/2018    No results found for: MG Lab Results  Component Value Date   VD25OH 52.9 02/05/2018    No results found for: PREALBUMIN CBC EXTENDED Latest Ref Rng & Units 11/26/2018 05/26/2018 02/17/2018  WBC 4.0 - 10.5 K/uL 6.1 10.8(H) 6.0  RBC 3.87 - 5.11 MIL/uL 4.48 4.59 4.40  HGB 12.0 - 15.0 g/dL 12.5 12.6 12.2  HCT 36 - 46 % 39.5 40.1 39.5  PLT 150 - 400 K/uL 314 304 306  NEUTROABS 1.7 - 7.7 K/uL 2.8 5.8 2.9  LYMPHSABS 0.7 - 4.0 K/uL 2.9 4.3(H) 2.6     Body mass index is 22.07 kg/m.  Orders:  No orders of the defined types were placed in this encounter.  No orders of the defined types were placed in this encounter.    Procedures: No procedures performed  Clinical Data: No additional findings.  ROS:  All other systems negative, except as noted in the HPI. Review of Systems  Objective: Vital Signs: Ht 5' (1.524 m)   Wt 113 lb (51.3 kg)   BMI 22.07 kg/m   Specialty Comments:  No specialty comments available.  PMFS History: Patient Active Problem List   Diagnosis Date Noted  . Trigger  middle finger of left hand   . Grief reaction 01/23/2019  . Ventral hernia without obstruction or gangrene 01/23/2019  . Abnormal CT scan, stomach 01/23/2019  . Abdominal distention 01/23/2019  . Anxiety about health 12/15/2018  . Hx of colonic polyp - ssp 11/15/2018  . Rectocele 11/10/2018  . Carpal tunnel syndrome of right wrist 07/28/2018  . Pain of right upper extremity 06/16/2018  . Menopausal syndrome (hot flushes) 05/12/2018  . Type II diabetes mellitus, uncontrolled ([place]) 04/26/2018  . MGUS (monoclonal gammopathy of unknown significance) 02/05/2018  . Normocytic anemia 02/05/2018  . Thrombocytopenia ([place]) 02/05/2018  . Transient alteration of awareness 01/31/2017  . Chronic pain disorder 06/03/2016  . Diabetes mellitus type 2, controlled ([place])  02/15/2014  . Neuropathy 10/19/2012  . Fibromyalgia 10/19/2012  . Fibromyositis 10/19/2012  . Headache 10/19/2012   Past Medical History:  Diagnosis Date  . Arthritis    in her spine  . Diabetes mellitus   . Fibromyalgia   . Heart murmur   . Hx of colonic polyp - ssp 11/15/2018   10/2018 diminutive sessile serrated polyp recall 2027 Gatha Mayer, MD, [facility]   . Hypertension   . Kidney stone    kidney stone  . Migraine headache   . Nerve pain   . Rectocele 11/10/2018  . Stroke [facility])    TIA 20 -2000  . TIA (transient ischemic attack)   . Trigger finger of left hand    left long finger    Family History  Problem Relation Age of Onset  . Diabetes Mother   . Diabetes Father   . Cancer Brother   . Colon cancer Neg Hx   . Esophageal cancer Neg Hx   . Stomach cancer Neg Hx   . Rectal cancer Neg Hx     Past Surgical History:  Procedure Laterality Date  . ABDOMINAL HYSTERECTOMY  2012  . COLONOSCOPY  10/2018  . CYSTOSCOPY WITH RETROGRADE PYELOGRAM, URETEROSCOPY AND STENT PLACEMENT Right 05/29/2012   Procedure: CYSTOSCOPY WITH RETROGRADE PYELOGRAM, URETEROSCOPY AND STENT PLACEMENT;  Surgeon: Hanley Ben, MD;  [facility];  Service: Urology;  Laterality: Right;  . HOLMIUM LASER APPLICATION Right 2/70/6237   Procedure: HOLMIUM LASER APPLICATION;  Surgeon: Hanley Ben, MD;  [facility];  Service: Urology;  Laterality: Right;  . TRIGGER FINGER RELEASE Left 09/14/2019   Procedure: LEFT LONG FINGER A-1 PULLEY RELEASE;  Surgeon: Newt Minion, MD;  Location: [place];  Service: Orthopedics;  Laterality: Left;   Social History   Occupational History  . Occupation: disability    Comment: finromyalgia. neuropathy  Tobacco Use  . Smoking status: Never Smoker  . Smokeless tobacco: Never Used  Vaping Use  . Vaping Use: Never used  Substance and Sexual Activity  . Alcohol use: No  . Drug use: No  . Sexual activity: Yes    Birth control/protection:  Surgical

## 2019-11-25 ENCOUNTER — Other Ambulatory Visit: Payer: Self-pay | Admitting: *Deleted

## 2019-11-25 NOTE — Patient Outreach (Signed)
[place]) Care Management  11/25/2019  Michael Shaw-Falconer May 29, 1960 [id number]   RN Health CoachCase Closure  Referral Date:11/17/2017 Referral Source:Health Risk Assessment Screening Reason for Referral:Disease Management Education Insurance:[employer] Medicare   Outreach Attempt:  Successful telephone outreach to patient for follow up.  HIPAA verified with patient.  Patient reporting she has changed primary care providers to Lesia Hausen, [place] with [facility] in [facility].  Reviewed with patient [facility] F[facility] is not a [facility] affiliated provider and [facility] could no longer offer services. Patient stated her understanding and verbalized gratitude of services provided thus far.  Encouraged patient to contact [facility] in the future if she happens to change primary care providers that are affiliated with [facility].  Plan:  RN Health coach will close case and make patient inactive with [facility].  RN Health coach will send patient case closure letter.   [place] [phone] Smith [email or web address]

## 2019-11-26 ENCOUNTER — Encounter: Payer: Self-pay | Admitting: Family

## 2019-11-26 ENCOUNTER — Other Ambulatory Visit: Payer: Self-pay

## 2019-11-26 ENCOUNTER — Ambulatory Visit: Payer: Medicare Other | Admitting: Hematology

## 2019-11-26 ENCOUNTER — Inpatient Hospital Stay: Payer: Medicare Other | Attending: Hematology & Oncology

## 2019-11-26 ENCOUNTER — Inpatient Hospital Stay (HOSPITAL_BASED_OUTPATIENT_CLINIC_OR_DEPARTMENT_OTHER): Payer: Medicare Other | Admitting: Family

## 2019-11-26 ENCOUNTER — Other Ambulatory Visit: Payer: Medicare Other

## 2019-11-26 VITALS — BP 139/86 | HR 94 | Temp 97.8°F | Resp 19 | Ht 60.0 in | Wt 112.1 lb

## 2019-11-26 DIAGNOSIS — Z90710 Acquired absence of both cervix and uterus: Secondary | ICD-10-CM | POA: Diagnosis not present

## 2019-11-26 DIAGNOSIS — D472 Monoclonal gammopathy: Secondary | ICD-10-CM | POA: Insufficient documentation

## 2019-11-26 LAB — CMP (CANCER CENTER ONLY)
ALT: 11 U/L (ref 0–44)
AST: 14 U/L — ABNORMAL LOW (ref 15–41)
Albumin: 4.6 g/dL (ref 3.5–5.0)
Alkaline Phosphatase: 98 U/L (ref 38–126)
Anion gap: 8 (ref 5–15)
BUN: 11 mg/dL (ref 6–20)
CO2: 30 mmol/L (ref 22–32)
Calcium: 10.1 mg/dL (ref 8.9–10.3)
Chloride: 100 mmol/L (ref 98–111)
Creatinine: 1.09 mg/dL — ABNORMAL HIGH (ref 0.44–1.00)
GFR, Est AFR Am: >60 mL/min (ref >60)
GFR, Estimated: 55 mL/min — ABNORMAL LOW (ref >60)
Glucose, Bld: 179 mg/dL — ABNORMAL HIGH (ref 70–99)
Potassium: 3.9 mmol/L (ref 3.5–5.1)
Sodium: 138 mmol/L (ref 135–145)
Total Bilirubin: 0.3 mg/dL (ref 0.3–1.2)
Total Protein: 7.6 g/dL (ref 6.5–8.1)

## 2019-11-26 LAB — CBC WITH DIFFERENTIAL (CANCER CENTER ONLY)
Abs Immature Granulocytes: 0.01 10*3/uL (ref 0.00–0.07)
Basophils Absolute: 0.1 10*3/uL (ref 0.0–0.1)
Basophils Relative: 1 %
Eosinophils Absolute: 0.1 10*3/uL (ref 0.0–0.5)
Eosinophils Relative: 2 %
HCT: 38.2 % (ref 36.0–46.0)
Hemoglobin: 12.3 g/dL (ref 12.0–15.0)
Immature Granulocytes: 0 %
Lymphocytes Relative: 50 %
Lymphs Abs: 2.7 10*3/uL (ref 0.7–4.0)
MCH: 27.5 pg (ref 26.0–34.0)
MCHC: 32.2 g/dL (ref 30.0–36.0)
MCV: 85.5 fL (ref 80.0–100.0)
Monocytes Absolute: 0.3 10*3/uL (ref 0.1–1.0)
Monocytes Relative: 5 %
Neutro Abs: 2.4 10*3/uL (ref 1.7–7.7)
Neutrophils Relative %: 42 %
Platelet Count: 306 10*3/uL (ref 150–400)
RBC: 4.47 MIL/uL (ref 3.87–5.11)
RDW: 13.4 % (ref 11.5–15.5)
WBC Count: 5.5 10*3/uL (ref 4.0–10.5)
nRBC: 0 % (ref 0.0–0.2)

## 2019-11-26 NOTE — Progress Notes (Signed)
Hematology and Oncology Follow Up Visit  Nichole Garza [id number] 1960/12/06 59 y.o. 11/26/2019   Principle Diagnosis:  IgA kappa MGUS  Current Therapy:   Observation   Interim History:  Nichole Garza is here today with Nichole Garza for follow-up. She is doing fairly well but has had issues since having trigger finger surgery on Nichole left hand. She still has numbness and tingling in Nichole fingers.  No swelling or tenderness in Nichole extremities.  She states that she has history of chest pain and is scheduled for a repeat ECHO soon.  No fever, chills, n/v, cough, rash, dizziness, SOB, palpitations, abdominal pain or changes in bowel or bladder habits.  She takes a stool softer and drinks smooth move tea as needed for constipation. She sees Nichole gastroenterologist again in October.  No bleeding, bruising or petechiae.  Has had hysterectomy.  No falls or syncopal episodes to report.  She has maintained a good appetite and is staying well hydrated.   ECOG Performance Status: 1 - Symptomatic but completely ambulatory  Medications:  Allergies as of 11/26/2019      Reactions   Enalapril Anaphylaxis   Hydrocodone Other (See Comments)   Does not remember what happened-she woke up in Nichole kitchen   Latex Swelling   Lips and face   Relpax [eletriptan] Other (See Comments)   "fainting"-knocked Nichole out for the whole day      Medication List       Accurate as of November 26, 2019  1:21 PM. If you have any questions, ask your nurse or doctor.        amLODipine 5 MG tablet Commonly known as: NORVASC Take 5 mg by mouth daily.   aspirin EC 81 MG tablet Take 81 mg by mouth every morning.   Black Cohosh 20 MG Tabs Take by mouth.   Colon Cleanse Caps Take 1 capsule by mouth 2 (two) times daily.   DULoxetine 30 MG capsule Commonly known as: CYMBALTA Take 60 mg by mouth daily.   fish oil-omega-3 fatty acids 1000 MG capsule Take 1 g by mouth 3 (three) times  daily.   gabapentin 800 MG tablet Commonly known as: NEURONTIN Take 800 mg by mouth 3 (three) times daily.   GINKGO BILOBA COMPLEX PO Take 1 capsule by mouth 2 (two) times a day.   glimepiride 4 MG tablet Commonly known as: AMARYL Take 1 tablet by mouth 2 (two) times daily.   meloxicam 15 MG tablet Commonly known as: MOBIC 1 po daily prn discomfort   metFORMIN 500 MG tablet Commonly known as: GLUCOPHAGE Take 750 mg by mouth 2 (two) times daily with a meal.   multivitamin with minerals Tabs tablet Take 1 tablet by mouth daily.   OneTouch Verio test strip Generic drug: glucose blood USE 1 STRIP TO CHECK GLUCOSE TWICE DAILY   pravastatin 10 MG tablet Commonly known as: PRAVACHOL Take 10 mg by mouth at bedtime.   tiZANidine 4 MG capsule Commonly known as: ZANAFLEX Take 4 mg by mouth daily. Take 2 tablets once daily   vitamin C 500 MG tablet Commonly known as: ASCORBIC ACID Take 500 mg by mouth daily.       Allergies:  Allergies  Allergen Reactions  . Enalapril Anaphylaxis  . Hydrocodone Other (See Comments)    Does not remember what happened-she woke up in Nichole kitchen  . Latex Swelling    Lips and face  . Relpax [Eletriptan] Other (See Comments)    "  fainting"-knocked Nichole out for the whole day    Past Medical History, Surgical history, Social history, and Family History were reviewed and updated.  Review of Systems: All other 10 point review of systems is negative.   Physical Exam:  vitals were not taken for this visit.   Wt Readings from Last 3 Encounters:  11/22/19 113 lb (51.3 kg)  11/01/19 113 lb (51.3 kg)  10/11/19 113 lb 12.2 oz (51.6 kg)    Ocular: Sclerae unicteric, pupils equal, round and reactive to light Ear-nose-throat: Oropharynx clear, dentition fair Lymphatic: No cervical or supraclavicular adenopathy Lungs no rales or rhonchi, good excursion bilaterally Heart regular rate and rhythm, no murmur appreciated Abd soft, nontender,  positive bowel sounds, no liver or spleen tip palpated on exam, no fluid wave  MSK no focal spinal tenderness, no joint edema Neuro: non-focal, well-oriented, appropriate affect Breasts: Deferred   Lab Results  Component Value Date   WBC 6.1 11/26/2018   HGB 12.5 11/26/2018   HCT 39.5 11/26/2018   MCV 88.2 11/26/2018   PLT 314 11/26/2018   Lab Results  Component Value Date   FERRITIN 17 02/05/2018   IRON 69 02/05/2018   TIBC 373 02/05/2018   UIBC 304 02/05/2018   IRONPCTSAT 18 (L) 02/05/2018   Lab Results  Component Value Date   RBC 4.48 11/26/2018   Lab Results  Component Value Date   KPAFRELGTCHN 28.3 (H) 11/26/2018   LAMBDASER 15.9 11/26/2018   KAPLAMBRATIO 1.78 (H) 11/26/2018   Lab Results  Component Value Date   IGGSERUM 965 11/26/2018   IGA 677 (H) 11/26/2018   IGMSERUM 43 11/26/2018   Lab Results  Component Value Date   TOTALPROTELP 7.5 11/26/2018   ALBUMINELP 4.0 05/26/2018   A1GS 0.2 05/26/2018   A2GS 0.9 05/26/2018   BETS 1.4 (H) 05/26/2018   GAMS 1.0 05/26/2018   MSPIKE 0.5 (H) 05/26/2018   SPEI Comment 05/26/2018     Chemistry      Component Value Date/Time   NA 137 09/13/2019 1130   K 4.7 09/13/2019 1130   CL 101 09/13/2019 1130   CO2 26 09/13/2019 1130   BUN 10 09/13/2019 1130   CREATININE 0.98 09/13/2019 1130   CREATININE 1.14 (H) 11/26/2018 1421      Component Value Date/Time   CALCIUM 9.6 09/13/2019 1130   ALKPHOS 110 11/26/2018 1421   AST 14 (L) 11/26/2018 1421   ALT 13 11/26/2018 1421   BILITOT 0.2 (L) 11/26/2018 1421       Impression and Plan: Nichole Garza is a very pleasant 59 yo female with IgA kappa MGUS.  Protein studies are pending at this time. If they remain stable she will continue annual follow-up. This is better for Nichole financially.  She will contact our office with any questions or concerns. We can certainly see Nichole sooner if needed.   [name], NP 8/27/20211:21 PM

## 2019-11-29 LAB — MULTIPLE MYELOMA PANEL, SERUM
Albumin SerPl Elph-Mcnc: 4 g/dL (ref 2.9–4.4)
Albumin/Glob SerPl: 1.3 (ref 0.7–1.7)
Alpha 1: 0.2 g/dL (ref 0.0–0.4)
Alpha2 Glob SerPl Elph-Mcnc: 0.8 g/dL (ref 0.4–1.0)
B-Globulin SerPl Elph-Mcnc: 1.3 g/dL (ref 0.7–1.3)
Gamma Glob SerPl Elph-Mcnc: 0.8 g/dL (ref 0.4–1.8)
Globulin, Total: 3.2 g/dL (ref 2.2–3.9)
IgA: 651 mg/dL — ABNORMAL HIGH (ref 87–352)
IgG (Immunoglobin G), Serum: 869 mg/dL (ref 586–1602)
IgM (Immunoglobulin M), Srm: 36 mg/dL (ref 26–217)
M Protein SerPl Elph-Mcnc: 0.5 g/dL — ABNORMAL HIGH
Total Protein ELP: 7.2 g/dL (ref 6.0–8.5)

## 2019-11-29 LAB — KAPPA/LAMBDA LIGHT CHAINS
Kappa free light chain: 29 mg/L — ABNORMAL HIGH (ref 3.3–19.4)
Kappa, lambda light chain ratio: 1.44 (ref 0.26–1.65)
Lambda free light chains: 20.1 mg/L (ref 5.7–26.3)

## 2019-12-10 DIAGNOSIS — Z1231 Encounter for screening mammogram for malignant neoplasm of breast: Secondary | ICD-10-CM | POA: Diagnosis not present

## 2019-12-13 DIAGNOSIS — R9431 Abnormal electrocardiogram [ECG] [EKG]: Secondary | ICD-10-CM | POA: Diagnosis not present

## 2019-12-14 ENCOUNTER — Other Ambulatory Visit: Payer: Self-pay

## 2019-12-14 ENCOUNTER — Ambulatory Visit: Payer: Medicare Other | Attending: Physician Assistant | Admitting: Occupational Therapy

## 2019-12-14 ENCOUNTER — Encounter: Payer: Self-pay | Admitting: Occupational Therapy

## 2019-12-14 DIAGNOSIS — M25642 Stiffness of left hand, not elsewhere classified: Secondary | ICD-10-CM | POA: Insufficient documentation

## 2019-12-14 DIAGNOSIS — M79642 Pain in left hand: Secondary | ICD-10-CM

## 2019-12-14 DIAGNOSIS — R209 Unspecified disturbances of skin sensation: Secondary | ICD-10-CM | POA: Diagnosis not present

## 2019-12-14 DIAGNOSIS — R208 Other disturbances of skin sensation: Secondary | ICD-10-CM

## 2019-12-14 NOTE — Therapy (Signed)
[facility] [address] [place], [place], [address] Phone: [phone]   Fax:  [phone]  Occupational Therapy Evaluation  Patient Details  Name: Nichole Garza MRN: [id number] Date of Birth: 30-Apr-1960 Referring Provider (OT): Bevely Palmer Persons   Encounter Date: 12/14/2019   OT End of Session - 12/14/19 1553    Visit Number 1    Number of Visits 9    Authorization Type [employer] Medicare    Progress Note Due on Visit 10    OT Start Time 1451    OT Stop Time 1532    OT Time Calculation (min) 41 min    Activity Tolerance Patient tolerated treatment well    Behavior During Therapy Anxious           Past Medical History:  Diagnosis Date  . Arthritis    in her spine  . Diabetes mellitus   . Fibromyalgia   . Heart murmur   . Hx of colonic polyp - ssp 11/15/2018   10/2018 diminutive sessile serrated polyp recall 2027 Gatha Mayer, MD, [facility]   . Hypertension   . Kidney stone    kidney stone  . Migraine headache   . Nerve pain   . Rectocele 11/10/2018  . Stroke [facility])    TIA 20 -2000  . TIA (transient ischemic attack)   . Trigger finger of left hand    left long finger    Past Surgical History:  Procedure Laterality Date  . ABDOMINAL HYSTERECTOMY  2012  . COLONOSCOPY  10/2018  . CYSTOSCOPY WITH RETROGRADE PYELOGRAM, URETEROSCOPY AND STENT PLACEMENT Right 05/29/2012   Procedure: CYSTOSCOPY WITH RETROGRADE PYELOGRAM, URETEROSCOPY AND STENT PLACEMENT;  Surgeon: Hanley Ben, MD;  Location: [facility];  Service: Urology;  Laterality: Right;  . HOLMIUM LASER APPLICATION Right 4/85/4627   Procedure: HOLMIUM LASER APPLICATION;  Surgeon: Hanley Ben, MD;  Location: [facility];  Service: Urology;  Laterality: Right;  . TRIGGER FINGER RELEASE Left 09/14/2019   Procedure: LEFT LONG FINGER A-1 PULLEY RELEASE;  Surgeon: Newt Minion, MD;  Location: [facility];  Service: Orthopedics;  Laterality: Left;     There were no vitals filed for this visit.   Subjective Assessment - 12/14/19 1458    Subjective  It should have been healed by now.    Currently in Pain? Yes    Pain Score 8     Pain Location Hand    Pain Orientation Left    Pain Descriptors / Indicators Aching;Burning    Pain Type Acute pain             [facility] OT Assessment - 12/14/19 0001      Assessment   Medical Diagnosis Left long finger trigger finger release    Referring Provider (OT) Bevely Palmer Persons    Onset Date/Surgical Date 09/14/19    Hand Dominance Right    Prior Therapy NA      Precautions   Precautions None      Prior Function   Level of Independence Independent with basic ADLs    Vocation On disability      ADL   Eating/Feeding Independent    Grooming Independent    Upper Body Bathing Independent    Lower Body Bathing Independent    Upper Body Dressing Independent    Lower Body Dressing Independent    ADL comments Difficulty with functional strength, grip in non dominant LUE      IADL   Prior Level  of Function Meal Prep Increased time and effort    Meal Prep Able to complete simple warm meal prep      Written Expression   Dominant Hand Right      Observation/Other Assessments   Focus on Therapeutic Outcomes (FOTO)  NA      Posture/Postural Control   Posture/Postural Control No significant limitations      Sensation   Light Touch Impaired by gross assessment    Stereognosis Appears Intact    Additional Comments Hypersensitive in left palm      Coordination   Fine Motor Movements are Fluid and Coordinated No    Coordination and Movement Description Stiffness in Left digits      ROM / Strength   AROM / PROM / Strength AROM;PROM;Strength      AROM   Overall AROM  Deficits    AROM Assessment Site Finger    Right/Left Finger Left    Left Composite Finger Extension 75%    Left Composite Finger Flexion 75%      Strength   Overall Strength Deficits    Overall Strength Comments  see grip strength      Hand Function   Right Hand Gross Grasp Functional    Right Hand Grip (lbs) 52.4    Left Hand Gross Grasp Impaired    Left Hand Grip (lbs) 23.5                    OT Treatments/Exercises (OP) - 12/14/19 0001      Exercises   Exercises Hand      Hand Exercises   Other Hand Exercises Composite flex/ext      LUE Fluidotherapy   Number Minutes Fluidotherapy 10 Minutes    LUE Fluidotherapy Location Hand;Wrist    Comments for pain relief, prior to range of motion                 OT Education - 12/14/19 1552    Education Details composite flexion and extension, hand desensitization, palm massage    Person(s) Educated Patient    Methods Explanation;Demonstration;Tactile cues;Verbal cues    Comprehension Verbalized understanding;Returned demonstration            OT Short Term Goals - 12/14/19 1610      OT SHORT TERM GOAL #1   Title Patient will complete HEP independently due 01/28/20    Time 4    Period Weeks    Status New    Target Date 01/28/20      OT SHORT TERM GOAL #2   Title Patient will demonstrate 3lb increase in left grip strength    Time 4    Period Weeks    Status New      OT SHORT TERM GOAL #3   Title Patient will report pain no greater than 4/10 during desensitization techniques    Baseline 9/10    Time 4    Period Weeks    Status New             OT Long Term Goals - 12/14/19 1612      OT LONG TERM GOAL #1   Title Patient will complete desensitization, massage, and upgraded HEP I'ly due 02/27/20    Time 8    Period Weeks    Status New    Target Date 02/27/20      OT LONG TERM GOAL #2   Title Patient will report ability to roll dumplings using BUE    Time  8    Period Weeks    Status New      OT LONG TERM GOAL #3   Title Patient will demonstrate 8lb increase in left grip strength    Time 8    Period Weeks    Status New      OT LONG TERM GOAL #4   Title Patient will demonstrate 90% composite  flexion and extension in LUE    Time 8    Period Weeks    Status New                 Plan - 12/14/19 1604    Clinical Impression Statement Patient is a 59 year old woman who had Left long trigger finger release in June.  Patient now with stiffness, pain, some keloid scarring, and weakness in non dominant hand.  Patient will benefit from skilled OT intervention to decrease pain, and increase range of motion, strength and functional use of LUE.    OT Occupational Profile and History Problem Focused Assessment - Including review of records relating to presenting problem    Occupational performance deficits (Please refer to evaluation for details): ADL's;IADL's    Body Structure / Function / Physical Skills ADL;Coordination;Scar mobility;UE functional use;Sensation;Fascial restriction;IADL;Pain;Skin integrity;Wound;Strength;FMC;Dexterity;Edema;ROM    Rehab Potential Good    Clinical Decision Making Limited treatment options, no task modification necessary    Comorbidities Affecting Occupational Performance: May have comorbidities impacting occupational performance    Modification or Assistance to Complete Evaluation  No modification of tasks or assist necessary to complete eval    OT Frequency 1x / week    OT Duration 8 weeks    OT Treatment/Interventions Self-care/ADL training;Iontophoresis;Therapeutic exercise;Patient/family education;Splinting;Moist Heat;Fluidtherapy;Scar mobilization;Therapeutic activities;Passive range of motion;Manual Therapy;DME and/or AE instruction;Contrast Bath;Ultrasound    Plan Fluidotherapy, massage/ palm, desensitization    OT Home Exercise Plan composite flex/ext left UE    Consulted and Agree with Plan of Care Patient           Patient will benefit from skilled therapeutic intervention in order to improve the following deficits and impairments:   Body Structure / Function / Physical Skills: ADL, Coordination, Scar mobility, UE functional use, Sensation,  Fascial restriction, IADL, Pain, Skin integrity, Wound, Strength, FMC, Dexterity, Edema, ROM       Visit Diagnosis: Pain in left hand - Plan: Ot plan of care cert/re-cert  Stiffness of left hand, not elsewhere classified - Plan: Ot plan of care cert/re-cert  Other disturbances of skin sensation - Plan: Ot plan of care cert/re-cert    Problem List Patient Active Problem List   Diagnosis Date Noted  . Trigger middle finger of left hand   . Grief reaction 01/23/2019  . Ventral hernia without obstruction or gangrene 01/23/2019  . Abnormal CT scan, stomach 01/23/2019  . Abdominal distention 01/23/2019  . Anxiety about health 12/15/2018  . Hx of colonic polyp - ssp 11/15/2018  . Rectocele 11/10/2018  . Carpal tunnel syndrome of right wrist 07/28/2018  . Pain of right upper extremity 06/16/2018  . Menopausal syndrome (hot flushes) 05/12/2018  . Type II diabetes mellitus, uncontrolled ([place]) 04/26/2018  . MGUS (monoclonal gammopathy of unknown significance) 02/05/2018  . Normocytic anemia 02/05/2018  . Thrombocytopenia ([place]) 02/05/2018  . Transient alteration of awareness 01/31/2017  . Chronic pain disorder 06/03/2016  . Diabetes mellitus type 2, controlled ([place]) 02/15/2014  . Neuropathy 10/19/2012  . Fibromyalgia 10/19/2012  . Fibromyositis 10/19/2012  . Headache 10/19/2012    Mariah Milling, OTR/L  12/14/2019, 7:02 PM  [facility] [address] [place], [place], [address] Phone: [phone]   Fax:  [phone]  Name: Nichole Garza MRN: [id number] Date of Birth: Feb 09, 1961

## 2019-12-16 DIAGNOSIS — R9431 Abnormal electrocardiogram [ECG] [EKG]: Secondary | ICD-10-CM | POA: Diagnosis not present

## 2019-12-20 ENCOUNTER — Encounter: Payer: Self-pay | Admitting: Orthopedic Surgery

## 2019-12-20 ENCOUNTER — Ambulatory Visit (INDEPENDENT_AMBULATORY_CARE_PROVIDER_SITE_OTHER): Payer: Medicare Other | Admitting: Physician Assistant

## 2019-12-20 VITALS — Ht 60.0 in | Wt 112.0 lb

## 2019-12-20 DIAGNOSIS — M65332 Trigger finger, left middle finger: Secondary | ICD-10-CM

## 2019-12-20 NOTE — Progress Notes (Signed)
Office Visit Note   Patient: Nichole Garza           Date of Birth: 12-24-1960           MRN: [id number] Visit Date: 12/20/2019              Requested by: Benito Mccreedy, MD [facility] [address] [place],  [place] [address] PCP: Benito Mccreedy, MD  Chief Complaint  Patient presents with  . Left Hand - Routine Post Op    09/14/19 left long finger A1 pulley release       HPI: Patient is here in follow-up she is 2 months status post left long finger A1 pulley release.  She has had one session with a hand therapist.  She has been given exercises to do.  She feels that that the finger is improving.  She still has a lot of sensitivity in her hand.  She now is concerned that her ring finger may beginning to trigger.  Assessment & Plan: Visit Diagnoses: No diagnosis found.  Plan: She continue with hand therapy another month.  I do think she is making slow progress.  Considering her fibromyalgia and her sensitivity in her hand and wrist I do not think any surgery would be appropriate on her ring finger at this time.  Follow-Up Instructions: No follow-ups on file.   Ortho Exam  Patient is alert, oriented, no adenopathy, well-dressed, normal affect, normal respiratory effort. Focused examination of her hand demonstrates well-healed surgical incision.  She does have some mild callusing.  She has better movement of her long finger.  She has some limited movement of her ring finger but she is able to flex and extend it.  She is sensitive to touch however she has no cellulitis no swelling pulses are intact  Imaging: No results found. No images are attached to the encounter.  Labs: Lab Results  Component Value Date   HGBA1C 7.9 (H) 02/14/2014   HGBA1C 6.6 (H) 10/19/2012   ESRSEDRATE 22 10/20/2012   REPTSTATUS 02/21/2014 FINAL 02/14/2014   CULT  02/14/2014    NO GROWTH 5 DAYS Performed at [facility]      Lab Results  Component Value Date   ALBUMIN 4.6  11/26/2019   ALBUMIN 4.6 11/26/2018   ALBUMIN 4.9 05/26/2018    No results found for: MG Lab Results  Component Value Date   VD25OH 52.9 02/05/2018    No results found for: PREALBUMIN CBC EXTENDED Latest Ref Rng & Units 11/26/2019 11/26/2018 05/26/2018  WBC 4.0 - 10.5 K/uL 5.5 6.1 10.8(H)  RBC 3.87 - 5.11 MIL/uL 4.47 4.48 4.59  HGB 12.0 - 15.0 g/dL 12.3 12.5 12.6  HCT 36 - 46 % 38.2 39.5 40.1  PLT 150 - 400 K/uL 306 314 304  NEUTROABS 1.7 - 7.7 K/uL 2.4 2.8 5.8  LYMPHSABS 0.7 - 4.0 K/uL 2.7 2.9 4.3(H)     Body mass index is 21.87 kg/m.  Orders:  No orders of the defined types were placed in this encounter.  No orders of the defined types were placed in this encounter.    Procedures: No procedures performed  Clinical Data: No additional findings.  ROS:  All other systems negative, except as noted in the HPI. Review of Systems  Objective: Vital Signs: Ht 5' (1.524 m)   Wt 112 lb (50.8 kg)   BMI 21.87 kg/m   Specialty Comments:  No specialty comments available.  PMFS History: Patient Active Problem List   Diagnosis  Date Noted  . Trigger middle finger of left hand   . Grief reaction 01/23/2019  . Ventral hernia without obstruction or gangrene 01/23/2019  . Abnormal CT scan, stomach 01/23/2019  . Abdominal distention 01/23/2019  . Anxiety about health 12/15/2018  . Hx of colonic polyp - ssp 11/15/2018  . Rectocele 11/10/2018  . Carpal tunnel syndrome of right wrist 07/28/2018  . Pain of right upper extremity 06/16/2018  . Menopausal syndrome (hot flushes) 05/12/2018  . Type II diabetes mellitus, uncontrolled ([place]) 04/26/2018  . MGUS (monoclonal gammopathy of unknown significance) 02/05/2018  . Normocytic anemia 02/05/2018  . Thrombocytopenia ([place]) 02/05/2018  . Transient alteration of awareness 01/31/2017  . Chronic pain disorder 06/03/2016  . Diabetes mellitus type 2, controlled ([place]) 02/15/2014  . Neuropathy 10/19/2012  . Fibromyalgia 10/19/2012  .  Fibromyositis 10/19/2012  . Headache 10/19/2012   Past Medical History:  Diagnosis Date  . Arthritis    in her spine  . Diabetes mellitus   . Fibromyalgia   . Heart murmur   . Hx of colonic polyp - ssp 11/15/2018   10/2018 diminutive sessile serrated polyp recall 2027 Gatha Mayer, MD, [facility]   . Hypertension   . Kidney stone    kidney stone  . Migraine headache   . Nerve pain   . Rectocele 11/10/2018  . Stroke [facility])    TIA 20 -2000  . TIA (transient ischemic attack)   . Trigger finger of left hand    left long finger    Family History  Problem Relation Age of Onset  . Diabetes Mother   . Diabetes Father   . Cancer Brother   . Colon cancer Neg Hx   . Esophageal cancer Neg Hx   . Stomach cancer Neg Hx   . Rectal cancer Neg Hx     Past Surgical History:  Procedure Laterality Date  . ABDOMINAL HYSTERECTOMY  2012  . COLONOSCOPY  10/2018  . CYSTOSCOPY WITH RETROGRADE PYELOGRAM, URETEROSCOPY AND STENT PLACEMENT Right 05/29/2012   Procedure: CYSTOSCOPY WITH RETROGRADE PYELOGRAM, URETEROSCOPY AND STENT PLACEMENT;  Surgeon: Hanley Ben, MD;  [facility];  Service: Urology;  Laterality: Right;  . HOLMIUM LASER APPLICATION Right 4/85/4627   Procedure: HOLMIUM LASER APPLICATION;  Surgeon: Hanley Ben, MD;  Location[facility];  Service: Urology;  Laterality: Right;  . TRIGGER FINGER RELEASE Left 09/14/2019   Procedure: LEFT LONG FINGER A-1 PULLEY RELEASE;  Surgeon: Newt Minion, MD;  Location: [facility];  Service: Orthopedics;  Laterality: Left;   Social History   Occupational History  . Occupation: disability    Comment: finromyalgia. neuropathy  Tobacco Use  . Smoking status: Never Smoker  . Smokeless tobacco: Never Used  Vaping Use  . Vaping Use: Never used  Substance and Sexual Activity  . Alcohol use: No  . Drug use: No  . Sexual activity: Yes    Birth control/protection: Surgical

## 2019-12-31 ENCOUNTER — Encounter: Payer: Self-pay | Admitting: Internal Medicine

## 2019-12-31 ENCOUNTER — Ambulatory Visit (INDEPENDENT_AMBULATORY_CARE_PROVIDER_SITE_OTHER): Payer: Medicare Other | Admitting: Internal Medicine

## 2019-12-31 VITALS — BP 132/80 | HR 81 | Ht 60.0 in | Wt 112.6 lb

## 2019-12-31 DIAGNOSIS — M6208 Separation of muscle (nontraumatic), other site: Secondary | ICD-10-CM

## 2019-12-31 DIAGNOSIS — K439 Ventral hernia without obstruction or gangrene: Secondary | ICD-10-CM

## 2019-12-31 DIAGNOSIS — K581 Irritable bowel syndrome with constipation: Secondary | ICD-10-CM

## 2019-12-31 DIAGNOSIS — R140 Abdominal distension (gaseous): Secondary | ICD-10-CM

## 2019-12-31 NOTE — Progress Notes (Signed)
Nichole Garza 59 y.o. 1960/06/10 [id number]  Assessment & Plan:   Encounter Diagnoses  Name Primary?  . Ventral hernia without obstruction or gangrene Yes  . Diastasis recti   . Abdominal distention   . Irritable bowel syndrome with constipation     We will refer to Dr. Hassell Done of general surgery for his opinion on whether or not any type of surgical repair is appropriate.  I am not sure it is but she is bothered quite a bit so I think we need an expert opinion.  She may need physical therapy for diastases recti.  She has a background of fibromyalgia and IBS which are comorbidities which play a role.  Follow-up me as needed after that.  I am happy to refer her for physical therapy if necessary.   Primary care is now [facility] Dr. [place] Cerise.  I am unable to find this person in our database.    Subjective:   Chief Complaint: IBS, abdominal distention, hernia HPI The patient is here for follow-up, she has a history of irritable bowel syndrome treated with herbal laxatives at this point, abdominal distention with a very small fat-containing ventral hernia and diastases recti.  She has had EGD and colonoscopy in the last few years without any significant abnormalities that would cause symptoms.  I had referred her to general surgery for consideration of how much her ventral hernia or diastases recti would be causing symptoms and was she a candidate for repair.  She has had multiple deaths in the family including her mother dying while she was driving to see her in the middle of the Covid pandemic (did not die from Covid but the visitation restrictions in [place] were an issue) and it was quite a traumatic experience for her.  She has changed primary care from Dr. Vista Lawman to [facility] Dr. [place] Cerise Allergies  Allergen Reactions  . Enalapril Anaphylaxis  . Hydrocodone Other (See Comments)    Does not remember what happened-she woke up in her kitchen  . Latex  Swelling    Lips and face  . Relpax [Eletriptan] Other (See Comments)    "fainting"-knocked her out for the whole day   Current Meds  Medication Sig  . amLODipine (NORVASC) 5 MG tablet Take 5 mg by mouth daily.  Marland Kitchen aspirin EC 81 MG tablet Take 81 mg by mouth every morning.  . DULoxetine (CYMBALTA) 30 MG capsule Take 60 mg by mouth daily.   . fish oil-omega-3 fatty acids 1000 MG capsule Take 1 g by mouth 3 (three) times daily.   Marland Kitchen gabapentin (NEURONTIN) 800 MG tablet Take 800 mg by mouth 3 (three) times daily.  Marland Kitchen GINKGO BILOBA COMPLEX PO Take 1 capsule by mouth 2 (two) times a day.   Marland Kitchen glimepiride (AMARYL) 4 MG tablet Take 1 tablet by mouth 2 (two) times daily.  Marland Kitchen glucose blood (ONETOUCH VERIO) test strip USE 1 STRIP TO CHECK GLUCOSE TWICE DAILY  . metFORMIN (GLUCOPHAGE) 500 MG tablet Take 1,000 mg by mouth 2 (two) times daily with a meal.   . Multiple Vitamin (MULTIVITAMIN WITH MINERALS) TABS Take 1 tablet by mouth daily.  . pravastatin (PRAVACHOL) 10 MG tablet Take 10 mg by mouth at bedtime.  Marland Kitchen tiZANidine (ZANAFLEX) 4 MG capsule Take 4 mg by mouth daily. Take 2 tablets once daily  . vitamin C (ASCORBIC ACID) 500 MG tablet Take 500 mg by mouth daily.   Past Medical History:  Diagnosis Date  . Anxiety   .  Arthritis    in her spine  . Diabetes mellitus   . Fibromyalgia   . Heart murmur   . Hx of colonic polyp - ssp 11/15/2018   10/2018 diminutive sessile serrated polyp recall 2027 Gatha Mayer, MD, [facility]   . Hyperlipidemia   . Hypertension   . IBS (irritable bowel syndrome)   . Insomnia   . Kidney stone    kidney stone  . Migraine headache   . Nerve pain   . Rectocele 11/10/2018  . Stroke [facility])    TIA 20 -2000  . TIA (transient ischemic attack)   . Trigger finger of left hand    left long finger   Past Surgical History:  Procedure Laterality Date  . ABDOMINAL HYSTERECTOMY  2012  . COLONOSCOPY  10/2018  . CYSTOSCOPY WITH RETROGRADE PYELOGRAM, URETEROSCOPY AND STENT  PLACEMENT Right 05/29/2012   Procedure: CYSTOSCOPY WITH RETROGRADE PYELOGRAM, URETEROSCOPY AND STENT PLACEMENT;  Surgeon: Hanley Ben, MD;  [facility];  Service: Urology;  Laterality: Right;  . HOLMIUM LASER APPLICATION Right 8/46/6599   Procedure: HOLMIUM LASER APPLICATION;  Surgeon: Hanley Ben, MD;  [facility];  Service: Urology;  Laterality: Right;  . TRIGGER FINGER RELEASE Left 09/14/2019   Procedure: LEFT LONG FINGER A-1 PULLEY RELEASE;  Surgeon: Newt Minion, MD;  Location: [place];  Service: Orthopedics;  Laterality: Left;   Social History   Social History Narrative   Married.  Lives with husband.     Disabled CNA    grown daughter + stepdaughter/stepson   No EtOH, no smoking/tobacco/drugs   family history includes Cancer in her brother; Diabetes in her father and mother.   Review of Systems As above, she is complaining of scar tissue and pain and problems in the left hand after trigger finger release surgery in 2020 Objective:   Physical Exam BP 132/80   Pulse 81   Ht 5' (1.524 m)   Wt 112 lb 9.6 oz (51.1 kg)   SpO2 93%   BMI 21.99 kg/m  Thin black woman no acute distress The abdomen is protuberant, there is a very small ventral hernia in the midline above the umbilicus and diastases recti I believe nontender

## 2019-12-31 NOTE — Patient Instructions (Signed)
Normal BMI (Body Mass Index- based on height and weight) is between 19 and 25. Your BMI today is Body mass index is 21.99 kg/m. Marland Kitchen Please consider follow up  regarding your BMI with your Primary Care Provider.   You have been scheduled for an appointment with _________ at [facility]. Your appointment is on ____________ at ___________. Please arrive at _____________ for registration. Make certain to bring a list of current medications, including any over the counter medications or vitamins. Also bring your co-pay if you have one as well as your insurance cards. [facility] is located at [address], [address]. Should you need to reschedule your appointment, please contact them at [phone].   I appreciate the opportunity to care for you. Silvano Rusk, MD, [facility]

## 2020-01-05 ENCOUNTER — Other Ambulatory Visit: Payer: Self-pay

## 2020-01-05 ENCOUNTER — Encounter: Payer: Self-pay | Admitting: Occupational Therapy

## 2020-01-05 ENCOUNTER — Telehealth: Payer: Self-pay | Admitting: Internal Medicine

## 2020-01-05 ENCOUNTER — Ambulatory Visit: Payer: Medicare Other | Attending: Physician Assistant | Admitting: Occupational Therapy

## 2020-01-05 DIAGNOSIS — M79642 Pain in left hand: Secondary | ICD-10-CM | POA: Insufficient documentation

## 2020-01-05 DIAGNOSIS — R208 Other disturbances of skin sensation: Secondary | ICD-10-CM | POA: Insufficient documentation

## 2020-01-05 DIAGNOSIS — M25642 Stiffness of left hand, not elsewhere classified: Secondary | ICD-10-CM | POA: Insufficient documentation

## 2020-01-05 NOTE — Therapy (Signed)
[facility] [address] [place], [place], [address] Phone: [phone]   Fax:  [phone]  Occupational Therapy Treatment  Patient Details  Name: Nichole Garza MRN: [id number] Date of Birth: November 11, 1960 Referring Provider (OT): Bevely Palmer Persons   Encounter Date: 01/05/2020   OT End of Session - 01/05/20 1508    Visit Number 2    Number of Visits 9    Authorization Type UHC Medicare    Progress Note Due on Visit 10    OT Start Time 1315    OT Stop Time 1400    OT Time Calculation (min) 45 min    Activity Tolerance Patient tolerated treatment well    Behavior During Therapy Anxious           Past Medical History:  Diagnosis Date   Anxiety    Arthritis    in her spine   Diabetes mellitus    Fibromyalgia    Heart murmur    Hx of colonic polyp - ssp 11/15/2018   10/2018 diminutive sessile serrated polyp recall 2027 Gatha Mayer, MD, [facility]    Hyperlipidemia    Hypertension    IBS (irritable bowel syndrome)    Insomnia    Kidney stone    kidney stone   Migraine headache    Nerve pain    Rectocele 11/10/2018   Stroke ([place])    TIA 20 -2000   TIA (transient ischemic attack)    Trigger finger of left hand    left long finger    Past Surgical History:  Procedure Laterality Date   ABDOMINAL HYSTERECTOMY  2012   COLONOSCOPY  10/2018   CYSTOSCOPY WITH RETROGRADE PYELOGRAM, URETEROSCOPY AND STENT PLACEMENT Right 05/29/2012   Procedure: CYSTOSCOPY WITH RETROGRADE PYELOGRAM, URETEROSCOPY AND STENT PLACEMENT;  Surgeon: Hanley Ben, MD;  [facility];  Service: Urology;  Laterality: Right;   HOLMIUM LASER APPLICATION Right 07/18/6220   Procedure: HOLMIUM LASER APPLICATION;  Surgeon: Hanley Ben, MD;  [facility];  Service: Urology;  Laterality: Right;   TRIGGER FINGER RELEASE Left 09/14/2019   Procedure: LEFT LONG FINGER A-1 PULLEY RELEASE;  Surgeon: Newt Minion, MD;   Location: [place];  Service: Orthopedics;  Laterality: Left;    There were no vitals filed for this visit.                 OT Treatments/Exercises (OP) - 01/05/20 0001      ADLs   ADL Comments Discussed progress with long finger - now triggering with ring finger.  Discussed seeing hand specialist to address new trigger.        Ultrasound   Ultrasound Location left palm    Ultrasound Parameters 0.70mhz, continuous, 0.8w/cm2 x 5 min    Ultrasound Goals Pain;Edema      LUE Fluidotherapy   Number Minutes Fluidotherapy 10 Minutes    LUE Fluidotherapy Location Hand;Wrist    Comments pain relief prior to stretch      Splinting   Splinting Oval 8 splint issued for left ring finger to decrease extreme flexion and prevent triggering      Manual Therapy   Manual Therapy Soft tissue mobilization;Edema management;Passive ROM    Manual therapy comments scar management, desensitization                  OT Education - 01/05/20 1507    Education Details composite flexion and extension, hand desensitization, palm massage    Person(s) Educated  Patient    Methods Explanation;Demonstration;Tactile cues;Verbal cues    Comprehension Verbalized understanding;Returned demonstration            OT Short Term Goals - 01/05/20 1510      OT SHORT TERM GOAL #1   Title Patient will complete HEP independently due 01/28/20    Time 4    Period Weeks    Status On-going    Target Date 01/28/20      OT SHORT TERM GOAL #2   Title Patient will demonstrate 3lb increase in left grip strength    Time 4    Period Weeks    Status On-going      OT SHORT TERM GOAL #3   Title Patient will report pain no greater than 4/10 during desensitization techniques    Baseline 9/10    Time 4    Period Weeks    Status On-going             OT Long Term Goals - 01/05/20 1511      OT LONG TERM GOAL #1   Title Patient will complete desensitization, massage, and upgraded  HEP I'ly due 02/27/20    Time 8    Period Weeks    Status On-going      OT LONG TERM GOAL #2   Title Patient will report ability to roll dumplings using BUE    Time 8    Period Weeks    Status On-going      OT LONG TERM GOAL #3   Title Patient will demonstrate 8lb increase in left grip strength    Time 8    Period Weeks    Status On-going      OT LONG TERM GOAL #4   Title Patient will demonstrate 90% composite flexion and extension in LUE    Time 8    Period Weeks    Status On-going                 Plan - 01/05/20 1508    Clinical Impression Statement Patient frustrated by lack of progress following surgery.  Reinforced that long finger with improved movement, but now ring finger actively triggering.  Patient encouraged to use medication and breathing to allow her to relax during OT treatment.    OT Occupational Profile and History Problem Focused Assessment - Including review of records relating to presenting problem    Occupational performance deficits (Please refer to evaluation for details): ADL's;IADL's    Body Structure / Function / Physical Skills ADL;Coordination;Scar mobility;UE functional use;Sensation;Fascial restriction;IADL;Pain;Skin integrity;Wound;Strength;FMC;Dexterity;Edema;ROM    Rehab Potential Good    Clinical Decision Making Limited treatment options, no task modification necessary    Comorbidities Affecting Occupational Performance: May have comorbidities impacting occupational performance    Modification or Assistance to Complete Evaluation  No modification of tasks or assist necessary to complete eval    OT Frequency 1x / week    OT Duration 8 weeks    OT Treatment/Interventions Self-care/ADL training;Iontophoresis;Therapeutic exercise;Patient/family education;Splinting;Moist Heat;Fluidtherapy;Scar mobilization;Therapeutic activities;Passive range of motion;Manual Therapy;DME and/or AE instruction;Contrast Bath;Ultrasound    Plan Fluidotherapy,  massage/ palm, desensitization, Ultrasound, gentle exercise    OT Home Exercise Plan composite flex/ext left UE    Consulted and Agree with Plan of Care Patient           Patient will benefit from skilled therapeutic intervention in order to improve the following deficits and impairments:   Body Structure / Function / Physical Skills: ADL, Coordination, Scar mobility, UE  functional use, Sensation, Fascial restriction, IADL, Pain, Skin integrity, Wound, Strength, FMC, Dexterity, Edema, ROM       Visit Diagnosis: Pain in left hand  Stiffness of left hand, not elsewhere classified  Other disturbances of skin sensation    Problem List Patient Active Problem List   Diagnosis Date Noted   Diastasis recti 12/31/2019   Irritable bowel syndrome with constipation 12/31/2019   Trigger middle finger of left hand    Grief reaction 01/23/2019   Ventral hernia without obstruction or gangrene 01/23/2019   Abnormal CT scan, stomach 01/23/2019   Abdominal distention 01/23/2019   Anxiety about health 12/15/2018   Hx of colonic polyp - ssp 11/15/2018   Rectocele 11/10/2018   Carpal tunnel syndrome of right wrist 07/28/2018   Pain of right upper extremity 06/16/2018   Menopausal syndrome (hot flushes) 05/12/2018   Type II diabetes mellitus, uncontrolled ([place]) 04/26/2018   MGUS (monoclonal gammopathy of unknown significance) 02/05/2018   Normocytic anemia 02/05/2018   Thrombocytopenia ([place]) 02/05/2018   Transient alteration of awareness 01/31/2017   Chronic pain disorder 06/03/2016   Diabetes mellitus type 2, controlled ([place]) 02/15/2014   Neuropathy 10/19/2012   Fibromyalgia 10/19/2012   Fibromyositis 10/19/2012   Headache 10/19/2012    Mariah Milling, OTR/L 01/05/2020, 3:12 PM  [facility] [address] [place], [place], [address] Phone: [phone]   Fax:  [phone]  Name: Sarahanne Novakowski MRN: [id number] Date of Birth: 1960-05-31

## 2020-01-05 NOTE — Telephone Encounter (Signed)
Nichole Garza from [facility] called and stated they can only do the hernia portion of the referral and that the she should be referred to plastic. Please advise for she is not sure if Dr. Carlean Purl is ok with two referral or just redirect referral to plastics

## 2020-01-10 NOTE — Telephone Encounter (Signed)
[facility] notified needs eval for ventral hernia.  They will contact the patient and get her scheduled

## 2020-01-11 ENCOUNTER — Ambulatory Visit: Payer: Medicare Other | Admitting: Occupational Therapy

## 2020-01-11 ENCOUNTER — Encounter: Payer: Self-pay | Admitting: Occupational Therapy

## 2020-01-11 ENCOUNTER — Other Ambulatory Visit: Payer: Self-pay

## 2020-01-11 DIAGNOSIS — M25642 Stiffness of left hand, not elsewhere classified: Secondary | ICD-10-CM | POA: Diagnosis not present

## 2020-01-11 DIAGNOSIS — R208 Other disturbances of skin sensation: Secondary | ICD-10-CM

## 2020-01-11 DIAGNOSIS — M79642 Pain in left hand: Secondary | ICD-10-CM | POA: Diagnosis not present

## 2020-01-11 NOTE — Therapy (Signed)
[facility] [address] [place], [place], [address] Phone: [phone]   Fax:  [phone]  Occupational Therapy Treatment  Patient Details  Name: Nichole Garza MRN: [id number] Date of Birth: [birth year] Referring Provider (OT): Bevely Palmer Persons   Encounter Date: 01/11/2020   OT End of Session - 01/11/20 1154    Visit Number 3    Number of Visits 9    Authorization Type UHC Medicare    Progress Note Due on Visit 10    OT Start Time 1148    OT Stop Time 1230    OT Time Calculation (min) 42 min    Activity Tolerance Patient tolerated treatment well    Behavior During Therapy Anxious           Past Medical History:  Diagnosis Date  . Anxiety   . Arthritis    in her spine  . Diabetes mellitus   . Fibromyalgia   . Heart murmur   . Hx of colonic polyp - ssp 11/15/2018   10/2018 diminutive sessile serrated polyp recall 2027 Gatha Mayer, MD, [facility]   . Hyperlipidemia   . Hypertension   . IBS (irritable bowel syndrome)   . Insomnia   . Kidney stone    kidney stone  . Migraine headache   . Nerve pain   . Rectocele 11/10/2018  . Stroke [facility])    TIA 20 -2000  . TIA (transient ischemic attack)   . Trigger finger of left hand    left long finger    Past Surgical History:  Procedure Laterality Date  . ABDOMINAL HYSTERECTOMY  2012  . COLONOSCOPY  10/2018  . CYSTOSCOPY WITH RETROGRADE PYELOGRAM, URETEROSCOPY AND STENT PLACEMENT Right 05/29/2012   Procedure: CYSTOSCOPY WITH RETROGRADE PYELOGRAM, URETEROSCOPY AND STENT PLACEMENT;  Surgeon: Hanley Ben, MD;  [facility];  Service: Urology;  Laterality: Right;  . HOLMIUM LASER APPLICATION Right 6/76/1950   Procedure: HOLMIUM LASER APPLICATION;  Surgeon: Hanley Ben, MD;  [facility];  Service: Urology;  Laterality: Right;  . TRIGGER FINGER RELEASE Left 09/14/2019   Procedure: LEFT LONG FINGER A-1 PULLEY RELEASE;  Surgeon: Newt Minion,  MD;  Location: [place];  Service: Orthopedics;  Laterality: Left;    There were no vitals filed for this visit.   Subjective Assessment - 01/11/20 1202    Subjective  "I don't think my surgery was good"    Currently in Pain? Yes    Pain Score 5     Pain Location Hand   palm   Pain Orientation Left    Pain Descriptors / Indicators Tender    Pain Type Acute pain    Pain Onset More than a month ago    Pain Frequency Constant                        OT Treatments/Exercises (OP) - 01/11/20 1156      Hand Exercises   Other Hand Exercises LUE composite flexion/extension x 10 x 2    Other Hand Exercises tendon glides      LUE Fluidotherapy   Number Minutes Fluidotherapy 10 Minutes    LUE Fluidotherapy Location Hand;Wrist    Comments pain relief prior to STM. pt reports pain relief      Splinting   Splinting Reissued oval 8 splint secondary to patient losing but pt reported relief with use of splint size 6      Manual Therapy  Manual Therapy Edema management;Soft tissue mobilization;Passive ROM    Manual therapy comments scar tissue massage              OT Education - 01/11/20 1201    Education Details reissued oval 8 splint. isueed tendon glides    Person(s) Educated Patient    Methods Explanation;Demonstration    Comprehension Verbalized understanding;Returned demonstration;Need further instruction            OT Short Term Goals - 01/05/20 1510      OT SHORT TERM GOAL #1   Title Patient will complete HEP independently due 01/28/20    Time 4    Period Weeks    Status On-going    Target Date 01/28/20      OT SHORT TERM GOAL #2   Title Patient will demonstrate 3lb increase in left grip strength    Time 4    Period Weeks    Status On-going      OT SHORT TERM GOAL #3   Title Patient will report pain no greater than 4/10 during desensitization techniques    Baseline 9/10    Time 4    Period Weeks    Status On-going               OT Long Term Goals - 01/05/20 1511      OT LONG TERM GOAL #1   Title Patient will complete desensitization, massage, and upgraded HEP I'ly due 02/27/20    Time 8    Period Weeks    Status On-going      OT LONG TERM GOAL #2   Title Patient will report ability to roll dumplings using BUE    Time 8    Period Weeks    Status On-going      OT LONG TERM GOAL #3   Title Patient will demonstrate 8lb increase in left grip strength    Time 8    Period Weeks    Status On-going      OT LONG TERM GOAL #4   Title Patient will demonstrate 90% composite flexion and extension in LUE    Time 8    Period Weeks    Status On-going                 Plan - 01/11/20 1202    Clinical Impression Statement Patient with success with oval 8 splint and requested another secondary to losing the one issued last week. Pt is continuing to experience a lot of pain in left palm. Continuing to encourage to follow up with hand specialist d/t ring finger trigger that has developed.    OT Occupational Profile and History Problem Focused Assessment - Including review of records relating to presenting problem    Occupational performance deficits (Please refer to evaluation for details): ADL's;IADL's    Body Structure / Function / Physical Skills ADL;Coordination;Scar mobility;UE functional use;Sensation;Fascial restriction;IADL;Pain;Skin integrity;Wound;Strength;FMC;Dexterity;Edema;ROM    Rehab Potential Good    Clinical Decision Making Limited treatment options, no task modification necessary    Comorbidities Affecting Occupational Performance: May have comorbidities impacting occupational performance    Modification or Assistance to Complete Evaluation  No modification of tasks or assist necessary to complete eval    OT Frequency 1x / week    OT Duration 8 weeks    OT Treatment/Interventions Self-care/ADL training;Iontophoresis;Therapeutic exercise;Patient/family education;Splinting;Moist  Heat;Fluidtherapy;Scar mobilization;Therapeutic activities;Passive range of motion;Manual Therapy;DME and/or AE instruction;Contrast Bath;Ultrasound    Plan Fluidotherapy, massage/ palm, desensitization, Ultrasound, gentle exercise  OT Home Exercise Plan composite flex/ext left UE    Consulted and Agree with Plan of Care Patient           Patient will benefit from skilled therapeutic intervention in order to improve the following deficits and impairments:   Body Structure / Function / Physical Skills: ADL, Coordination, Scar mobility, UE functional use, Sensation, Fascial restriction, IADL, Pain, Skin integrity, Wound, Strength, FMC, Dexterity, Edema, ROM       Visit Diagnosis: Pain in left hand  Stiffness of left hand, not elsewhere classified  Other disturbances of skin sensation    Problem List Patient Active Problem List   Diagnosis Date Noted  . Diastasis recti 12/31/2019  . Irritable bowel syndrome with constipation 12/31/2019  . Trigger middle finger of left hand   . Grief reaction 01/23/2019  . Ventral hernia without obstruction or gangrene 01/23/2019  . Abnormal CT scan, stomach 01/23/2019  . Abdominal distention 01/23/2019  . Anxiety about health 12/15/2018  . Hx of colonic polyp - ssp 11/15/2018  . Rectocele 11/10/2018  . Carpal tunnel syndrome of right wrist 07/28/2018  . Pain of right upper extremity 06/16/2018  . Menopausal syndrome (hot flushes) 05/12/2018  . Type II diabetes mellitus, uncontrolled ([place]) 04/26/2018  . MGUS (monoclonal gammopathy of unknown significance) 02/05/2018  . Normocytic anemia 02/05/2018  . Thrombocytopenia ([place]) 02/05/2018  . Transient alteration of awareness 01/31/2017  . Chronic pain disorder 06/03/2016  . Diabetes mellitus type 2, controlled ([place]) 02/15/2014  . Neuropathy 10/19/2012  . Fibromyalgia 10/19/2012  . Fibromyositis 10/19/2012  . Headache 10/19/2012    Zachery Conch MOT, OTR/L  01/11/2020, 2:43  PM  [facility] [address] [place], [place], [address] Phone: [phone]   Fax:  [phone]  Name: Nichole [place]za MRN: [id number] Date of Birth: 07-Jun-1960

## 2020-01-11 NOTE — Patient Instructions (Signed)
Flexor Tendon Gliding (Active Hook Fist)   With fingers and knuckles straight, bend middle and tip joints. Do not bend large knuckles. Repeat _10-15___ times. Do _4-6___ sessions per day.  MP Flexion (Active)   With back of hand on table, bend large knuckles as far as they will go, keeping small joints straight. Repeat _10-15___ times. Do __4-6__ sessions per day. Activity: Reach into a narrow container.*      Finger Flexion / Extension   With palm up, bend fingers of left hand toward palm, making a  fist. Straighten fingers, opening fist. Repeat sequence _10-15___ times per session. Do _4-6__ sessions per day. Hand Variation: Palm down   Copyright  VHI. All rights reserved.   

## 2020-01-12 ENCOUNTER — Telehealth: Payer: Self-pay | Admitting: Internal Medicine

## 2020-01-12 NOTE — Telephone Encounter (Signed)
Patient informed and said thank you. 

## 2020-01-12 NOTE — Telephone Encounter (Signed)
Dr.'s name please?

## 2020-01-12 NOTE — Telephone Encounter (Signed)
[phone]  [facility] the number above

## 2020-01-17 ENCOUNTER — Ambulatory Visit (INDEPENDENT_AMBULATORY_CARE_PROVIDER_SITE_OTHER): Payer: Medicare Other | Admitting: Orthopedic Surgery

## 2020-01-17 ENCOUNTER — Encounter: Payer: Self-pay | Admitting: Orthopedic Surgery

## 2020-01-17 DIAGNOSIS — M65332 Trigger finger, left middle finger: Secondary | ICD-10-CM

## 2020-01-17 NOTE — Progress Notes (Signed)
Office Visit Note   Patient: Nichole Garza           Date of Birth: Jun 04, 1960           MRN: [id number] Visit Date: 01/17/2020              Requested by: Benito Mccreedy, MD [address] [place],  [place] [address] PCP: No primary care provider on file.  No chief complaint on file.     HPI: Patient is 4 months status post release A1 pulley left long finger patient states she does have global pain in her hand with burning complains of pain over the scar.  Patient states she is on maximum dose of Neurontin and Cymbalta.  She is started going to occupational therapy.  Assessment & Plan: Visit Diagnoses:  1. Trigger finger, left middle finger     Plan: Continue with occupational therapy patient was given instructions to get a paraffin bath at [facility] for deep heat for her hand.  Discussed the importance of passively moving all the fingers.  Discussed the importance of scar massage recommended she continue with her Voltaren gel.  Follow-Up Instructions: Return in about 4 weeks (around 02/14/2020).   Ortho Exam  Patient is alert, oriented, no adenopathy, well-dressed, normal affect, normal respiratory effort. Examination patient has hypersensitivity to touch globally around her hand she has active range of motion of all fingers there is no triggering of the long finger she has some mild keloiding of the scar and the importance of scar massage was again reviewed.  We reviewed the importance of passive range of motion of all fingers.  Imaging: No results found. No images are attached to the encounter.  Labs: Lab Results  Component Value Date   HGBA1C 7.9 (H) 02/14/2014   HGBA1C 6.6 (H) 10/19/2012   ESRSEDRATE 22 10/20/2012   REPTSTATUS 02/21/2014 FINAL 02/14/2014   CULT  02/14/2014    NO GROWTH 5 DAYS Performed at [facility]      Lab Results  Component Value Date   ALBUMIN 4.6 11/26/2019   ALBUMIN 4.6 11/26/2018   ALBUMIN 4.9 05/26/2018     No results found for: MG Lab Results  Component Value Date   VD25OH 52.9 02/05/2018    No results found for: PREALBUMIN CBC EXTENDED Latest Ref Rng & Units 11/26/2019 11/26/2018 05/26/2018  WBC 4.0 - 10.5 K/uL 5.5 6.1 10.8(H)  RBC 3.87 - 5.11 MIL/uL 4.47 4.48 4.59  HGB 12.0 - 15.0 g/dL 12.3 12.5 12.6  HCT 36 - 46 % 38.2 39.5 40.1  PLT 150 - 400 K/uL 306 314 304  NEUTROABS 1.7 - 7.7 K/uL 2.4 2.8 5.8  LYMPHSABS 0.7 - 4.0 K/uL 2.7 2.9 4.3(H)     There is no height or weight on file to calculate BMI.  Orders:  No orders of the defined types were placed in this encounter.  No orders of the defined types were placed in this encounter.    Procedures: No procedures performed  Clinical Data: No additional findings.  ROS:  All other systems negative, except as noted in the HPI. Review of Systems  Objective: Vital Signs: There were no vitals taken for this visit.  Specialty Comments:  No specialty comments available.  PMFS History: Patient Active Problem List   Diagnosis Date Noted  . Diastasis recti 12/31/2019  . Irritable bowel syndrome with constipation 12/31/2019  . Trigger middle finger of left hand   . Grief reaction 01/23/2019  . Ventral  hernia without obstruction or gangrene 01/23/2019  . Abnormal CT scan, stomach 01/23/2019  . Abdominal distention 01/23/2019  . Anxiety about health 12/15/2018  . Hx of colonic polyp - ssp 11/15/2018  . Rectocele 11/10/2018  . Carpal tunnel syndrome of right wrist 07/28/2018  . Pain of right upper extremity 06/16/2018  . Menopausal syndrome (hot flushes) 05/12/2018  . Type II diabetes mellitus, uncontrolled ([place]) 04/26/2018  . MGUS (monoclonal gammopathy of unknown significance) 02/05/2018  . Normocytic anemia 02/05/2018  . Thrombocytopenia ([place]) 02/05/2018  . Transient alteration of awareness 01/31/2017  . Chronic pain disorder 06/03/2016  . Diabetes mellitus type 2, controlled ([place]) 02/15/2014  . Neuropathy  10/19/2012  . Fibromyalgia 10/19/2012  . Fibromyositis 10/19/2012  . Headache 10/19/2012   Past Medical History:  Diagnosis Date  . Anxiety   . Arthritis    in her spine  . Diabetes mellitus   . Fibromyalgia   . Heart murmur   . Hx of colonic polyp - ssp 11/15/2018   10/2018 diminutive sessile serrated polyp recall 2027 Gatha Mayer, MD, [facility]   . Hyperlipidemia   . Hypertension   . IBS (irritable bowel syndrome)   . Insomnia   . Kidney stone    kidney stone  . Migraine headache   . Nerve pain   . Rectocele 11/10/2018  . Stroke [facility])    TIA 20 -2000  . TIA (transient ischemic attack)   . Trigger finger of left hand    left long finger    Family History  Problem Relation Age of Onset  . Diabetes Mother   . Diabetes Father   . Cancer Brother   . Colon cancer Neg Hx   . Esophageal cancer Neg Hx   . Stomach cancer Neg Hx   . Rectal cancer Neg Hx     Past Surgical History:  Procedure Laterality Date  . ABDOMINAL HYSTERECTOMY  2012  . COLONOSCOPY  10/2018  . CYSTOSCOPY WITH RETROGRADE PYELOGRAM, URETEROSCOPY AND STENT PLACEMENT Right 05/29/2012   Procedure: CYSTOSCOPY WITH RETROGRADE PYELOGRAM, URETEROSCOPY AND STENT PLACEMENT;  Surgeon: Hanley Ben, MD;  Location: [facility];  Service: Urology;  Laterality: Right;  . HOLMIUM LASER APPLICATION Right 0/27/2536   Procedure: HOLMIUM LASER APPLICATION;  Surgeon: Hanley Ben, MD;  [facility];  Service: Urology;  Laterality: Right;  . TRIGGER FINGER RELEASE Left 09/14/2019   Procedure: LEFT LONG FINGER A-1 PULLEY RELEASE;  Surgeon: Newt Minion, MD;  Location: [facility];  Service: Orthopedics;  Laterality: Left;   Social History   Occupational History  . Occupation: disability    Comment: finromyalgia. neuropathy  Tobacco Use  . Smoking status: Never Smoker  . Smokeless tobacco: Never Used  Vaping Use  . Vaping Use: Never used  Substance and Sexual Activity  . Alcohol use: No  . Drug use: No   . Sexual activity: Yes    Birth control/protection: Surgical

## 2020-01-18 ENCOUNTER — Encounter: Payer: Self-pay | Admitting: Occupational Therapy

## 2020-01-18 ENCOUNTER — Other Ambulatory Visit: Payer: Self-pay

## 2020-01-18 ENCOUNTER — Ambulatory Visit: Payer: Medicare Other | Admitting: Occupational Therapy

## 2020-01-18 DIAGNOSIS — M79642 Pain in left hand: Secondary | ICD-10-CM | POA: Diagnosis not present

## 2020-01-18 DIAGNOSIS — R208 Other disturbances of skin sensation: Secondary | ICD-10-CM | POA: Diagnosis not present

## 2020-01-18 DIAGNOSIS — M25642 Stiffness of left hand, not elsewhere classified: Secondary | ICD-10-CM | POA: Diagnosis not present

## 2020-01-18 NOTE — Therapy (Signed)
[facility] [address] [place], [place], [address] Phone: [phone]   Fax:  [phone]  Occupational Therapy Treatment  Patient Details  Name: Nichole Garza MRN: [id number] Date of Birth: Aug 13, 1960 Referring Provider (OT): Bevely Palmer Persons   Encounter Date: 01/18/2020   OT End of Session - 01/18/20 1155    Visit Number 4    Number of Visits 9    Authorization Type UHC Medicare    Progress Note Due on Visit 10    OT Start Time 1151    OT Stop Time 1230    OT Time Calculation (min) 39 min    Activity Tolerance Patient tolerated treatment well    Behavior During Therapy Anxious           Past Medical History:  Diagnosis Date  . Anxiety   . Arthritis    in her spine  . Diabetes mellitus   . Fibromyalgia   . Heart murmur   . Hx of colonic polyp - ssp 11/15/2018   10/2018 diminutive sessile serrated polyp recall 2027 Gatha Mayer, MD, [facility]   . Hyperlipidemia   . Hypertension   . IBS (irritable bowel syndrome)   . Insomnia   . Kidney stone    kidney stone  . Migraine headache   . Nerve pain   . Rectocele 11/10/2018  . Stroke [facility])    TIA 20 -2000  . TIA (transient ischemic attack)   . Trigger finger of left hand    left long finger    Past Surgical History:  Procedure Laterality Date  . ABDOMINAL HYSTERECTOMY  2012  . COLONOSCOPY  10/2018  . CYSTOSCOPY WITH RETROGRADE PYELOGRAM, URETEROSCOPY AND STENT PLACEMENT Right 05/29/2012   Procedure: CYSTOSCOPY WITH RETROGRADE PYELOGRAM, URETEROSCOPY AND STENT PLACEMENT;  Surgeon: Hanley Ben, MD;  [facility];  Service: Urology;  Laterality: Right;  . HOLMIUM LASER APPLICATION Right 12/21/1939   Procedure: HOLMIUM LASER APPLICATION;  Surgeon: Hanley Ben, MD;  [facility];  Service: Urology;  Laterality: Right;  . TRIGGER FINGER RELEASE Left 09/14/2019   Procedure: LEFT LONG FINGER A-1 PULLEY RELEASE;  Surgeon: Newt Minion,  MD;  Location: [facility];  Service: Orthopedics;  Laterality: Left;    There were no vitals filed for this visit.   Subjective Assessment - 01/18/20 1209    Subjective  Pt reporting pain 8/10 in LUE at site of surgery.    Currently in Pain? Yes    Pain Score 8     Pain Location Hand    Pain Orientation Left    Pain Descriptors / Indicators Shooting    Pain Type Surgical pain    Pain Onset More than a month ago    Pain Frequency Constant                        OT Treatments/Exercises (OP) - 01/18/20 1203      Hand Exercises   Other Hand Exercises LUE composite flexion/extension x 10 - continuing to experience triggering in 4th digit LUE so stopped flexion     Other Hand Exercises tendon glides      Modalities   Modalities Paraffin      LUE Paraffin   Number Minutes Paraffin 10 Minutes    LUE Paraffin Location Hand    Type Other    Comments Pt's doctor rx paraffin OTC       Manual Therapy   Manual  Therapy Edema management;Soft tissue mobilization;Passive ROM    Manual therapy comments scar management, scar tissue massage, edeme management, mobility/ROM                  OT Education - 01/18/20 1207    Education Details Education provided on importance of HEP exercises for LUE and scar tissue massage.    Person(s) Educated Patient    Methods Explanation;Demonstration    Comprehension Verbalized understanding;Need further instruction;Returned demonstration            OT Short Term Goals - 01/05/20 1510      OT SHORT TERM GOAL #1   Title Patient will complete HEP independently due 01/28/20    Time 4    Period Weeks    Status On-going    [facility] Date 01/28/20      OT SHORT TERM GOAL #2   Title Patient will demonstrate 3lb increase in left grip strength    Time 4    Period Weeks    Status On-going      OT SHORT TERM GOAL #3   Title Patient will report pain no greater than 4/10 during desensitization techniques    Baseline  9/10    Time 4    Period Weeks    Status On-going             OT Long Term Goals - 01/05/20 1511      OT LONG TERM GOAL #1   Title Patient will complete desensitization, massage, and upgraded HEP I'ly due 02/27/20    Time 8    Period Weeks    Status On-going      OT LONG TERM GOAL #2   Title Patient will report ability to roll dumplings using BUE    Time 8    Period Weeks    Status On-going      OT LONG TERM GOAL #3   Title Patient will demonstrate 8lb increase in left grip strength    Time 8    Period Weeks    Status On-going      OT LONG TERM GOAL #4   Title Patient will demonstrate 90% composite flexion and extension in LUE    Time 8    Period Weeks    Status On-going                 Plan - 01/18/20 1244    Clinical Impression Statement Patient continues to experience extreme pain in LUE and increased anxiety. Breathing techniques discussed with patient for pain management.    OT Occupational Profile and History Problem Focused Assessment - Including review of records relating to presenting problem    Occupational performance deficits (Please refer to evaluation for details): ADL's;IADL's    Body Structure / Function / Physical Skills ADL;Coordination;Scar mobility;UE functional use;Sensation;Fascial restriction;IADL;Pain;Skin integrity;Wound;Strength;FMC;Dexterity;Edema;ROM    Rehab Potential Good    Clinical Decision Making Limited treatment options, no task modification necessary    Comorbidities Affecting Occupational Performance: May have comorbidities impacting occupational performance    Modification or Assistance to Complete Evaluation  No modification of tasks or assist necessary to complete eval    OT Frequency 1x / week    OT Duration 8 weeks    OT Treatment/Interventions Self-care/ADL training;Iontophoresis;Therapeutic exercise;Patient/family education;Splinting;Moist Heat;Fluidtherapy;Scar mobilization;Therapeutic activities;Passive range of  motion;Manual Therapy;DME and/or AE instruction;Contrast Bath;Ultrasound    Plan Fluidotherapy, massage/ palm, desensitization, Ultrasound, gentle exercise    OT Home Exercise Plan composite flex/ext left UE    Consulted and  Agree with Plan of Care Patient           Patient will benefit from skilled therapeutic intervention in order to improve the following deficits and impairments:   Body Structure / Function / Physical Skills: ADL, Coordination, Scar mobility, UE functional use, Sensation, Fascial restriction, IADL, Pain, Skin integrity, Wound, Strength, FMC, Dexterity, Edema, ROM       Visit Diagnosis: Pain in left hand  Stiffness of left hand, not elsewhere classified  Other disturbances of skin sensation    Problem List Patient Active Problem List   Diagnosis Date Noted  . Diastasis recti 12/31/2019  . Irritable bowel syndrome with constipation 12/31/2019  . Trigger middle finger of left hand   . Grief reaction 01/23/2019  . Ventral hernia without obstruction or gangrene 01/23/2019  . Abnormal CT scan, stomach 01/23/2019  . Abdominal distention 01/23/2019  . Anxiety about health 12/15/2018  . Hx of colonic polyp - ssp 11/15/2018  . Rectocele 11/10/2018  . Carpal tunnel syndrome of right wrist 07/28/2018  . Pain of right upper extremity 06/16/2018  . Menopausal syndrome (hot flushes) 05/12/2018  . Type II diabetes mellitus, uncontrolled ([place]) 04/26/2018  . MGUS (monoclonal gammopathy of unknown significance) 02/05/2018  . Normocytic anemia 02/05/2018  . Thrombocytopenia ([place]) 02/05/2018  . Transient alteration of awareness 01/31/2017  . Chronic pain disorder 06/03/2016  . Diabetes mellitus type 2, controlled ([place]) 02/15/2014  . Neuropathy 10/19/2012  . Fibromyalgia 10/19/2012  . Fibromyositis 10/19/2012  . Headache 10/19/2012    Zachery Conch MOT, OTR/L  01/18/2020, 12:47 PM  [facility] [address] [place], [place], [address] Phone: [phone]   Fax:  [phone]  Name: Nichole Garza MRN: [id number] Date of Birth: September 08, 1960

## 2020-01-19 DIAGNOSIS — R140 Abdominal distension (gaseous): Secondary | ICD-10-CM | POA: Diagnosis not present

## 2020-01-19 NOTE — Telephone Encounter (Signed)
I left a message for the patient that we are referring her for ventral hernia .  Not diastasis recti.

## 2020-01-19 NOTE — Telephone Encounter (Signed)
Nichole Garza with [facility] called inquiring if this pt was referred to a Psychiatric nurse. Pls call her at [phone] or message her through [place]. She said that she can see messages in Epic.

## 2020-01-20 DIAGNOSIS — M65332 Trigger finger, left middle finger: Secondary | ICD-10-CM | POA: Diagnosis not present

## 2020-01-20 DIAGNOSIS — M65342 Trigger finger, left ring finger: Secondary | ICD-10-CM | POA: Diagnosis not present

## 2020-01-25 ENCOUNTER — Ambulatory Visit: Payer: Medicare Other | Admitting: Occupational Therapy

## 2020-01-26 DIAGNOSIS — E1122 Type 2 diabetes mellitus with diabetic chronic kidney disease: Secondary | ICD-10-CM | POA: Diagnosis not present

## 2020-01-26 DIAGNOSIS — N183 Chronic kidney disease, stage 3 unspecified: Secondary | ICD-10-CM | POA: Diagnosis not present

## 2020-01-26 DIAGNOSIS — N2581 Secondary hyperparathyroidism of renal origin: Secondary | ICD-10-CM | POA: Diagnosis not present

## 2020-01-26 DIAGNOSIS — I129 Hypertensive chronic kidney disease with stage 1 through stage 4 chronic kidney disease, or unspecified chronic kidney disease: Secondary | ICD-10-CM | POA: Diagnosis not present

## 2020-01-26 DIAGNOSIS — D649 Anemia, unspecified: Secondary | ICD-10-CM | POA: Diagnosis not present

## 2020-01-27 ENCOUNTER — Other Ambulatory Visit: Payer: Self-pay | Admitting: Nephrology

## 2020-01-27 DIAGNOSIS — N183 Chronic kidney disease, stage 3 unspecified: Secondary | ICD-10-CM

## 2020-02-02 ENCOUNTER — Other Ambulatory Visit: Payer: Self-pay

## 2020-02-02 ENCOUNTER — Ambulatory Visit: Payer: Medicare Other | Attending: Physician Assistant | Admitting: Occupational Therapy

## 2020-02-02 ENCOUNTER — Encounter: Payer: Self-pay | Admitting: Occupational Therapy

## 2020-02-02 DIAGNOSIS — M79642 Pain in left hand: Secondary | ICD-10-CM | POA: Diagnosis present

## 2020-02-02 DIAGNOSIS — M25642 Stiffness of left hand, not elsewhere classified: Secondary | ICD-10-CM

## 2020-02-02 DIAGNOSIS — R208 Other disturbances of skin sensation: Secondary | ICD-10-CM | POA: Diagnosis present

## 2020-02-02 NOTE — Therapy (Signed)
[facility] [address] [place], [place], [address] Phone: [phone]   Fax:  [phone]  Occupational Therapy Treatment  Patient Details  Name: Nichole Garza MRN: [id number] Date of Birth: [birth year] Referring Provider (OT): Bevely Palmer Persons   Encounter Date: 02/02/2020   OT End of Session - 02/02/20 1238    Visit Number 5    Number of Visits 9    Authorization Type UHC Medicare    Progress Note Due on Visit 10    OT Start Time 1145    OT Stop Time 1230    OT Time Calculation (min) 45 min    Activity Tolerance Patient tolerated treatment well    Behavior During Therapy Anxious           Past Medical History:  Diagnosis Date  . Anxiety   . Arthritis    in her spine  . Diabetes mellitus   . Fibromyalgia   . Heart murmur   . Hx of colonic polyp - ssp 11/15/2018   10/2018 diminutive sessile serrated polyp recall 2027 Gatha Mayer, MD, [facility]   . Hyperlipidemia   . Hypertension   . IBS (irritable bowel syndrome)   . Insomnia   . Kidney stone    kidney stone  . Migraine headache   . Nerve pain   . Rectocele 11/10/2018  . Stroke [facility])    TIA 20 -2000  . TIA (transient ischemic attack)   . Trigger finger of left hand    left long finger    Past Surgical History:  Procedure Laterality Date  . ABDOMINAL HYSTERECTOMY  2012  . COLONOSCOPY  10/2018  . CYSTOSCOPY WITH RETROGRADE PYELOGRAM, URETEROSCOPY AND STENT PLACEMENT Right 05/29/2012   Procedure: CYSTOSCOPY WITH RETROGRADE PYELOGRAM, URETEROSCOPY AND STENT PLACEMENT;  Surgeon: Hanley Ben, MD;  [facility];  Service: Urology;  Laterality: Right;  . HOLMIUM LASER APPLICATION Right 8/45/3646   Procedure: HOLMIUM LASER APPLICATION;  Surgeon: Hanley Ben, MD;  [facility];  Service: Urology;  Laterality: Right;  . TRIGGER FINGER RELEASE Left 09/14/2019   Procedure: LEFT LONG FINGER A-1 PULLEY RELEASE;  Surgeon: Newt Minion, MD;   Location: [facility];  Service: Orthopedics;  Laterality: Left;    There were no vitals filed for this visit.   Subjective Assessment - 02/02/20 1151    Subjective  Patient report 5/10 pain at surgical site    Currently in Pain? Yes    Pain Score 5     Pain Location Hand    Pain Orientation Left    Pain Descriptors / Indicators Aching;Burning    Pain Type Chronic pain    Pain Onset More than a month ago    Pain Frequency Constant                        OT Treatments/Exercises (OP) - 02/02/20 0001      Ultrasound   Ultrasound Location palm- left hand    Ultrasound Parameters 0.3, continuous, 0.8 w/cm2, x 84min    Ultrasound Goals Pain;Edema   scar management     LUE Fluidotherapy   Number Minutes Fluidotherapy 10 Minutes    LUE Fluidotherapy Location Hand;Wrist    Comments Prior to scar management, for pain reduction      Manual Therapy   Manual therapy comments scar management manual, and self scar management on massage ball  OT Short Term Goals - 01/05/20 1510      OT SHORT TERM GOAL #1   Title Patient will complete HEP independently due 01/28/20    Time 4    Period Weeks    Status On-going    [facility] Date 01/28/20      OT SHORT TERM GOAL #2   Title Patient will demonstrate 3lb increase in left grip strength    Time 4    Period Weeks    Status On-going      OT SHORT TERM GOAL #3   Title Patient will report pain no greater than 4/10 during desensitization techniques    Baseline 9/10    Time 4    Period Weeks    Status On-going             OT Long Term Goals - 01/05/20 1511      OT LONG TERM GOAL #1   Title Patient will complete desensitization, massage, and upgraded HEP I'ly due 02/27/20    Time 8    Period Weeks    Status On-going      OT LONG TERM GOAL #2   Title Patient will report ability to roll dumplings using BUE    Time 8    Period Weeks    Status On-going      OT LONG TERM GOAL  #3   Title Patient will demonstrate 8lb increase in left grip strength    Time 8    Period Weeks    Status On-going      OT LONG TERM GOAL #4   Title Patient will demonstrate 90% composite flexion and extension in LUE    Time 8    Period Weeks    Status On-going                 Plan - 02/02/20 1239    Clinical Impression Statement Patient has been seen by hand specialist who indicates need for continued aggressive scar management to prevent future surgery    OT Occupational Profile and History Problem Focused Assessment - Including review of records relating to presenting problem    Occupational performance deficits (Please refer to evaluation for details): ADL's;IADL's    Body Structure / Function / Physical Skills ADL;Coordination;Scar mobility;UE functional use;Sensation;Fascial restriction;IADL;Pain;Skin integrity;Wound;Strength;FMC;Dexterity;Edema;ROM    Rehab Potential Good    Clinical Decision Making Limited treatment options, no task modification necessary    Comorbidities Affecting Occupational Performance: May have comorbidities impacting occupational performance    Modification or Assistance to Complete Evaluation  No modification of tasks or assist necessary to complete eval    OT Frequency 1x / week    OT Duration 8 weeks    OT Treatment/Interventions Self-care/ADL training;Iontophoresis;Therapeutic exercise;Patient/family education;Splinting;Moist Heat;Fluidtherapy;Scar mobilization;Therapeutic activities;Passive range of motion;Manual Therapy;DME and/or AE instruction;Contrast Bath;Ultrasound;Paraffin;Electrical Stimulation;Cryotherapy    Plan Fluidotherapy, massage/ palm, desensitization, Ultrasound, gentle exercise    OT Home Exercise Plan composite flex/ext left UE    Consulted and Agree with Plan of Care Patient           Patient will benefit from skilled therapeutic intervention in order to improve the following deficits and impairments:   Body Structure  / Function / Physical Skills: ADL, Coordination, Scar mobility, UE functional use, Sensation, Fascial restriction, IADL, Pain, Skin integrity, Wound, Strength, FMC, Dexterity, Edema, ROM       Visit Diagnosis: Pain in left hand  Stiffness of left hand, not elsewhere classified  Other disturbances of skin sensation  Problem List Patient Active Problem List   Diagnosis Date Noted  . Diastasis recti 12/31/2019  . Irritable bowel syndrome with constipation 12/31/2019  . Trigger middle finger of left hand   . Grief reaction 01/23/2019  . Ventral hernia without obstruction or gangrene 01/23/2019  . Abnormal CT scan, stomach 01/23/2019  . Abdominal distention 01/23/2019  . Anxiety about health 12/15/2018  . Hx of colonic polyp - ssp 11/15/2018  . Rectocele 11/10/2018  . Carpal tunnel syndrome of right wrist 07/28/2018  . Pain of right upper extremity 06/16/2018  . Menopausal syndrome (hot flushes) 05/12/2018  . Type II diabetes mellitus, uncontrolled ([place]) 04/26/2018  . MGUS (monoclonal gammopathy of unknown significance) 02/05/2018  . Normocytic anemia 02/05/2018  . Thrombocytopenia ([place]) 02/05/2018  . Transient alteration of awareness 01/31/2017  . Chronic pain disorder 06/03/2016  . Diabetes mellitus type 2, controlled ([place]) 02/15/2014  . Neuropathy 10/19/2012  . Fibromyalgia 10/19/2012  . Fibromyositis 10/19/2012  . Headache 10/19/2012    Mariah Milling, OTR/L 02/02/2020, 12:41 PM  [facility] [address] [place], [place], [address] Phone: [phone]   Fax:  [phone]  Name: Nichole Garza MRN: [id number] Date of Birth: 05/11/1960

## 2020-02-08 ENCOUNTER — Ambulatory Visit: Payer: Medicare Other | Admitting: Occupational Therapy

## 2020-02-08 ENCOUNTER — Encounter: Payer: Self-pay | Admitting: Occupational Therapy

## 2020-02-08 ENCOUNTER — Other Ambulatory Visit: Payer: Self-pay

## 2020-02-08 DIAGNOSIS — M25642 Stiffness of left hand, not elsewhere classified: Secondary | ICD-10-CM

## 2020-02-08 DIAGNOSIS — R208 Other disturbances of skin sensation: Secondary | ICD-10-CM

## 2020-02-08 DIAGNOSIS — M79642 Pain in left hand: Secondary | ICD-10-CM | POA: Diagnosis not present

## 2020-02-08 NOTE — Therapy (Signed)
[facility] [address] [place], [place], [address] Phone: [phone]   Fax:  [phone]  Occupational Therapy Treatment  Patient Details  Name: Nichole Garza MRN: [id number] Date of Birth: 10/01/1960 Referring Provider (OT): Bevely Palmer Persons   Encounter Date: 02/08/2020   OT End of Session - 02/08/20 1232    Visit Number 6    Number of Visits 9    Authorization Type [employer] Medicare    Progress Note Due on Visit 10    OT Start Time 1145    OT Stop Time 1230    OT Time Calculation (min) 45 min    Activity Tolerance Patient tolerated treatment well    Behavior During Therapy Anxious           Past Medical History:  Diagnosis Date  . Anxiety   . Arthritis    in her spine  . Diabetes mellitus   . Fibromyalgia   . Heart murmur   . Hx of colonic polyp - ssp 11/15/2018   10/2018 diminutive sessile serrated polyp recall 2027 Gatha Mayer, MD, [facility]   . Hyperlipidemia   . Hypertension   . IBS (irritable bowel syndrome)   . Insomnia   . Kidney stone    kidney stone  . Migraine headache   . Nerve pain   . Rectocele 11/10/2018  . Stroke [facility])    TIA 20 -2000  . TIA (transient ischemic attack)   . Trigger finger of left hand    left long finger    Past Surgical History:  Procedure Laterality Date  . ABDOMINAL HYSTERECTOMY  2012  . COLONOSCOPY  10/2018  . CYSTOSCOPY WITH RETROGRADE PYELOGRAM, URETEROSCOPY AND STENT PLACEMENT Right 05/29/2012   Procedure: CYSTOSCOPY WITH RETROGRADE PYELOGRAM, URETEROSCOPY AND STENT PLACEMENT;  Surgeon: Hanley Ben, MD;  [facility];  Service: Urology;  Laterality: Right;  . HOLMIUM LASER APPLICATION Right 5/99/3570   Procedure: HOLMIUM LASER APPLICATION;  Surgeon: Hanley Ben, MD;  [facility];  Service: Urology;  Laterality: Right;  . TRIGGER FINGER RELEASE Left 09/14/2019   Procedure: LEFT LONG FINGER A-1 PULLEY RELEASE;  Surgeon: Newt Minion, MD;   Location: [facility];  Service: Orthopedics;  Laterality: Left;    There were no vitals filed for this visit.   Subjective Assessment - 02/08/20 1157    Subjective  It's not bad today - maybe 3/10 pain in palm    Currently in Pain? Yes    Pain Score 3     Pain Location Hand    Pain Orientation Left    Pain Type Chronic pain    Pain Onset More than a month ago    Pain Frequency Constant    Aggravating Factors  contact    Pain Relieving Factors heat                        OT Treatments/Exercises (OP) - 02/08/20 0001      ADLs   ADL Comments Patient still upset regarding triggering in ring finger, and scar pain.  Reminded patient to continue to use Oval 8 splint to prevent flexion ring finger.        Ultrasound   Ultrasound Location palm left hand    Ultrasound Parameters 65mz, continuous, 0.8w/cm2, x 8 min      LUE Fluidotherapy   Number Minutes Fluidotherapy 10 Minutes    LUE Fluidotherapy Location Hand;Wrist    Comments prior  to scar management      Manual Therapy   Manual therapy comments scar management as tolerated                    OT Short Term Goals - 02/08/20 1235      OT SHORT TERM GOAL #1   Title Patient will complete HEP independently due 01/28/20    Time 4    Period Weeks    Status Not Met    Target Date 01/28/20      OT SHORT TERM GOAL #2   Title Patient will demonstrate 3lb increase in left grip strength    Time 4    Period Weeks    Status Not Met      OT SHORT TERM GOAL #3   Title Patient will report pain no greater than 4/10 during desensitization techniques    Baseline 9/10    Time 4    Period Weeks    Status Achieved             OT Long Term Goals - 02/08/20 1235      OT LONG TERM GOAL #1   Title Patient will complete desensitization, massage, and upgraded HEP I'ly due 02/27/20    Time 8    Period Weeks    Status On-going      OT LONG TERM GOAL #2   Title Patient will report ability to  roll dumplings using BUE    Time 8    Period Weeks    Status On-going      OT LONG TERM GOAL #3   Title Patient will demonstrate 8lb increase in left grip strength    Time 8    Period Weeks    Status On-going      OT LONG TERM GOAL #4   Title Patient will demonstrate 90% composite flexion and extension in LUE    Time 8    Period Weeks    Status On-going                 Plan - 02/08/20 1232    Clinical Impression Statement Patient tolerraing scar management, and is really hopeful to not need additional surgery.    OT Occupational Profile and History Problem Focused Assessment - Including review of records relating to presenting problem    Occupational performance deficits (Please refer to evaluation for details): ADL's;IADL's    Body Structure / Function / Physical Skills ADL;Coordination;Scar mobility;UE functional use;Sensation;Fascial restriction;IADL;Pain;Skin integrity;Wound;Strength;FMC;Dexterity;Edema;ROM    Rehab Potential Good    Clinical Decision Making Limited treatment options, no task modification necessary    Comorbidities Affecting Occupational Performance: May have comorbidities impacting occupational performance    Modification or Assistance to Complete Evaluation  No modification of tasks or assist necessary to complete eval    OT Frequency 1x / week    OT Duration 8 weeks    OT Treatment/Interventions Self-care/ADL training;Iontophoresis;Therapeutic exercise;Patient/family education;Splinting;Moist Heat;Fluidtherapy;Scar mobilization;Therapeutic activities;Passive range of motion;Manual Therapy;DME and/or AE instruction;Contrast Bath;Ultrasound;Paraffin;Electrical Stimulation;Cryotherapy    Plan Fluidotherapy, massage/ palm, desensitization, Ultrasound, gentle exercise    OT Home Exercise Plan composite flex/ext left UE    Consulted and Agree with Plan of Care Patient           Patient will benefit from skilled therapeutic intervention in order to  improve the following deficits and impairments:   Body Structure / Function / Physical Skills: ADL, Coordination, Scar mobility, UE functional use, Sensation, Fascial restriction, IADL, Pain, Skin integrity, Wound,  Strength, FMC, Dexterity, Edema, ROM       Visit Diagnosis: Pain in left hand  Stiffness of left hand, not elsewhere classified  Other disturbances of skin sensation    Problem List Patient Active Problem List   Diagnosis Date Noted  . Diastasis recti 12/31/2019  . Irritable bowel syndrome with constipation 12/31/2019  . Trigger middle finger of left hand   . Grief reaction 01/23/2019  . Ventral hernia without obstruction or gangrene 01/23/2019  . Abnormal CT scan, stomach 01/23/2019  . Abdominal distention 01/23/2019  . Anxiety about health 12/15/2018  . Hx of colonic polyp - ssp 11/15/2018  . Rectocele 11/10/2018  . Carpal tunnel syndrome of right wrist 07/28/2018  . Pain of right upper extremity 06/16/2018  . Menopausal syndrome (hot flushes) 05/12/2018  . Type II diabetes mellitus, uncontrolled ([place]) 04/26/2018  . MGUS (monoclonal gammopathy of unknown significance) 02/05/2018  . Normocytic anemia 02/05/2018  . Thrombocytopenia ([place]) 02/05/2018  . Transient alteration of awareness 01/31/2017  . Chronic pain disorder 06/03/2016  . Diabetes mellitus type 2, controlled ([place]) 02/15/2014  . Neuropathy 10/19/2012  . Fibromyalgia 10/19/2012  . Fibromyositis 10/19/2012  . Headache 10/19/2012    Mariah Milling, OTR/L 02/08/2020, 12:37 PM  [facility] [address] [place], [place], [address] Phone: [phone]   Fax:  [phone]  Name: Jadis Pitter MRN: [id number] Date of Birth: 06-12-1960

## 2020-02-10 ENCOUNTER — Ambulatory Visit
Admission: RE | Admit: 2020-02-10 | Discharge: 2020-02-10 | Disposition: A | Discharge status: Home / Self Care | Payer: Medicare Other | Source: Ambulatory Visit | Attending: Nephrology | Admitting: Nephrology

## 2020-02-10 ENCOUNTER — Other Ambulatory Visit: Payer: Self-pay

## 2020-02-10 DIAGNOSIS — N183 Chronic kidney disease, stage 3 unspecified: Secondary | ICD-10-CM

## 2020-02-14 ENCOUNTER — Ambulatory Visit (INDEPENDENT_AMBULATORY_CARE_PROVIDER_SITE_OTHER): Payer: Medicare Other | Admitting: Physician Assistant

## 2020-02-14 ENCOUNTER — Encounter: Payer: Self-pay | Admitting: Orthopedic Surgery

## 2020-02-14 VITALS — Ht 60.0 in | Wt 112.0 lb

## 2020-02-14 DIAGNOSIS — M79641 Pain in right hand: Secondary | ICD-10-CM

## 2020-02-14 NOTE — Progress Notes (Signed)
Office Visit Note   Patient: Nichole Garza           Date of Birth: [birth year]           MRN: [id number] Visit Date: 02/14/2020              Requested by: No referring provider defined for this encounter. PCP: Pcp, No  Chief Complaint  Patient presents with  . Left Hand - Follow-up    Left long finger pulley release 09/14/2019      HPI: This is a pleasant woman who is status post A1 pulley release in June.  She has had difficulty with scarring in the left palm of her left hand.  She has been working with physical therapy which she says has been helpful.  She still cannot make a fist secondary to the pain and scarring.  She did have a consultation with Dr. Burney Gauze who stated that after 5 months if she was no better he would recommend repeat surgery.  She is going to see him in a couple weeks Assessment & Plan: Visit Diagnoses: No diagnosis found.  Plan: Continue with physical therapy we will await Dr. Bertis Ruddy recommendations.  Follow-Up Instructions: No follow-ups on file.   Ortho Exam  Patient is alert, oriented, no adenopathy, well-dressed, normal affect, normal respiratory effort. Focused examination demonstrates appropriate for incision.  No mild soft tissue swelling no cellulitis.  She does have decreased grip strength because she cannot close her hand secondary to scarring.  She moves all fingers fairly easily.  Improved from last time.  No cellulitis or signs of infection  Imaging: No results found. No images are attached to the encounter.  Labs: Lab Results  Component Value Date   HGBA1C 7.9 (H) 02/14/2014   HGBA1C 6.6 (H) 10/19/2012   ESRSEDRATE 22 10/20/2012   REPTSTATUS 02/21/2014 FINAL 02/14/2014   CULT  02/14/2014    NO GROWTH 5 DAYS Performed at [facility]      Lab Results  Component Value Date   ALBUMIN 4.6 11/26/2019   ALBUMIN 4.6 11/26/2018   ALBUMIN 4.9 05/26/2018    No results found for: MG Lab Results  Component Value  Date   VD25OH 52.9 02/05/2018    No results found for: PREALBUMIN CBC EXTENDED Latest Ref Rng & Units 11/26/2019 11/26/2018 05/26/2018  WBC 4.0 - 10.5 K/uL 5.5 6.1 10.8(H)  RBC 3.87 - 5.11 MIL/uL 4.47 4.48 4.59  HGB 12.0 - 15.0 g/dL 12.3 12.5 12.6  HCT 36 - 46 % 38.2 39.5 40.1  PLT 150 - 400 K/uL 306 314 304  NEUTROABS 1.7 - 7.7 K/uL 2.4 2.8 5.8  LYMPHSABS 0.7 - 4.0 K/uL 2.7 2.9 4.3(H)     Body mass index is 21.87 kg/m.  Orders:  No orders of the defined types were placed in this encounter.  No orders of the defined types were placed in this encounter.    Procedures: No procedures performed  Clinical Data: No additional findings.  ROS:  All other systems negative, except as noted in the HPI. Review of Systems  Objective: Vital Signs: Ht 5' (1.524 m)   Wt 112 lb (50.8 kg)   BMI 21.87 kg/m   Specialty Comments:  No specialty comments available.  PMFS History: Patient Active Problem List   Diagnosis Date Noted  . Diastasis recti 12/31/2019  . Irritable bowel syndrome with constipation 12/31/2019  . Trigger middle finger of left hand   . Grief reaction 01/23/2019  . Ventral hernia  without obstruction or gangrene 01/23/2019  . Abnormal CT scan, stomach 01/23/2019  . Abdominal distention 01/23/2019  . Anxiety about health 12/15/2018  . Hx of colonic polyp - ssp 11/15/2018  . Rectocele 11/10/2018  . Carpal tunnel syndrome of right wrist 07/28/2018  . Pain of right upper extremity 06/16/2018  . Menopausal syndrome (hot flushes) 05/12/2018  . Type II diabetes mellitus, uncontrolled ([place]) 04/26/2018  . MGUS (monoclonal gammopathy of unknown significance) 02/05/2018  . Normocytic anemia 02/05/2018  . Thrombocytopenia ([place]) 02/05/2018  . Transient alteration of awareness 01/31/2017  . Chronic pain disorder 06/03/2016  . Diabetes mellitus type 2, controlled ([place]) 02/15/2014  . Neuropathy 10/19/2012  . Fibromyalgia 10/19/2012  . Fibromyositis 10/19/2012  .  Headache 10/19/2012   Past Medical History:  Diagnosis Date  . Anxiety   . Arthritis    in her spine  . Diabetes mellitus   . Fibromyalgia   . Heart murmur   . Hx of colonic polyp - ssp 11/15/2018   10/2018 diminutive sessile serrated polyp recall 2027 Gatha Mayer, MD, [facility]   . Hyperlipidemia   . Hypertension   . IBS (irritable bowel syndrome)   . Insomnia   . Kidney stone    kidney stone  . Migraine headache   . Nerve pain   . Rectocele 11/10/2018  . Stroke [facility])    TIA 20 -2000  . TIA (transient ischemic attack)   . Trigger finger of left hand    left long finger    Family History  Problem Relation Age of Onset  . Diabetes Mother   . Diabetes Father   . Cancer Brother   . Colon cancer Neg Hx   . Esophageal cancer Neg Hx   . Stomach cancer Neg Hx   . Rectal cancer Neg Hx     Past Surgical History:  Procedure Laterality Date  . ABDOMINAL HYSTERECTOMY  2012  . COLONOSCOPY  10/2018  . CYSTOSCOPY WITH RETROGRADE PYELOGRAM, URETEROSCOPY AND STENT PLACEMENT Right 05/29/2012   Procedure: CYSTOSCOPY WITH RETROGRADE PYELOGRAM, URETEROSCOPY AND STENT PLACEMENT;  Surgeon: Hanley Ben, MD;  [facility];  Service: Urology;  Laterality: Right;  . HOLMIUM LASER APPLICATION Right 0/71/2197   Procedure: HOLMIUM LASER APPLICATION;  Surgeon: Hanley Ben, MD;  [facility];  Service: Urology;  Laterality: Right;  . TRIGGER FINGER RELEASE Left 09/14/2019   Procedure: LEFT LONG FINGER A-1 PULLEY RELEASE;  Surgeon: Newt Minion, MD;  Location: [facility];  Service: Orthopedics;  Laterality: Left;   Social History   Occupational History  . Occupation: disability    Comment: finromyalgia. neuropathy  Tobacco Use  . Smoking status: Never Smoker  . Smokeless tobacco: Never Used  Vaping Use  . Vaping Use: Never used  Substance and Sexual Activity  . Alcohol use: No  . Drug use: No  . Sexual activity: Yes    Birth control/protection: Surgical

## 2020-02-15 ENCOUNTER — Ambulatory Visit: Payer: Medicare Other | Admitting: Occupational Therapy

## 2020-02-22 ENCOUNTER — Ambulatory Visit: Payer: Medicare Other | Admitting: Occupational Therapy

## 2020-03-09 ENCOUNTER — Encounter: Payer: Self-pay | Admitting: Occupational Therapy

## 2020-03-09 NOTE — Therapy (Signed)
[facility] [address] [place], [place], [address] Phone: [phone]   Fax:  [phone]  Occupational Therapy Discharge Summary  Patient Details  Name: Nichole Garza MRN: [id number] Date of Birth: Oct 26, 1960 Referring Provider (OT): Bevely Palmer Persons   Encounter Date: 03/09/2020    Past Medical History:  Diagnosis Date  . Anxiety   . Arthritis    in her spine  . Diabetes mellitus   . Fibromyalgia   . Heart murmur   . Hx of colonic polyp - ssp 11/15/2018   10/2018 diminutive sessile serrated polyp recall 2027 Gatha Mayer, MD, [facility]   . Hyperlipidemia   . Hypertension   . IBS (irritable bowel syndrome)   . Insomnia   . Kidney stone    kidney stone  . Migraine headache   . Nerve pain   . Rectocele 11/10/2018  . Stroke [facility])    TIA 20 -2000  . TIA (transient ischemic attack)   . Trigger finger of left hand    left long finger    Past Surgical History:  Procedure Laterality Date  . ABDOMINAL HYSTERECTOMY  2012  . COLONOSCOPY  10/2018  . CYSTOSCOPY WITH RETROGRADE PYELOGRAM, URETEROSCOPY AND STENT PLACEMENT Right 05/29/2012   Procedure: CYSTOSCOPY WITH RETROGRADE PYELOGRAM, URETEROSCOPY AND STENT PLACEMENT;  Surgeon: Hanley Ben, MD;  [facility];  Service: Urology;  Laterality: Right;  . HOLMIUM LASER APPLICATION Right 8/56/3149   Procedure: HOLMIUM LASER APPLICATION;  Surgeon: Hanley Ben, MD;  [facility];  Service: Urology;  Laterality: Right;  . TRIGGER FINGER RELEASE Left 09/14/2019   Procedure: LEFT LONG FINGER A-1 PULLEY RELEASE;  Surgeon: Newt Minion, MD;  Location: [facility];  Service: Orthopedics;  Laterality: Left;    There were no vitals filed for this visit.                           OT Short Term Goals - 02/08/20 1235      OT SHORT TERM GOAL #1   Title Patient will complete HEP independently due 01/28/20    Time 4     Period Weeks    Status Not Met    Target Date 01/28/20      OT SHORT TERM GOAL #2   Title Patient will demonstrate 3lb increase in left grip strength    Time 4    Period Weeks    Status Not Met      OT SHORT TERM GOAL #3   Title Patient will report pain no greater than 4/10 during desensitization techniques    Baseline 9/10    Time 4    Period Weeks    Status Achieved             OT Long Term Goals - 02/08/20 1235      OT LONG TERM GOAL #1   Title Patient will complete desensitization, massage, and upgraded HEP I'ly due 02/27/20    Time 8    Period Weeks    Status On-going      OT LONG TERM GOAL #2   Title Patient will report ability to roll dumplings using BUE    Time 8    Period Weeks    Status On-going      OT LONG TERM GOAL #3   Title Patient will demonstrate 8lb increase in left grip strength    Time 8    Period Weeks  Status On-going      OT LONG TERM GOAL #4   Title Patient will demonstrate 90% composite flexion and extension in LUE    Time 8    Period Weeks    Status On-going                  Patient will benefit from skilled therapeutic intervention in order to improve the following deficits and impairments:           Visit Diagnosis: No diagnosis found.    Problem List Patient Active Problem List   Diagnosis Date Noted  . Diastasis recti 12/31/2019  . Irritable bowel syndrome with constipation 12/31/2019  . Trigger middle finger of left hand   . Grief reaction 01/23/2019  . Ventral hernia without obstruction or gangrene 01/23/2019  . Abnormal CT scan, stomach 01/23/2019  . Abdominal distention 01/23/2019  . Anxiety about health 12/15/2018  . Hx of colonic polyp - ssp 11/15/2018  . Rectocele 11/10/2018  . Carpal tunnel syndrome of right wrist 07/28/2018  . Pain of right upper extremity 06/16/2018  . Menopausal syndrome (hot flushes) 05/12/2018  . Type II diabetes mellitus, uncontrolled ([place]) 04/26/2018  . MGUS  (monoclonal gammopathy of unknown significance) 02/05/2018  . Normocytic anemia 02/05/2018  . Thrombocytopenia ([place]) 02/05/2018  . Transient alteration of awareness 01/31/2017  . Chronic pain disorder 06/03/2016  . Diabetes mellitus type 2, controlled ([place]) 02/15/2014  . Neuropathy 10/19/2012  . Fibromyalgia 10/19/2012  . Fibromyositis 10/19/2012  . Headache 10/19/2012   OCCUPATIONAL THERAPY DISCHARGE SUMMARY  Visits from Start of Care: 6  Current functional level related to goals / functional outcomes: Patient with continued pain and stiffness in palm of hand.  Patient consulting with additional surgeon to address new trigger finger.  Patient did not return for final OT visits , was planning trip out of the country.       Education / Equipment: HEP - Massage/desensitization techniques Plan: Patient agrees to discharge.  Patient goals were not met. Patient is being discharged due to not returning since the last visit.  ?????     Mariah Milling, OTR/L  03/09/2020, 1:25 PM  [facility] [address] [place], [place], [address] Phone: [phone]   Fax:  [phone]  Name: Velna Hedgecock MRN: [id number] Date of Birth: 06-29-1960

## 2020-03-14 ENCOUNTER — Telehealth: Payer: Self-pay | Admitting: Orthopedic Surgery

## 2020-03-14 NOTE — Telephone Encounter (Signed)
Forwarding medical records request to [facility] today

## 2020-09-13 ENCOUNTER — Other Ambulatory Visit (HOSPITAL_BASED_OUTPATIENT_CLINIC_OR_DEPARTMENT_OTHER): Payer: Self-pay

## 2020-09-18 ENCOUNTER — Encounter: Payer: Self-pay | Admitting: Orthopedic Surgery

## 2020-09-18 ENCOUNTER — Ambulatory Visit: Payer: Medicare Other | Admitting: Orthopedic Surgery

## 2020-09-18 DIAGNOSIS — M20032 Swan-neck deformity of left finger(s): Secondary | ICD-10-CM | POA: Diagnosis not present

## 2020-09-18 DIAGNOSIS — M65332 Trigger finger, left middle finger: Secondary | ICD-10-CM | POA: Diagnosis not present

## 2020-09-18 NOTE — Progress Notes (Signed)
Office Visit Note   Patient: Nichole Garza           Date of Birth: [birth year]           MRN: [id number] Visit Date: 09/18/2020              Requested by: No referring provider defined for this encounter. PCP: Pcp, No  Chief Complaint  Patient presents with   Left Hand - Pain    1 yr s/p A1 pulley release long finger       HPI: Patient is a 60 year old woman who is 1 year status post left long finger A1 pulley release.  She has full range of motion of the index and long finger but is having decreased range of motion of her ring and little finger in the left hand.  Patient states that she is having advanced neurologic changes secondary to her fibromyalgia.  She states that she is having some neurologic changes with her left upper extremity and she states that her memory has been affected.  Patient states that she is left her car running without remembering to turn off the car or locked the car.  Assessment & Plan: Visit Diagnoses:  1. 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 Trigger finger, left middle finger     Plan: We will make a consult with hand surgery to see if there are options available for the swan-neck deformity to the ring and little finger left hand, she has undergone bracing of the PIP joint ,ring finger, with hand therapy without resolution.  Follow-Up Instructions: Return if symptoms worsen or fail to improve.   Ortho Exam  Patient is alert, oriented, no adenopathy, well-dressed, normal affect, normal respiratory effort. Examination patient has decreased range of motion of the fingers in her left hand compared to the right hand.  Passively she has full range of motion of the index and long finger.  The ring finger and little finger have a swan-neck deformity with a fixed extension of the PIP joint and a fixed flexion of the DIP joint worse on the ring than little finger.  With flexion patient's fingers are 3 fingerbreadths from the distal palmar  crease.  Patient does have increased atrophy in the left hand compared to the right.  Imaging: No results found. No images are attached to the encounter.  Labs: Lab Results  Component Value Date   HGBA1C 7.9 (H) 02/14/2014   HGBA1C 6.6 (H) 10/19/2012   ESRSEDRATE 22 10/20/2012   REPTSTATUS 02/21/2014 FINAL 02/14/2014   CULT  02/14/2014    NO GROWTH 5 DAYS Performed at [facility]      Lab Results  Component Value Date   ALBUMIN 4.6 11/26/2019   ALBUMIN 4.6 11/26/2018   ALBUMIN 4.9 05/26/2018    No results found for: MG Lab Results  Component Value Date   VD25OH 52.9 02/05/2018    No results found for: PREALBUMIN CBC EXTENDED Latest Ref Rng & Units 11/26/2019 11/26/2018 05/26/2018  WBC 4.0 - 10.5 K/uL 5.5 6.1 10.8(H)  RBC 3.87 - 5.11 MIL/uL 4.47 4.48 4.59  HGB 12.0 - 15.0 g/dL 12.3 12.5 12.6  HCT 36.0 - 46.0 % 38.2 39.5 40.1  PLT 150 - 400 K/uL 306 314 304  NEUTROABS 1.7 - 7.7 K/uL 2.4 2.8 5.8  LYMPHSABS 0.7 - 4.0 K/uL 2.7 2.9 4.3(H)     There is no height or weight on file to calculate BMI.  Orders:  No orders of  the defined types were placed in this encounter.  No orders of the defined types were placed in this encounter.    Procedures: No procedures performed  Clinical Data: No additional findings.  ROS:  All other systems negative, except as noted in the HPI. Review of Systems  Objective: Vital Signs: There were no vitals taken for this visit.  Specialty Comments:  No specialty comments available.  PMFS History: Patient Active Problem List   Diagnosis Date Noted   Diastasis recti 12/31/2019   Irritable bowel syndrome with constipation 12/31/2019   Trigger middle finger of left hand    Grief reaction 01/23/2019   Ventral hernia without obstruction or gangrene 01/23/2019   Abnormal CT scan, stomach 01/23/2019   Abdominal distention 01/23/2019   Anxiety about health 12/15/2018   Hx of colonic polyp - ssp 11/15/2018   Rectocele  11/10/2018   Carpal tunnel syndrome of right wrist 07/28/2018   Pain of right upper extremity 06/16/2018   Menopausal syndrome (hot flushes) 05/12/2018   Type II diabetes mellitus, uncontrolled ([name]) 04/26/2018   MGUS (monoclonal gammopathy of unknown significance) 02/05/2018   Normocytic anemia 02/05/2018   Thrombocytopenia ([place]) 02/05/2018   Transient alteration of awareness 01/31/2017   Chronic pain disorder 06/03/2016   Diabetes mellitus type 2, controlled ([place]) 02/15/2014   Neuropathy 10/19/2012   Fibromyalgia 10/19/2012   Fibromyositis 10/19/2012   Headache 10/19/2012   Past Medical History:  Diagnosis Date   Anxiety    Arthritis    in her spine   Diabetes mellitus    Fibromyalgia    Heart murmur    Hx of colonic polyp - ssp 11/15/2018   10/2018 diminutive sessile serrated polyp recall 2027 Gatha Mayer, MD, [facility]    Hyperlipidemia    Hypertension    IBS (irritable bowel syndrome)    Insomnia    Kidney stone    kidney stone   Migraine headache    Nerve pain    Rectocele 11/10/2018   Stroke ([place])    TIA 20 -2000   TIA (transient ischemic attack)    Trigger finger of left hand    left long finger    Family History  Problem Relation Age of Onset   Diabetes Mother    Diabetes Father    Cancer Brother    Colon cancer Neg Hx    Esophageal cancer Neg Hx    Stomach cancer Neg Hx    Rectal cancer Neg Hx     Past Surgical History:  Procedure Laterality Date   ABDOMINAL HYSTERECTOMY  2012   COLONOSCOPY  10/2018   CYSTOSCOPY WITH RETROGRADE PYELOGRAM, URETEROSCOPY AND STENT PLACEMENT Right 05/29/2012   Procedure: CYSTOSCOPY WITH RETROGRADE PYELOGRAM, URETEROSCOPY AND STENT PLACEMENT;  Surgeon: Hanley Ben, MD;  Location: [facility];  Service: Urology;  Laterality: Right;   HOLMIUM LASER APPLICATION Right 6/96/2952   Procedure: HOLMIUM LASER APPLICATION;  Surgeon: Hanley Ben, MD;  Location: [facility];  Service: Urology;  Laterality: Right;   TRIGGER FINGER RELEASE  Left 09/14/2019   Procedure: LEFT LONG FINGER A-1 PULLEY RELEASE;  Surgeon: Newt Minion, MD;  Location: [facility];  Service: Orthopedics;  Laterality: Left;   Social History   Occupational History   Occupation: disability    Comment: finromyalgia. neuropathy  Tobacco Use   Smoking status: Never   Smokeless tobacco: Never  Vaping Use   Vaping Use: Never used  Substance and Sexual Activity   Alcohol use: No   Drug use:  No   Sexual activity: Yes    Birth control/protection: Surgical

## 2020-11-24 ENCOUNTER — Ambulatory Visit: Payer: Medicare Other | Admitting: Family

## 2020-11-24 ENCOUNTER — Inpatient Hospital Stay: Payer: Medicare Other | Attending: Family | Admitting: Family

## 2020-11-24 ENCOUNTER — Other Ambulatory Visit: Payer: Medicare Other

## 2020-11-24 ENCOUNTER — Inpatient Hospital Stay: Payer: Medicare Other

## 2020-11-24 ENCOUNTER — Other Ambulatory Visit: Payer: Self-pay | Admitting: Family

## 2020-11-24 DIAGNOSIS — D472 Monoclonal gammopathy: Secondary | ICD-10-CM

## 2022-08-05 IMAGING — CT CT ABD-PELV W/O CM
1 of 2 series · 14 of 32 positions shown, 19 images · non-contrast
Comparison: CT abdomen pelvis dated 12/29/2018.

CLINICAL DATA: 59-year-old female with chronic kidney disease and
bloating. History of kidney stones.

EXAM:
CT ABDOMEN AND PELVIS WITHOUT CONTRAST
TECHNIQUE: Multidetector CT imaging of the abdomen and pelvis was performed
following the standard protocol without IV contrast.

[Series 2: abd/pelvis w/(date) · axial · 0.67mm/px · z∈[-333,+17]mm · 14 of 82 slices shown, 19 images]
[im 6/82  soft-tissue]
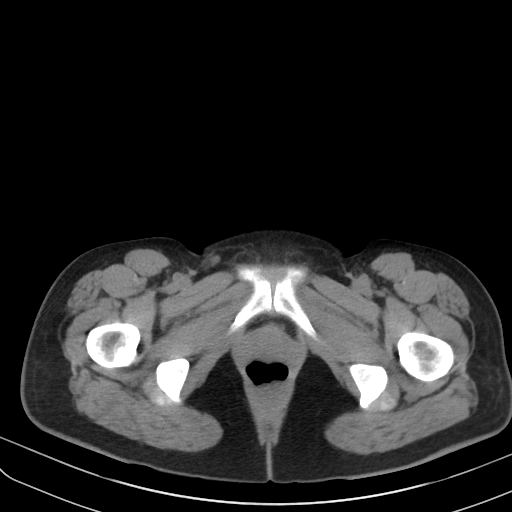
[im 6/82  bone]
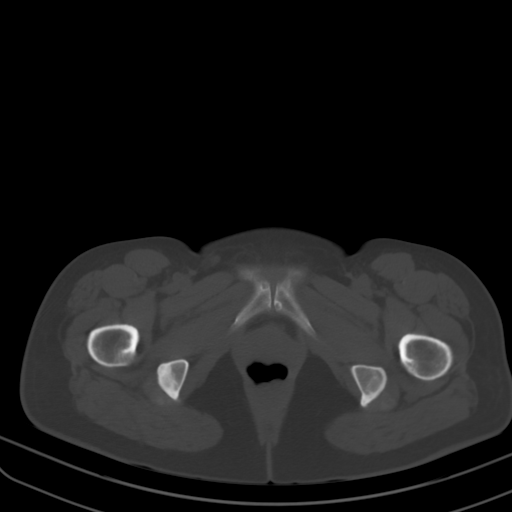
[im 11/82  soft-tissue]
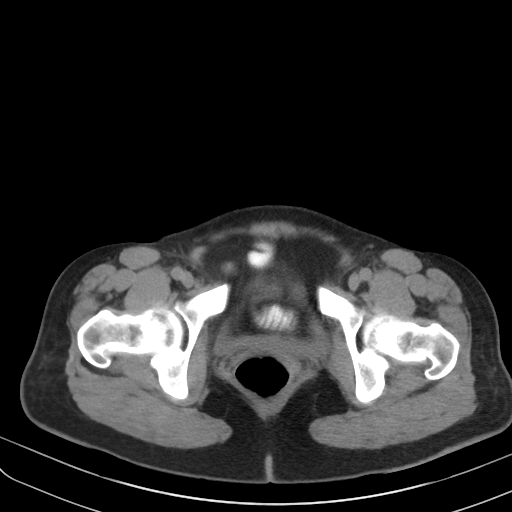
[im 16/82  soft-tissue]
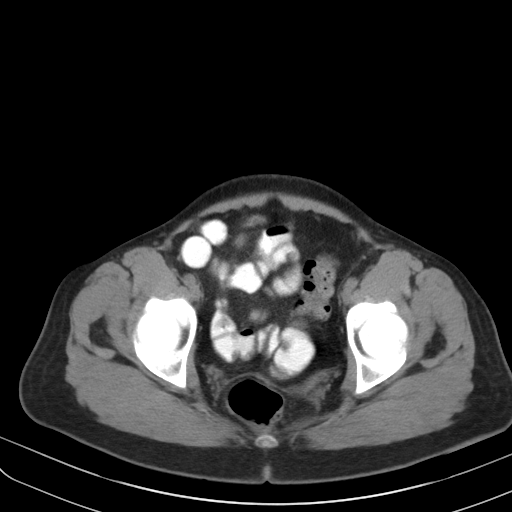
[im 26/82  soft-tissue]
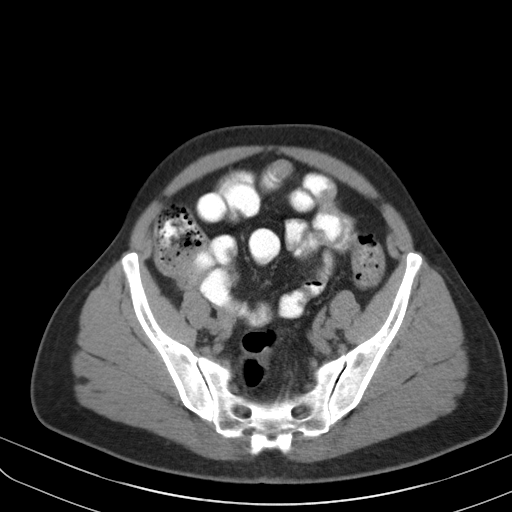
[im 31/82  soft-tissue]
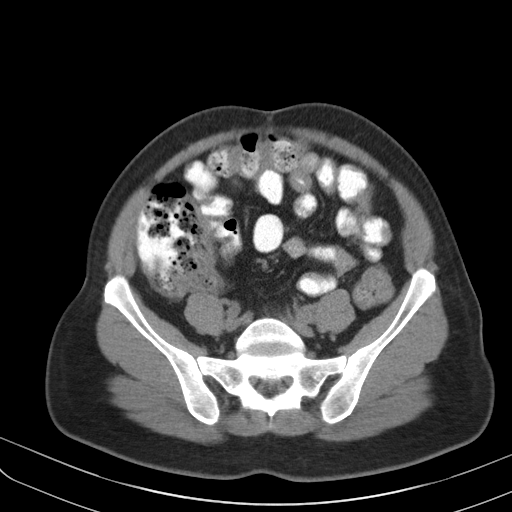
[im 36/82  soft-tissue]
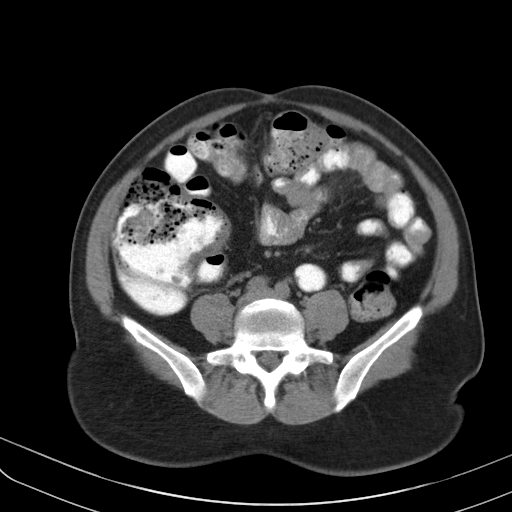
[im 41/82  soft-tissue]
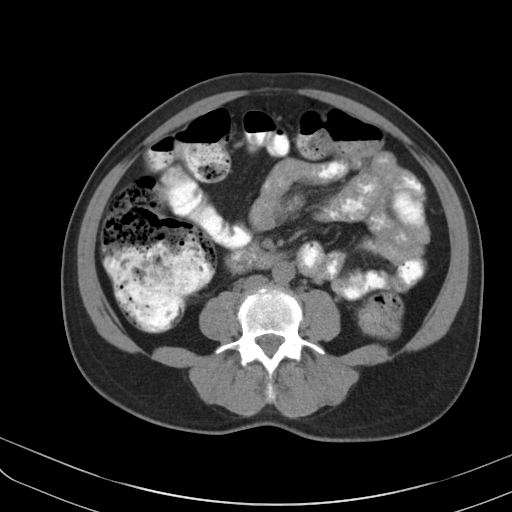
[im 46/82  soft-tissue]
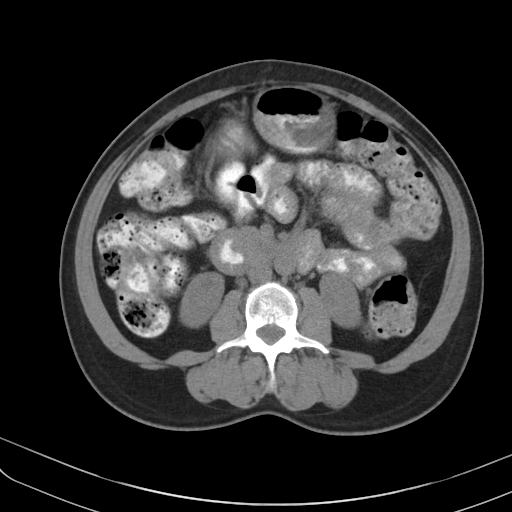
[im 51/82  soft-tissue]
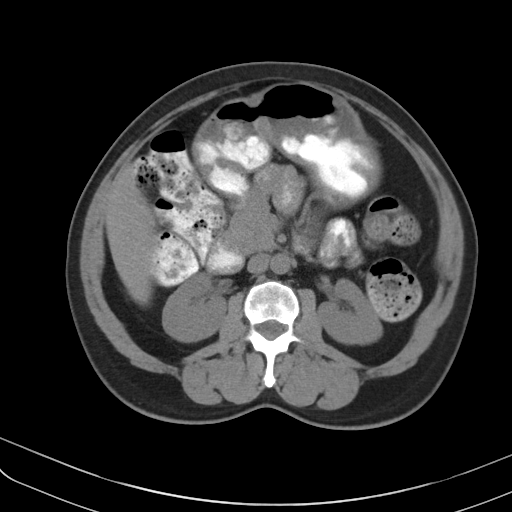
[im 51/82  bone]
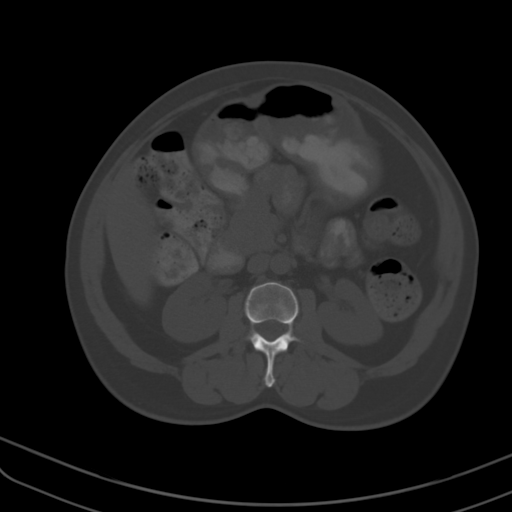
[im 56/82  soft-tissue]
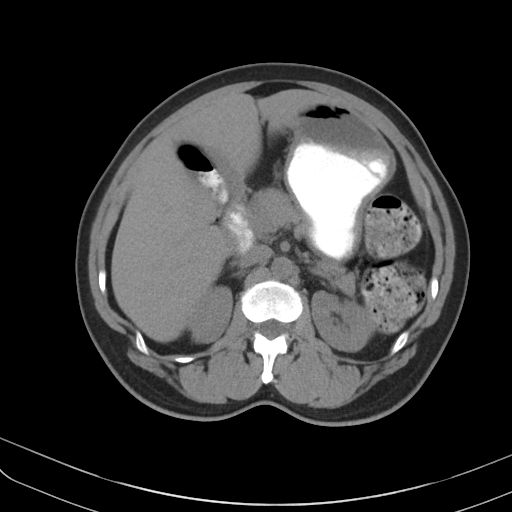
[im 61/82  lung]
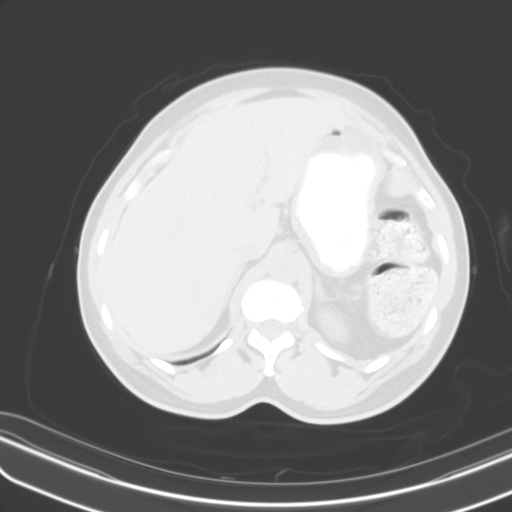
[im 66/82  soft-tissue]
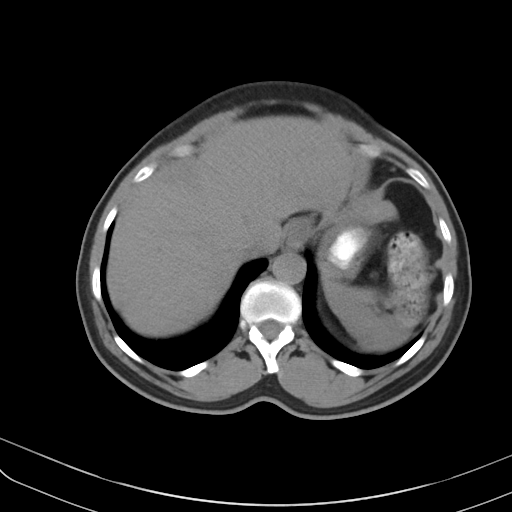
[im 66/82  lung]
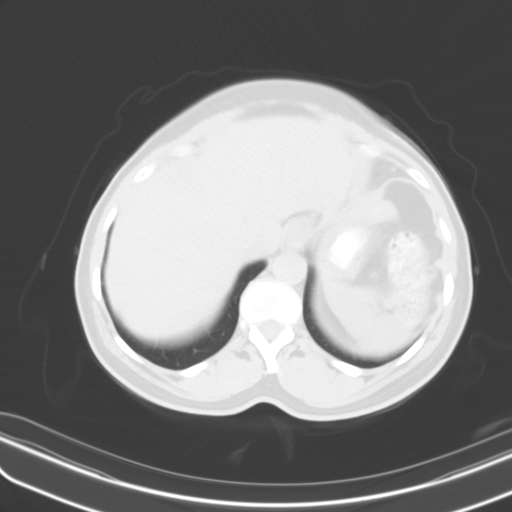
[im 71/82  soft-tissue]
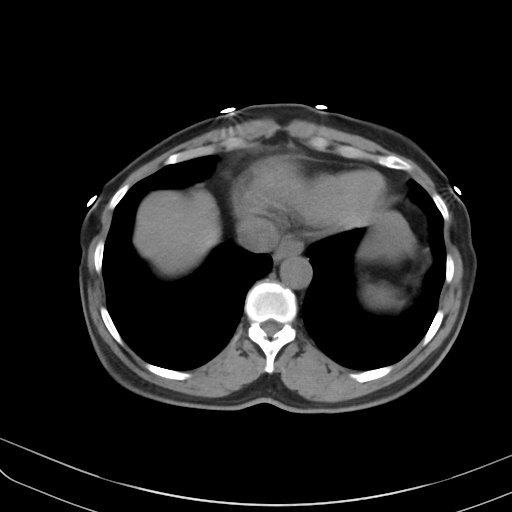
[im 71/82  lung]
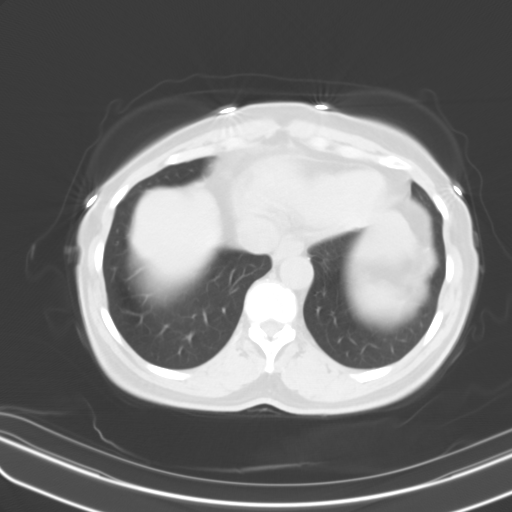
[im 76/82  soft-tissue]
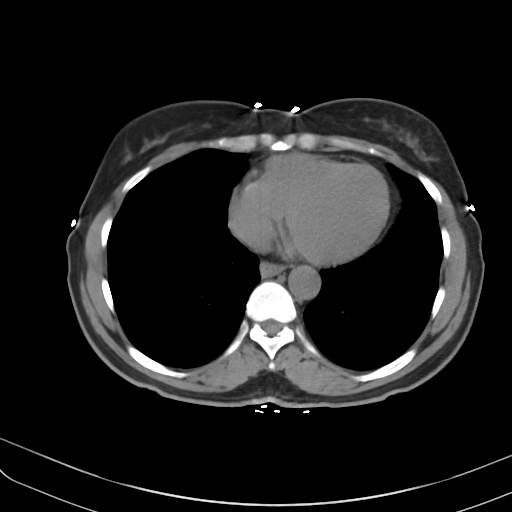
[im 76/82  lung]
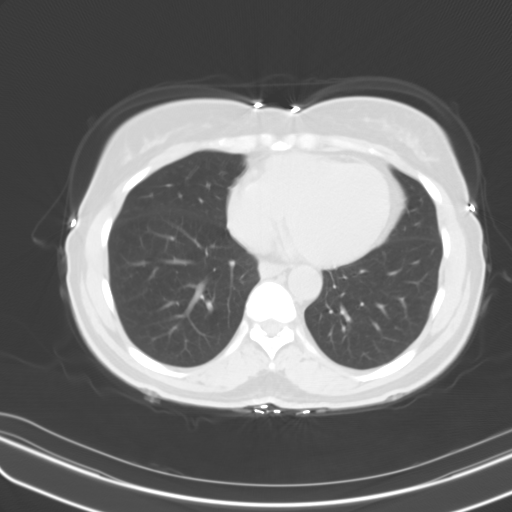

[14 of 32 positions shown; findings below may reference images not displayed]

FINDINGS: Evaluation of this exam is limited in the absence of intravenous
contrast.

Lower chest: The visualized lung bases are clear.

No intra-abdominal free air or free fluid.

Hepatobiliary: The liver is unremarkable. No intrahepatic biliary
ductal dilatation. The gallbladder is unremarkable.

Pancreas: Unremarkable. No pancreatic ductal dilatation or
surrounding inflammatory changes.

Spleen: Normal in size without focal abnormality.

Adrenals/Urinary Tract: The adrenal glands unremarkable. The
kidneys, visualized ureters, appear unremarkable. The urinary
bladder is collapsed.

Stomach/Bowel: Mild jejunal thickening, likely related to
underdistention. Enteritis is less likely. Clinical correlation is
recommended. There is no bowel obstruction. The appendix is normal.

Vascular/Lymphatic: The abdominal aorta and IVC are unremarkable. No
portal venous gas. There is no adenopathy.

Reproductive: Hysterectomy. No adnexal masses.

Other: Tiny fat containing supraumbilical hernia. No fluid
collection or inflammation.

Musculoskeletal: No acute or significant osseous findings.
IMPRESSION: 1. No hydronephrosis or nephrolithiasis.
2. Underdistention small-bowel versus less likely mild enteritis.
Clinical correlation is recommended. No bowel obstruction. Normal
appendix.

## 2022-10-05 LAB — AMB RESULTS CONSOLE CBG: Glucose: 210

## 2022-10-05 NOTE — Progress Notes (Signed)
Pt has PCP. Pt taking prednisone.
